# Patient Record
Sex: Female | Born: 1995 | Race: Black or African American | Hispanic: No | Marital: Single | Smoking: Never smoker
Health system: Southern US, Community
[De-identification: ages and names within clinical notes are randomized; demographics above are authoritative.]

## PROBLEM LIST (undated history)

## (undated) DIAGNOSIS — K219 Gastro-esophageal reflux disease without esophagitis: Secondary | ICD-10-CM

## (undated) DIAGNOSIS — F419 Anxiety disorder, unspecified: Secondary | ICD-10-CM

## (undated) HISTORY — DX: Gastro-esophageal reflux disease without esophagitis: K21.9

---
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
## 2018-08-22 ENCOUNTER — Other Ambulatory Visit (HOSPITAL_COMMUNITY)
Admission: RE | Admit: 2018-08-22 | Discharge: 2018-08-22 | Disposition: A | Discharge status: Home / Self Care | Payer: Medicaid Other | Source: Ambulatory Visit | Attending: Obstetrics and Gynecology | Admitting: Obstetrics and Gynecology

## 2018-08-22 ENCOUNTER — Other Ambulatory Visit: Payer: Self-pay | Admitting: Obstetrics and Gynecology

## 2018-08-22 DIAGNOSIS — Z01419 Encounter for gynecological examination (general) (routine) without abnormal findings: Secondary | ICD-10-CM | POA: Diagnosis not present

## 2018-08-24 LAB — CYTOLOGY - PAP
Adequacy: ABSENT
Diagnosis: NEGATIVE

## 2020-09-26 ENCOUNTER — Other Ambulatory Visit: Payer: Self-pay | Admitting: Family Medicine

## 2020-09-26 ENCOUNTER — Ambulatory Visit
Admission: RE | Admit: 2020-09-26 | Discharge: 2020-09-26 | Disposition: A | Discharge status: Home / Self Care | Payer: Medicaid Other | Source: Ambulatory Visit | Attending: Family Medicine | Admitting: Family Medicine

## 2020-09-26 DIAGNOSIS — M25572 Pain in left ankle and joints of left foot: Secondary | ICD-10-CM

## 2020-10-13 ENCOUNTER — Ambulatory Visit: Payer: Medicaid Other

## 2020-10-13 ENCOUNTER — Telehealth: Payer: Self-pay | Admitting: Podiatry

## 2020-10-13 ENCOUNTER — Encounter: Payer: Self-pay | Admitting: Podiatry

## 2020-10-13 ENCOUNTER — Ambulatory Visit (INDEPENDENT_AMBULATORY_CARE_PROVIDER_SITE_OTHER): Payer: Medicaid Other

## 2020-10-13 ENCOUNTER — Other Ambulatory Visit: Payer: Self-pay

## 2020-10-13 ENCOUNTER — Ambulatory Visit (INDEPENDENT_AMBULATORY_CARE_PROVIDER_SITE_OTHER): Payer: Medicaid Other | Admitting: Podiatry

## 2020-10-13 DIAGNOSIS — M2142 Flat foot [pes planus] (acquired), left foot: Secondary | ICD-10-CM

## 2020-10-13 DIAGNOSIS — Q6689 Other  specified congenital deformities of feet: Secondary | ICD-10-CM | POA: Diagnosis not present

## 2020-10-13 DIAGNOSIS — M2141 Flat foot [pes planus] (acquired), right foot: Secondary | ICD-10-CM | POA: Diagnosis not present

## 2020-10-13 DIAGNOSIS — M7750 Other enthesopathy of unspecified foot: Secondary | ICD-10-CM

## 2020-10-13 DIAGNOSIS — M7752 Other enthesopathy of left foot: Secondary | ICD-10-CM | POA: Diagnosis not present

## 2020-10-13 NOTE — Progress Notes (Signed)
  Subjective:  Patient ID: Carol Schwartz, female    DOB: 1995/07/25,  MRN: [id number]  Chief Complaint  Patient presents with  . Foot Pain      (xrays)(np) left foot/ankle pain (unknown cause)    25 y.o. female presents with the above complaint. History confirmed with patient.  She works on her feet as a Conservation officer, nature.  Started the end of January and the started to bother her.  She describes the pain as a throbbing aching pain that is diffusely and vaguely around the hindfoot.  Hurts when walking.  Objective:  Physical Exam: warm, good capillary refill, no trophic changes or ulcerative lesions, normal DP and PT pulses and normal sensory exam. Left Foot: Vague pain around subtalar joint, she has restricted range of motion of the subtalar joint inversion and eversion no peroneal spasm noted, pes planus deformity, there is prominence of the anterior calcaneal process Right Foot: Pes planus deformity no pain   Radiographs: Weightbearing x-ray of the left foot taken today as well as recent ankle radiographs on 09/26/2020 nonweightbearing:, She has pes planus deformity, unable to visualize subtalar joint clearly in any view Assessment:   1. Coalition, talocalcaneal   2. Pes planus of both feet      Plan:  Patient was evaluated and treated and all questions answered.  Discussed that she has pes planus deformity and that this on its own is not always an issue and rarely requires treatment unless pain or functional limitations occur.  I discussed with her that with the limited range of motion and abnormalities on her x-ray that I cannot visualize her subtalar joint that my chief concern would be for a talocalcaneal coalition that is beginning to become symptomatic.  I recommend we order an MRI in order to evaluate this.  If this is negative for coalition then we will consider first-line nonsurgical treatment for pes planus deformity and pain.  Return in about 1 month (around 11/12/2020) for after MRI to  review.

## 2020-10-13 NOTE — Telephone Encounter (Signed)
Order changed, thank you. She should be able to schedule now

## 2020-10-13 NOTE — Telephone Encounter (Signed)
Patient states that when she went to get her MRI that she was told by [facility] that the order was written for her right ankle when it should've been her left. Please rewrite and resend.

## 2020-10-14 ENCOUNTER — Telehealth: Payer: Self-pay | Admitting: Family Medicine

## 2020-10-14 NOTE — Telephone Encounter (Signed)
[facility] called stating they couldn't get in contact with patient, stated the patient had number blocked, called patient informed patient to return call to get scheduled

## 2020-10-18 ENCOUNTER — Ambulatory Visit
Admission: RE | Admit: 2020-10-18 | Discharge: 2020-10-18 | Disposition: A | Discharge status: Home / Self Care | Payer: Medicaid Other | Source: Ambulatory Visit | Attending: Podiatry | Admitting: Podiatry

## 2020-10-18 ENCOUNTER — Other Ambulatory Visit: Payer: Self-pay

## 2020-10-18 DIAGNOSIS — M2142 Flat foot [pes planus] (acquired), left foot: Secondary | ICD-10-CM

## 2020-10-18 DIAGNOSIS — Q6689 Other  specified congenital deformities of feet: Secondary | ICD-10-CM

## 2020-10-18 DIAGNOSIS — M2141 Flat foot [pes planus] (acquired), right foot: Secondary | ICD-10-CM

## 2020-10-20 ENCOUNTER — Ambulatory Visit: Payer: Medicaid Other | Admitting: Podiatry

## 2020-10-20 NOTE — Telephone Encounter (Signed)
I was out of the office last week, and I will inform the patient. Thank you. 

## 2020-11-13 ENCOUNTER — Other Ambulatory Visit: Payer: Self-pay

## 2020-11-13 ENCOUNTER — Ambulatory Visit: Payer: Medicaid Other | Admitting: Podiatry

## 2020-11-13 DIAGNOSIS — M7662 Achilles tendinitis, left leg: Secondary | ICD-10-CM

## 2020-11-13 DIAGNOSIS — N946 Dysmenorrhea, unspecified: Secondary | ICD-10-CM | POA: Insufficient documentation

## 2020-11-13 DIAGNOSIS — M2141 Flat foot [pes planus] (acquired), right foot: Secondary | ICD-10-CM | POA: Diagnosis not present

## 2020-11-13 DIAGNOSIS — N83209 Unspecified ovarian cyst, unspecified side: Secondary | ICD-10-CM | POA: Insufficient documentation

## 2020-11-13 DIAGNOSIS — M2142 Flat foot [pes planus] (acquired), left foot: Secondary | ICD-10-CM | POA: Diagnosis not present

## 2020-11-13 DIAGNOSIS — Q688 Other specified congenital musculoskeletal deformities: Secondary | ICD-10-CM

## 2020-11-13 MED ORDER — MELOXICAM 15 MG PO TABS: 15.0000 mg | ORAL_TABLET | Freq: Every day | ORAL | 2 refills | Status: DC

## 2020-11-13 NOTE — Patient Instructions (Signed)

## 2020-11-17 ENCOUNTER — Encounter: Payer: Self-pay | Admitting: Podiatry

## 2020-11-17 NOTE — Progress Notes (Signed)
  Subjective:  Patient ID: Carol Schwartz, female    DOB: Jul 03, 1996,  MRN: [id number]  Chief Complaint  Patient presents with  . Flat Foot  . Foot Pain    MRI results, left ankle    25 y.o. female returns for follow-up with the above complaint. History confirmed with patient.  She is having pain in the posterior ankle and around the back of the heel  Objective:  Physical Exam: warm, good capillary refill, no trophic changes or ulcerative lesions, normal DP and PT pulses and normal sensory exam. Left Foot: Pain palpation to the insertion of the Achilles tendon, she has mild pain in the posterior ankle joint as well Right Foot: Pes planus deformity no pain   Radiographs: Weightbearing x-ray of the left foot taken today as well as recent ankle radiographs on 09/26/2020 nonweightbearing:, She has pes planus deformity, unable to visualize subtalar joint clearly in any view  Study Result  Narrative & Impression  CLINICAL DATA:  Left ankle pain for approximately 2 months. No known injury.  EXAM: MRI OF THE LEFT ANKLE WITHOUT CONTRAST  TECHNIQUE: Multiplanar, multisequence MR imaging of the ankle was performed. No intravenous contrast was administered.  COMPARISON:  Plain films left foot 10/13/2020.  FINDINGS: TENDONS  Peroneal: Intact.  Posteromedial: Intact.  Anterior: Intact.  Achilles: Intact. Small focus of intrasubstance increased T2 signal in the distal tendon consistent with tendinosis noted.  Plantar Fascia: Intact.  No evidence of plantar fasciitis.  LIGAMENTS  Lateral: The anterior talofibular ligament is completely torn. Lateral ligaments are otherwise intact.  Medial: Intact.  CARTILAGE  Ankle Joint: Normal. No joint effusion or osteochondral lesion of the talar dome.  Subtalar Joints/Sinus Tarsi: Normal.  Bones: The patient has a small os trigonum but there is fairly marked marrow edema about the synchondrosis. Trace amount of  fluid is seen in the posterior subtalar recess. No acute bony or joint abnormality is identified.  Other: None.  No fluid collection or mass.  IMPRESSION: Findings consistent with os trigonum syndrome.  Complete ATFL tear appears chronic.  Very mild Achilles tendinosis without tear.   Electronically Signed   By: Drusilla Kanner M.D.   On: 10/20/2020 11:25    Assessment:   1. Pes planus of both feet   2. Achilles tendinitis, left leg   3. Os trigonum syndrome      Plan:  Patient was evaluated and treated and all questions answered.  Reviewed the findings of the MRI with the patient.  I discussed with her that the majority of her symptoms I think likely are the Achilles tendinitis smothering her.  She has some vague pain in the posterior ankle joint which could be os trigonum syndrome.  I recommend immobilization in a CAM boot and a prescription for this was written for [facility].  Stretching exercises reviewed.  Meloxicam prescription sent.  I think it will take care of the os trigonum syndrome as well.  If still bothersome could consider excision.  Return in about 6 weeks (around 12/25/2020) for achilles tendonitis.

## 2020-12-25 ENCOUNTER — Encounter: Payer: Self-pay | Admitting: Podiatry

## 2020-12-25 ENCOUNTER — Ambulatory Visit (INDEPENDENT_AMBULATORY_CARE_PROVIDER_SITE_OTHER): Payer: Medicaid Other | Admitting: Podiatry

## 2020-12-25 ENCOUNTER — Other Ambulatory Visit: Payer: Self-pay

## 2020-12-25 DIAGNOSIS — Q688 Other specified congenital musculoskeletal deformities: Secondary | ICD-10-CM | POA: Diagnosis not present

## 2020-12-25 DIAGNOSIS — M7662 Achilles tendinitis, left leg: Secondary | ICD-10-CM

## 2020-12-25 NOTE — Progress Notes (Signed)
  Subjective:  Patient ID: Carol Schwartz, female    DOB: 1996/07/07,  MRN: [id number]  Chief Complaint  Patient presents with   Tendonitis     Return in about 1 month (around 11/12/2020) for after MRI to review    25 y.o. female returns for follow-up with the above complaint. History confirmed with patient.  She has been in the CAM boot is helped quite a bit she longer has any pain  Objective:  Physical Exam: warm, good capillary refill, no trophic changes or ulcerative lesions, normal DP and PT pulses and normal sensory exam. Left Foot: No pain with manipulation or palpation along the Achilles tendon or in the posterior ankle joint  Right Foot: Pes planus deformity no pain   Radiographs: Weightbearing x-ray of the left foot taken today as well as recent ankle radiographs on 09/26/2020 nonweightbearing:, She has pes planus deformity, unable to visualize subtalar joint clearly in any view  Study Result  Narrative & Impression  CLINICAL DATA:  Left ankle pain for approximately 2 months. No known injury.   EXAM: MRI OF THE LEFT ANKLE WITHOUT CONTRAST   TECHNIQUE: Multiplanar, multisequence MR imaging of the ankle was performed. No intravenous contrast was administered.   COMPARISON:  Plain films left foot 10/13/2020.   FINDINGS: TENDONS   Peroneal: Intact.   Posteromedial: Intact.   Anterior: Intact.   Achilles: Intact. Small focus of intrasubstance increased T2 signal in the distal tendon consistent with tendinosis noted.   Plantar Fascia: Intact.  No evidence of plantar fasciitis.   LIGAMENTS   Lateral: The anterior talofibular ligament is completely torn. Lateral ligaments are otherwise intact.   Medial: Intact.   CARTILAGE   Ankle Joint: Normal. No joint effusion or osteochondral lesion of the talar dome.   Subtalar Joints/Sinus Tarsi: Normal.   Bones: The patient has a small os trigonum but there is fairly marked marrow edema about the synchondrosis.  Trace amount of fluid is seen in the posterior subtalar recess. No acute bony or joint abnormality is identified.   Other: None.  No fluid collection or mass.   IMPRESSION: Findings consistent with os trigonum syndrome.   Complete ATFL tear appears chronic.   Very mild Achilles tendinosis without tear.     Electronically Signed   By: Drusilla Kanner M.D.   On: 10/20/2020 11:25    Assessment:   No diagnosis found.    Plan:  Patient was evaluated and treated and all questions answered.  Overall doing well after period of mobilization and meloxicam.  Continue exercises for Achilles until completely pain-free.  Return as needed.  Discussed that if the pain in the posterior ankle joint returned then excision of the os trigonum would be treatment  Return if symptoms worsen or fail to improve.

## 2021-02-17 ENCOUNTER — Ambulatory Visit (INDEPENDENT_AMBULATORY_CARE_PROVIDER_SITE_OTHER): Payer: Medicaid Other | Admitting: Podiatry

## 2021-02-17 ENCOUNTER — Other Ambulatory Visit: Payer: Self-pay

## 2021-02-17 DIAGNOSIS — M216X1 Other acquired deformities of right foot: Secondary | ICD-10-CM | POA: Diagnosis not present

## 2021-02-17 DIAGNOSIS — M216X2 Other acquired deformities of left foot: Secondary | ICD-10-CM | POA: Diagnosis not present

## 2021-02-17 DIAGNOSIS — M778 Other enthesopathies, not elsewhere classified: Secondary | ICD-10-CM

## 2021-02-17 NOTE — Progress Notes (Signed)
  Subjective:  Patient ID: Carol Schwartz, female    DOB: Oct 25, 1995,  MRN: [id number]  Chief Complaint  Patient presents with   Tendonitis    NEEDS MEDICAID ABN FOR DME right foot/ankle pain follow up    25 y.o. female returns for follow-up with the above complaint. History confirmed with patient.  Left foot is not bothering her right lower extremity now is having some pain toward the top of the foot  Objective:  Physical Exam: warm, good capillary refill, no trophic changes or ulcerative lesions, normal DP and PT pulses and normal sensory exam.  No reproducible pain with range of motion or palpation today.  She does have significant equinus bilateral   Radiographs: Weightbearing x-ray of the left foot taken today as well as recent ankle radiographs on 09/26/2020 nonweightbearing:, She has pes planus deformity, unable to visualize subtalar joint clearly in any view  Study Result  Narrative & Impression  CLINICAL DATA:  Left ankle pain for approximately 2 months. No known injury.   EXAM: MRI OF THE LEFT ANKLE WITHOUT CONTRAST   TECHNIQUE: Multiplanar, multisequence MR imaging of the ankle was performed. No intravenous contrast was administered.   COMPARISON:  Plain films left foot 10/13/2020.   FINDINGS: TENDONS   Peroneal: Intact.   Posteromedial: Intact.   Anterior: Intact.   Achilles: Intact. Small focus of intrasubstance increased T2 signal in the distal tendon consistent with tendinosis noted.   Plantar Fascia: Intact.  No evidence of plantar fasciitis.   LIGAMENTS   Lateral: The anterior talofibular ligament is completely torn. Lateral ligaments are otherwise intact.   Medial: Intact.   CARTILAGE   Ankle Joint: Normal. No joint effusion or osteochondral lesion of the talar dome.   Subtalar Joints/Sinus Tarsi: Normal.   Bones: The patient has a small os trigonum but there is fairly marked marrow edema about the synchondrosis. Trace amount of fluid is  seen in the posterior subtalar recess. No acute bony or joint abnormality is identified.   Other: None.  No fluid collection or mass.   IMPRESSION: Findings consistent with os trigonum syndrome.   Complete ATFL tear appears chronic.   Very mild Achilles tendinosis without tear.     Electronically Signed   By: Drusilla Kanner M.D.   On: 10/20/2020 11:25    Assessment:   1. Acquired equinus deformity of both feet   2. Extensor tendonitis of foot       Plan:  Patient was evaluated and treated and all questions answered.  Overall has improved she still has some residual tightness mostly on the right foot and compensatory extensor tendinitis of the right lower extremity.  I recommended stretching for her significant equinus deformity with physical therapy.  She will follow-up as needed after this  Return if symptoms worsen or fail to improve.

## 2021-02-17 NOTE — Patient Instructions (Signed)
Insert brands I like: PowerStep (we sell these), Superfeet Protalus, Aetrex

## 2021-03-17 ENCOUNTER — Ambulatory Visit: Payer: Medicaid Other

## 2021-03-26 ENCOUNTER — Ambulatory Visit: Payer: Medicaid Other | Admitting: Physical Therapy

## 2021-04-13 ENCOUNTER — Ambulatory Visit: Payer: Medicaid Other | Admitting: Physical Therapy

## 2021-05-08 ENCOUNTER — Other Ambulatory Visit: Payer: Self-pay

## 2021-05-08 ENCOUNTER — Encounter (HOSPITAL_COMMUNITY): Payer: Self-pay | Admitting: Emergency Medicine

## 2021-05-08 ENCOUNTER — Emergency Department (HOSPITAL_COMMUNITY)
Admission: EM | Admit: 2021-05-08 | Discharge: 2021-05-08 | Disposition: A | Discharge status: Home / Self Care | Payer: Medicaid Other | Attending: Emergency Medicine | Admitting: Emergency Medicine

## 2021-05-08 ENCOUNTER — Emergency Department (HOSPITAL_COMMUNITY): Payer: Medicaid Other

## 2021-05-08 DIAGNOSIS — R002 Palpitations: Secondary | ICD-10-CM | POA: Insufficient documentation

## 2021-05-08 LAB — CBC WITH DIFFERENTIAL/PLATELET
Abs Immature Granulocytes: 0.05 10*3/uL (ref 0.00–0.07)
Basophils Absolute: 0 10*3/uL (ref 0.0–0.1)
Basophils Relative: 0 %
Eosinophils Absolute: 0 10*3/uL (ref 0.0–0.5)
Eosinophils Relative: 0 %
HCT: 42.1 % (ref 36.0–46.0)
Hemoglobin: 13.8 g/dL (ref 12.0–15.0)
Immature Granulocytes: 1 %
Lymphocytes Relative: 17 %
Lymphs Abs: 1.7 10*3/uL (ref 0.7–4.0)
MCH: 30.4 pg (ref 26.0–34.0)
MCHC: 32.8 g/dL (ref 30.0–36.0)
MCV: 92.7 fL (ref 80.0–100.0)
Monocytes Absolute: 0.9 10*3/uL (ref 0.1–1.0)
Monocytes Relative: 9 %
Neutro Abs: 7.1 10*3/uL (ref 1.7–7.7)
Neutrophils Relative %: 73 %
Platelets: 247 10*3/uL (ref 150–400)
RBC: 4.54 MIL/uL (ref 3.87–5.11)
RDW: 13.2 % (ref 11.5–15.5)
WBC: 9.7 10*3/uL (ref 4.0–10.5)
nRBC: 0 % (ref 0.0–0.2)

## 2021-05-08 LAB — BASIC METABOLIC PANEL
Anion gap: 14 (ref 5–15)
BUN: 7 mg/dL (ref 6–20)
CO2: 19 mmol/L — ABNORMAL LOW (ref 22–32)
Calcium: 9.2 mg/dL (ref 8.9–10.3)
Chloride: 104 mmol/L (ref 98–111)
Creatinine, Ser: 0.9 mg/dL (ref 0.44–1.00)
GFR, Estimated: >60 mL/min (ref >60)
Glucose, Bld: 98 mg/dL (ref 70–99)
Potassium: 3.7 mmol/L (ref 3.5–5.1)
Sodium: 137 mmol/L (ref 135–145)

## 2021-05-08 LAB — I-STAT BETA HCG BLOOD, ED (MC, WL, AP ONLY): I-stat hCG, quantitative: <5 m[IU]/mL (ref <5)

## 2021-05-08 LAB — D-DIMER, QUANTITATIVE: D-Dimer, Quant: 0.34 ug/mL-FEU (ref 0.00–0.50)

## 2021-05-08 LAB — TROPONIN I (HIGH SENSITIVITY): Troponin I (High Sensitivity): <2 ng/L (ref <18)

## 2021-05-08 LAB — PREGNANCY, URINE: Preg Test, Ur: NEGATIVE

## 2021-05-08 NOTE — ED Provider Notes (Signed)
Emergency Medicine Provider Triage Evaluation Note  Carol Schwartz , a 25 y.o. female  was evaluated in triage.  Pt complains of palpitations. States that she initially had palpitations on Wednesday which self-abated and returned this morning. States she woke up feeling short of breath and then began having a "funny sensation" in left chest. Denies lightheadedness or dizziness, however states she felt like she was going to pass out. Denies recent illnesses, prolonged travel, surgery, smoking, hemoptysis, history of blood clots.. Does use birth control.  Review of Systems  Positive: Shortness of breath, palpitations Negative: Fever, cough, lower extremity edema, syncope  Physical Exam  BP (!) 152/91 (BP Location: Left Arm)   Pulse (!) 115   Temp 98.6 F (37 C) (Oral)   Resp 16   SpO2 98%  Gen:   Awake, no distress   Resp:  Normal effort, CTA bilaterally MSK:   Moves extremities without difficulty  Other:  S1/S2. Tachycardia without irregular rhythm. No murmurs. Pulses 1+ bilaterally.   Medical Decision Making  Medically screening exam initiated at 9:15 AM.  Appropriate orders placed.  Daisey Caloca was informed that the remainder of the evaluation will be completed by another provider, this initial triage assessment does not replace that evaluation, and the importance of remaining in the ED until their evaluation is complete.     Cristopher Peru, PA-C 05/08/21 2620    Ernie Avena, MD 05/08/21 1048

## 2021-05-08 NOTE — Discharge Instructions (Addendum)
Your test today were normal your blood tests were normal and your heart tests were normal as well. Call your primary care doctor or specialist as discussed in the next 2-3 days.   Return immediately back to the ER if:  Your symptoms worsen within the next 12-24 hours. You develop new symptoms such as new fevers, persistent vomiting, new pain, shortness of breath, or new weakness or numbness, or if you have any other concerns.

## 2021-05-08 NOTE — ED Notes (Signed)
Pt stated she is having heart palpitations so I rechecked her vitals. Pt then asked me if she is going to et called back now. I informed pt not at this moment we are still waiting on bed to open in the back.  I will inform triage nurse.

## 2021-05-08 NOTE — ED Provider Notes (Signed)
[facility] EMERGENCY DEPARTMENT Provider Note   CSN: [id number] Arrival date & time: 05/08/21  6295     History Chief Complaint  Patient presents with   Palpitations    Carol Schwartz is a 25 y.o. female.  To ER chief complaint of palpitations.  She is been having off-and-on since 3 days ago.  States it last for anywhere from 20 minutes to about an hour at a time and then resolved spontaneously.  Denies any chest pain or discomfort denies any shortness of breath.  Complaining of generalized fatigue however.  No reports of headache or vomiting or diarrhea.  No abdominal pain reported.      Past Medical History:  Diagnosis Date   Anxiety     Patient Active Problem List   Diagnosis Date Noted   Ovarian cyst 11/13/2020   Dysmenorrhea 11/13/2020    Past Surgical History:  Procedure Laterality Date   LAPAROSCOPY  02/24/2012   Procedure: LAPAROSCOPY OPERATIVE;  Surgeon: Geryl Rankins, MD;  Location: [facility];  Service: Gynecology;  Laterality: N/A;  Drainage of Left Ovarian Cyst   OVARIAN CYST REMOVAL  02/24/2012   Procedure: OVARIAN CYSTECTOMY;  Surgeon: Geryl Rankins, MD;  Location: [facility];  Service: Gynecology;  Laterality: Right;     OB History   No obstetric history on file.     Family History  Problem Relation Age of Onset   Diabetes Father    Hypertension Other     Social History   Tobacco Use   Smoking status: Never  Substance Use Topics   Alcohol use: No   Drug use: No    Home Medications Prior to Admission medications   Medication Sig Start Date End Date Taking? Authorizing Provider  ibuprofen (ADVIL) 600 MG tablet Take 1,200 mg by mouth 2 (two) times daily as needed. 09/18/20   [provider]  meloxicam (MOBIC) 15 MG tablet Take 1 tablet (15 mg total) by mouth daily. 11/13/20   McDonald, Rachelle Hora, DPM  norethindrone (AYGESTIN) 5 MG tablet Take 5 mg by mouth daily. 10/29/20   [provider]  norgestimate-ethinyl  estradiol (SPRINTEC 28) 0.25-35 MG-MCG tablet Take 1 tablet by mouth daily.    [provider]  norgestimate-ethinyl estradiol (SPRINTEC 28) 0.25-35 MG-MCG tablet 1 tablet    [provider]    Allergies    Patient has no known allergies.  Review of Systems   Review of Systems  Constitutional:  Negative for fever.  HENT:  Negative for ear pain.   Eyes:  Negative for pain.  Respiratory:  Negative for cough.   Cardiovascular:  Positive for palpitations. Negative for chest pain.  Gastrointestinal:  Negative for abdominal pain.  Genitourinary:  Negative for flank pain.  Musculoskeletal:  Negative for back pain.  Skin:  Negative for rash.  Neurological:  Negative for headaches.   Physical Exam Updated Vital Signs BP 120/89   Pulse (!) 105   Temp 98.8 F (37.1 C) (Oral)   Resp 16   SpO2 99%   Physical Exam Constitutional:      General: She is not in acute distress.    Appearance: Normal appearance.  HENT:     Head: Normocephalic.     Nose: Nose normal.  Eyes:     Extraocular Movements: Extraocular movements intact.  Cardiovascular:     Rate and Rhythm: Tachycardia present.  Pulmonary:     Effort: Pulmonary effort is normal.  Musculoskeletal:  General: Normal range of motion.     Cervical back: Normal range of motion.     Right lower leg: No edema.     Left lower leg: No edema.  Neurological:     General: No focal deficit present.     Mental Status: She is alert. Mental status is at baseline.    ED Results / Procedures / Treatments   Labs (all labs ordered are listed, but only abnormal results are displayed) Labs Reviewed  BASIC METABOLIC PANEL - Abnormal; Notable for the following components:      Result Value   CO2 19 (*)    All other components within normal limits  CBC WITH DIFFERENTIAL/PLATELET  PREGNANCY, URINE  D-DIMER, QUANTITATIVE  I-STAT BETA HCG BLOOD, ED ([facility], [facility], [facility] ONLY)  CBG MONITORING, ED  TROPONIN I (HIGH SENSITIVITY)     EKG EKG Interpretation  Date/Time:  Friday May 08 2021 09:02:06 EDT Ventricular Rate:  109 PR Interval:  140 QRS Duration: 78 QT Interval:  322 QTC Calculation: 433 R Axis:   60 Text Interpretation: Sinus tachycardia Nonspecific T wave abnormality Abnormal ECG Confirmed by Norman Clay ([id number]) on 05/08/2021 4:46:12 PM  Radiology DG Chest 2 View  Result Date: 05/08/2021 CLINICAL DATA:  Shortness of breath, palpitations EXAM: CHEST - 2 VIEW COMPARISON:  None. FINDINGS: The heart size and mediastinal contours are within normal limits. Both lungs are clear. The visualized skeletal structures are unremarkable. IMPRESSION: No active cardiopulmonary disease. Reading location: [place], [place]. Electronically Signed   By: Ernie Avena M.D.   On: 05/08/2021 09:35    Procedures Procedures   Medications Ordered in ED Medications - No data to display  ED Course  I have reviewed the triage vital signs and the nursing notes.  Pertinent labs & imaging results that were available during my care of the patient were reviewed by me and considered in my medical decision making (see chart for details).    MDM Rules/Calculators/A&P                           Patient is tachycardic at 100 bpm, EKG otherwise shows sinus rhythm mild tachycardia no ST elevations, no ST depressions noted.  Labs unremarkable.  No evidence of infection or anemia.  Pregnancy test is negative troponin is negative D-dimer is normal as well.  Etiology of her symptoms unclear, recommended outpatient follow-up with her doctor within the week.  Recommending immediate return for pain difficulty breathing or worsening symptoms.  Final Clinical Impression(s) / ED Diagnoses Final diagnoses:  Palpitations    Rx / DC Orders ED Discharge Orders     None        Cheryll Cockayne, MD 05/08/21 [phone]

## 2021-05-08 NOTE — ED Triage Notes (Signed)
Patient BIB [facility] for chest palpitations that started this morning. On arrival to patient's home patient was hyperventilating, has been evaluated for anxiety previously this week. Patient alert, oriented, and in no apparent distress at this time.

## 2021-05-12 ENCOUNTER — Ambulatory Visit: Payer: Medicaid Other

## 2021-05-23 ENCOUNTER — Emergency Department (HOSPITAL_COMMUNITY)
Admission: EM | Admit: 2021-05-23 | Discharge: 2021-05-23 | Disposition: A | Discharge status: Home / Self Care | Payer: Medicaid Other | Attending: Emergency Medicine | Admitting: Emergency Medicine

## 2021-05-23 ENCOUNTER — Other Ambulatory Visit: Payer: Self-pay

## 2021-05-23 ENCOUNTER — Emergency Department (HOSPITAL_COMMUNITY): Payer: Medicaid Other

## 2021-05-23 ENCOUNTER — Encounter (HOSPITAL_COMMUNITY): Payer: Self-pay | Admitting: Emergency Medicine

## 2021-05-23 DIAGNOSIS — R002 Palpitations: Secondary | ICD-10-CM | POA: Diagnosis present

## 2021-05-23 DIAGNOSIS — F411 Generalized anxiety disorder: Secondary | ICD-10-CM

## 2021-05-23 DIAGNOSIS — R55 Syncope and collapse: Secondary | ICD-10-CM | POA: Insufficient documentation

## 2021-05-23 DIAGNOSIS — N9489 Other specified conditions associated with female genital organs and menstrual cycle: Secondary | ICD-10-CM | POA: Diagnosis not present

## 2021-05-23 DIAGNOSIS — F419 Anxiety disorder, unspecified: Secondary | ICD-10-CM | POA: Diagnosis not present

## 2021-05-23 DIAGNOSIS — R0789 Other chest pain: Secondary | ICD-10-CM | POA: Diagnosis not present

## 2021-05-23 LAB — BASIC METABOLIC PANEL
Anion gap: 8 (ref 5–15)
BUN: 8 mg/dL (ref 6–20)
CO2: 21 mmol/L — ABNORMAL LOW (ref 22–32)
Calcium: 9.1 mg/dL (ref 8.9–10.3)
Chloride: 109 mmol/L (ref 98–111)
Creatinine, Ser: 0.85 mg/dL (ref 0.44–1.00)
GFR, Estimated: >60 mL/min (ref >60)
Glucose, Bld: 87 mg/dL (ref 70–99)
Potassium: 4.1 mmol/L (ref 3.5–5.1)
Sodium: 138 mmol/L (ref 135–145)

## 2021-05-23 LAB — TROPONIN I (HIGH SENSITIVITY)
Troponin I (High Sensitivity): 3 ng/L (ref <18)
Troponin I (High Sensitivity): <2 ng/L (ref <18)

## 2021-05-23 LAB — CBC
HCT: 45.2 % (ref 36.0–46.0)
Hemoglobin: 14.5 g/dL (ref 12.0–15.0)
MCH: 30.7 pg (ref 26.0–34.0)
MCHC: 32.1 g/dL (ref 30.0–36.0)
MCV: 95.6 fL (ref 80.0–100.0)
Platelets: 216 10*3/uL (ref 150–400)
RBC: 4.73 MIL/uL (ref 3.87–5.11)
RDW: 13.3 % (ref 11.5–15.5)
WBC: 7.9 10*3/uL (ref 4.0–10.5)
nRBC: 0 % (ref 0.0–0.2)

## 2021-05-23 LAB — I-STAT BETA HCG BLOOD, ED (MC, WL, AP ONLY): I-stat hCG, quantitative: <5 m[IU]/mL (ref <5)

## 2021-05-23 MED ORDER — HYDROXYZINE HCL 25 MG PO TABS: 25.0000 mg | ORAL_TABLET | Freq: Three times a day (TID) | ORAL | 0 refills | Status: DC | PRN

## 2021-05-23 NOTE — ED Notes (Signed)
E-signature pad unavailable at time of pt discharge. This RN discussed discharge materials with pt and answered all pt questions. Pt stated understanding of discharge material. ? ?

## 2021-05-23 NOTE — Discharge Instructions (Addendum)
Please follow up with cardiologist as scheduled

## 2021-05-23 NOTE — ED Provider Notes (Signed)
Emergency Medicine Provider Triage Evaluation Note  Carol Schwartz , a 25 y.o. female  was evaluated in triage.  Pt complains of chest pain, shortness of breath, without nausea, vomiting. Non-exertional. Family hx acs. Anxiety and hx of similar symptoms. Reports despite this her biggest concern is heart attack and she would like to be worked up again. Intermittent chest pain at time of eval.  Review of Systems  Positive: As above Negative: As above  Physical Exam  BP 126/72 (BP Location: Right Arm)   Pulse 100   Temp 97.9 F (36.6 C) (Oral)   Resp 15   SpO2 99%  Gen:   Awake, no distress   Resp:  Normal effort  MSK:   Moves extremities without difficulty  Other:  No ttp abdomen  Medical Decision Making  Medically screening exam initiated at 2:45 PM.  Appropriate orders placed.  Carol Schwartz was informed that the remainder of the evaluation will be completed by another provider, this initial triage assessment does not replace that evaluation, and the importance of remaining in the ED until their evaluation is complete.  Chest pain, SOB, hx of anxiety / panic attack   Olene Floss, PA-C 05/23/21 1446    Gloris Manchester, MD 05/24/21 [phone]

## 2021-05-23 NOTE — ED Provider Notes (Signed)
[facility] EMERGENCY DEPARTMENT Provider Note   CSN: [id number] Arrival date & time: 05/23/21  1331     History Chief Complaint  Patient presents with   Anxiety    Carol Schwartz is a 25 y.o. female with a history of obesity, dysmenorrhea, presenting to emergency department with complaint of chest tightness, nausea, near syncope.  The patient reports has been dealing with the symptoms intermittently for several weeks.  She describes episodes where her chest feels tight, she has palpitations, her head is pounding, she feels like she got a pass out.  These can occur at any given time.  They are not associate with activity.  She will spend a lot of time at home.  She reports that she has been seen in the ED for this prior, per medical record she was seen at the end of October for similar symptoms.  At that time she had troponins and a negative D-dimer.  She says she has also called EMS out to the house several times, was told this is likely anxiety.  She does have a cardiology appointment arranged this coming month.  She denies any known history of diabetes, hypertension, hyperlipidemia, smoking, illicit drug use.  She reports her father had an MI in his 46's, no other sig family hx reported.  She reports she does take oral birth control.  She denies any history of DVT or PE.  She denies any history of clotting disorders.  HPI     Past Medical History:  Diagnosis Date   Anxiety     Patient Active Problem List   Diagnosis Date Noted   Ovarian cyst 11/13/2020   Dysmenorrhea 11/13/2020    Past Surgical History:  Procedure Laterality Date   LAPAROSCOPY  02/24/2012   Procedure: LAPAROSCOPY OPERATIVE;  Surgeon: Geryl Rankins, MD;  Location: [facility];  Service: Gynecology;  Laterality: N/A;  Drainage of Left Ovarian Cyst   OVARIAN CYST REMOVAL  02/24/2012   Procedure: OVARIAN CYSTECTOMY;  Surgeon: Geryl Rankins, MD;  Location: [facility];  Service: Gynecology;   Laterality: Right;     OB History   No obstetric history on file.     Family History  Problem Relation Age of Onset   Diabetes Father    Hypertension Other     Social History   Tobacco Use   Smoking status: Never   Smokeless tobacco: Never  Substance Use Topics   Alcohol use: No   Drug use: No    Home Medications Prior to Admission medications   Medication Sig Start Date End Date Taking? Authorizing Provider  hydrOXYzine (ATARAX/VISTARIL) 25 MG tablet Take 1 tablet (25 mg total) by mouth every 8 (eight) hours as needed for up to 30 doses for anxiety. 05/23/21  Yes Terald Sleeper, MD  ibuprofen (ADVIL) 600 MG tablet Take 1,200 mg by mouth 2 (two) times daily as needed. 09/18/20   [provider]  meloxicam (MOBIC) 15 MG tablet Take 1 tablet (15 mg total) by mouth daily. 11/13/20   McDonald, Rachelle Hora, DPM  norethindrone (AYGESTIN) 5 MG tablet Take 5 mg by mouth daily. 10/29/20   [provider]  norgestimate-ethinyl estradiol (SPRINTEC 28) 0.25-35 MG-MCG tablet Take 1 tablet by mouth daily.    [provider]  norgestimate-ethinyl estradiol (SPRINTEC 28) 0.25-35 MG-MCG tablet 1 tablet    [provider]    Allergies    Patient has no known allergies.  Review of Systems   Review of Systems  Constitutional:  Negative for chills and fever.  Eyes:  Negative for pain and visual disturbance.  Respiratory:  Positive for shortness of breath. Negative for cough.   Cardiovascular:  Positive for palpitations. Negative for chest pain.  Gastrointestinal:  Negative for abdominal pain and vomiting.  Genitourinary:  Negative for dysuria and hematuria.  Musculoskeletal:  Negative for arthralgias and back pain.  Skin:  Negative for color change and rash.  Neurological:  Positive for light-headedness and headaches. Negative for syncope and facial asymmetry.  All other systems reviewed and are negative.  Physical Exam Updated Vital Signs BP 127/82    Pulse 88   Temp 97.9 F (36.6 C) (Oral)   Resp 18   SpO2 100%   Physical Exam Constitutional:      General: She is not in acute distress.    Appearance: She is obese.  HENT:     Head: Normocephalic and atraumatic.  Eyes:     Conjunctiva/sclera: Conjunctivae normal.     Pupils: Pupils are equal, round, and reactive to light.  Cardiovascular:     Rate and Rhythm: Normal rate and regular rhythm.  Pulmonary:     Effort: Pulmonary effort is normal. No respiratory distress.  Abdominal:     General: There is no distension.     Tenderness: There is no abdominal tenderness.  Skin:    General: Skin is warm and dry.  Neurological:     General: No focal deficit present.     Mental Status: She is alert. Mental status is at baseline.  Psychiatric:        Mood and Affect: Mood normal.        Behavior: Behavior normal.    ED Results / Procedures / Treatments   Labs (all labs ordered are listed, but only abnormal results are displayed) Labs Reviewed  BASIC METABOLIC PANEL - Abnormal; Notable for the following components:      Result Value   CO2 21 (*)    All other components within normal limits  CBC  I-STAT BETA HCG BLOOD, ED ([facility], [facility], [facility] ONLY)  TROPONIN I (HIGH SENSITIVITY)  TROPONIN I (HIGH SENSITIVITY)    EKG EKG Interpretation  Date/Time:  Saturday May 23 2021 14:47:08 EST Ventricular Rate:  92 PR Interval:  144 QRS Duration: 76 QT Interval:  338 QTC Calculation: 417 R Axis:   62 Text Interpretation: Normal sinus rhythm Nonspecific T wave abnormality , seen on prior ecg Oct 2022, no sig changes since last tracing, no STEMI Confirmed by Octaviano Glow [id number]) on 05/23/2021 10:04:21 PM  Radiology DG Chest 2 View  Result Date: 05/23/2021 CLINICAL DATA:  Chest pain. EXAM: CHEST - 2 VIEW COMPARISON:  Chest x-ray 05/08/2021. FINDINGS: The heart size and mediastinal contours are within normal limits. Both lungs are clear. The visualized skeletal structures are  unremarkable. IMPRESSION: No active cardiopulmonary disease. Electronically Signed   By: Ronney Asters M.D.   On: 05/23/2021 15:30    Procedures Procedures   Medications Ordered in ED Medications - No data to display  ED Course  I have reviewed the triage vital signs and the nursing notes.  Pertinent labs & imaging results that were available during my care of the patient were reviewed by me and considered in my medical decision making (see chart for details).  Differential diagnosis includes anxieties and panic attacks most likely given this constellation of symptoms versus infection including pneumonia versus arrhythmia versus other. I personally interpreted and reviewed the patient's imaging, EKG, and  blood test.  These are unremarkable.  No evidence of arrhythmia or acute coronary syndrome on the patient's EKG.  She is also extremely low risk per heart score (<3) with her symptoms.  Has cardiology f/u.  Rare single episode seem to be associated with these likely panic attacks.  We discussed beginning Atarax which may help with the symptoms.  I personally reviewed her prior medical records including her last ED visit, and she had a negative D-dimer test at that time.  I have a lower suspicion for pulmonary embolism, given that the symptoms are intermittent, no association with activity, and no hypoxia or tachycardia here.  No evidence of infection on her chest x-ray.  Okay for discharge     Final Clinical Impression(s) / ED Diagnoses Final diagnoses:  Anxiety state  Palpitations    Rx / DC Orders ED Discharge Orders          Ordered    hydrOXYzine (ATARAX/VISTARIL) 25 MG tablet  Every 8 hours PRN        05/23/21 2222             Wyvonnia Dusky, MD 05/23/21 2226

## 2021-05-23 NOTE — ED Triage Notes (Addendum)
Pt to triage via [facility] from home.  Reports anxiety since waking up at 8am.  She has been seen by PCP for same.  Doesn't take any meds for anxiety.  Denies chest pain.

## 2021-06-01 ENCOUNTER — Other Ambulatory Visit: Payer: Self-pay

## 2021-06-01 ENCOUNTER — Ambulatory Visit: Payer: Medicaid Other | Admitting: Cardiology

## 2021-06-01 ENCOUNTER — Encounter: Payer: Self-pay | Admitting: Cardiology

## 2021-06-01 VITALS — BP 137/87 | HR 102 | Temp 98.4°F | Resp 17 | Ht 68.5 in | Wt 278.0 lb

## 2021-06-01 DIAGNOSIS — R072 Precordial pain: Secondary | ICD-10-CM

## 2021-06-01 DIAGNOSIS — R0602 Shortness of breath: Secondary | ICD-10-CM

## 2021-06-01 DIAGNOSIS — R002 Palpitations: Secondary | ICD-10-CM

## 2021-06-01 NOTE — Progress Notes (Signed)
Date:  06/01/2021   ID:  Carol Schwartz, DOB 17-May-1996, MRN [id number]  PCP:  Medicine, [facility]  Cardiologist:  Rex Kras, DO, [facility] (established care 06/01/2021.)  REASON FOR CONSULT: Palpitations  REQUESTING PHYSICIAN:  Shanon Rosser, PA-C [address],  [place] [address]  Chief Complaint  Patient presents with   New Patient (Initial Visit)   Palpitations    HPI  Carol Schwartz is a 25 y.o. African-American female who presents to the office with a chief complaint of "palpitations." Patient's past medical history and cardiovascular risk factors include: obesity, dysmenorrhea, family hx of heart diseaes (Dad had MI in is 79s).    She is referred to the office at the request of Long, Nicki Reaper, PA-C for evaluation of palpitations.  Patient has been experiencing palpitations since October 2022 for which she has called EMS couple times and is also gone to the ED for evaluation.  Patient states that her symptoms are usually brought on when she wakes up in the morning.  They are present intermittently throughout the day.  No improving or worsening factors.  She denies any near-syncope or syncopal events.  She does not consume coffee, sodas, weight loss supplements, stimulants, energy drinks, herbal supplements.   No prior history of thyroid or anemia.  Associated symptoms at times include shortness of breath and chest tightness.  Chest pain occurs randomly, nonexertional, does not resolve with rest, self-limited.  During her ER visit she has had high sensitive troponins checked x2 which were negative, D-dimer negative.  FUNCTIONAL STATUS: No structured exercise program or daily routine.  ALLERGIES: No Known Allergies  MEDICATION LIST PRIOR TO VISIT: Current Meds  Medication Sig   hydrOXYzine (ATARAX/VISTARIL) 25 MG tablet Take 1 tablet (25 mg total) by mouth every 8 (eight) hours as needed for up to 30 doses for anxiety.   ibuprofen (ADVIL) 600 MG  tablet Take 1,200 mg by mouth 2 (two) times daily as needed.   norethindrone (AYGESTIN) 5 MG tablet Take 5 mg by mouth daily.     PAST MEDICAL HISTORY: Past Medical History:  Diagnosis Date   Anxiety     PAST SURGICAL HISTORY: Past Surgical History:  Procedure Laterality Date   LAPAROSCOPY  02/24/2012   Procedure: LAPAROSCOPY OPERATIVE;  Surgeon: Thurnell Lose, MD;  Location: [facility];  Service: Gynecology;  Laterality: N/A;  Drainage of Left Ovarian Cyst   OVARIAN CYST REMOVAL  02/24/2012   Procedure: OVARIAN CYSTECTOMY;  Surgeon: Thurnell Lose, MD;  Location: [facility];  Service: Gynecology;  Laterality: Right;    FAMILY HISTORY: The patient family history includes Diabetes in her father; Heart attack (age of onset: 20) in her father; Hypertension in an other family member.  SOCIAL HISTORY:  The patient  reports that she has never smoked. She has never used smokeless tobacco. She reports that she does not drink alcohol and does not use drugs.  REVIEW OF SYSTEMS: Review of Systems  Constitutional: Negative for chills and fever.  HENT:  Negative for hoarse voice and nosebleeds.   Eyes:  Negative for discharge, double vision and pain.  Cardiovascular:  Positive for chest pain and palpitations. Negative for claudication, dyspnea on exertion, leg swelling, near-syncope, orthopnea, paroxysmal nocturnal dyspnea and syncope.  Respiratory:  Positive for shortness of breath. Negative for hemoptysis.   Musculoskeletal:  Negative for muscle cramps and myalgias.  Gastrointestinal:  Negative for abdominal pain, constipation, diarrhea, hematemesis, hematochezia, melena, nausea and vomiting.  Neurological:  Negative for dizziness  and light-headedness.   PHYSICAL EXAM: Vitals with BMI 06/01/2021 05/23/2021 05/23/2021  Height 5' 8.5" - -  Weight 278 lbs - -  BMI [id number] - -  Systolic 0000000 0000000 [id number]  Diastolic 87 86 82  Pulse [id number] 82 88   Orthostatic VS for the past 72 hrs (Last 3 readings):   Orthostatic BP Patient Position BP Location Cuff Size Orthostatic Pulse  06/01/21 0858 136/84 Standing Left Arm Large 114  06/01/21 0857 133/87 Sitting Left Arm Large 104  06/01/21 0856 127/83 Supine Left Arm Large 93   CONSTITUTIONAL: Well-developed and well-nourished. No acute distress.  SKIN: Skin is warm and dry. No rash noted. No cyanosis. No pallor. No jaundice HEAD: Normocephalic and atraumatic.  EYES: No scleral icterus MOUTH/THROAT: Moist oral membranes.  NECK: No JVD present. No thyromegaly noted. No carotid bruits  LYMPHATIC: No visible cervical adenopathy.  CHEST Normal respiratory effort. No intercostal retractions  LUNGS: Clear to auscultation bilaterally.  No stridor. No wheezes. No rales.  CARDIOVASCULAR: Regular rate and rhythm, positive S1-S2, no murmurs rubs or gallops appreciated. ABDOMINAL: Obese, soft, nontender, nondistended, positive bowel sounds all 4 quadrants. No apparent ascites.  EXTREMITIES: No peripheral edema, warm to touch, +2DP pulses HEMATOLOGIC: No significant bruising NEUROLOGIC: Oriented to person, place, and time. Nonfocal. Normal muscle tone.  PSYCHIATRIC: Normal mood and affect. Normal behavior. Cooperative  CARDIAC DATABASE: EKG: 06/01/2021: NSR, 98bpm, normal axis, without underlying injury pattern.   Echocardiogram: No results found for this or any previous visit from the past 1095 days.    Stress Testing: No results found for this or any previous visit from the past 1095 days.   Heart Catheterization: None  LABORATORY DATA: CBC Latest Ref Rng & Units 05/23/2021 05/08/2021 02/22/2012  WBC 4.0 - 10.5 K/uL 7.9 9.7 8.3  Hemoglobin 12.0 - 15.0 g/dL 14.5 13.8 12.8  Hematocrit 36.0 - 46.0 % 45.2 42.1 39.1  Platelets 150 - 400 K/uL 216 247 253    CMP Latest Ref Rng & Units 05/23/2021 05/08/2021  Glucose 70 - 99 mg/dL 87 98  BUN 6 - 20 mg/dL 8 7  Creatinine 0.44 - 1.00 mg/dL 0.85 0.90  Sodium 135 - 145 mmol/L 138 137  Potassium 3.5 -  5.1 mmol/L 4.1 3.7  Chloride 98 - 111 mmol/L 109 104  CO2 22 - 32 mmol/L 21(L) 19(L)  Calcium 8.9 - 10.3 mg/dL 9.1 9.2    Lipid Panel  No results found for: CHOL, TRIG, HDL, CHOLHDL, VLDL, LDLCALC, LDLDIRECT, LABVLDL  No components found for: NTPROBNP No results for input(s): PROBNP in the last 8760 hours. No results for input(s): TSH in the last 8760 hours.  BMP Recent Labs    05/08/21 0927 05/23/21 1457  NA 137 138  K 3.7 4.1  CL 104 109  CO2 19* 21*  GLUCOSE 98 87  BUN 7 8  CREATININE 0.90 0.85  CALCIUM 9.2 9.1  GFRNONAA >60 >60    HEMOGLOBIN A1C No results found for: HGBA1C, MPG  External Labs: Collected: 05/06/2021 Hemoglobin 14.3 g/dL, hematocrit 42.5% Sodium 139, potassium 4.4, chloride 106, bicarb 24, BUN 6, creatinine 0.79 AST 17, ALT 16, alkaline phosphatase 67 TSH 1.48  IMPRESSION:    ICD-10-CM   1. Palpitations  R00.2 EKG 12-Lead    LONG TERM MONITOR (3-14 DAYS)    2. Precordial pain  R07.2 PCV ECHOCARDIOGRAM COMPLETE    PCV CARDIAC STRESS TEST    3. Shortness of breath  R06.02     4. Class 3  severe obesity due to excess calories without serious comorbidity with body mass index (BMI) of 40.0 to 44.9 in adult [facility])  E66.01    Z68.41        RECOMMENDATIONS: Muriel Hannold is a 25 y.o. African-American female whose past medical history and cardiac risk factors include: obesity, dysmenorrhea, family hx of heart diseaes (Dad had MI in is 39s).    Palpitations EKG nonischemic. TSH within normal limits. Hemoglobin within acceptable range. No syncope. Plan echo to evaluate for structural heart disease. 14-day extended Holter monitor to evaluate for dysrhythmias. No identifiable reversible causes.  Precordial pain Predominately noncardiac. Exercise treadmill stress test to evaluate for functional status and exercise-induced reversible ischemia Plan echo as discussed above  Shortness of breath D-dimer is within normal limits. High sensitive  troponins within normal limits. Chest x-ray unremarkable EKG nonischemic Educated on importance of improving her modifiable cardiovascular risk factors. Encouraged to increase physical activity as tolerated with a goal of 30 minutes a day 5 days a week. She is also encouraged to follow-up with PCP for evaluation of noncardiac causes of shortness of breath.  Class 3 severe obesity due to excess calories without serious comorbidity with body mass index (BMI) of 40.0 to 44.9 in adult [facility]) Body mass index is 41.65 kg/m. I reviewed with the patient the importance of diet, regular physical activity/exercise, weight loss.   Patient is educated on increasing physical activity gradually as tolerated.  With the goal of moderate intensity exercise for 30 minutes a day 5 days a week.  FINAL MEDICATION LIST END OF ENCOUNTER: No orders of the defined types were placed in this encounter.   Medications Discontinued During This Encounter  Medication Reason   meloxicam (MOBIC) 15 MG tablet    norgestimate-ethinyl estradiol (SPRINTEC 28) 0.25-35 MG-MCG tablet    norgestimate-ethinyl estradiol (SPRINTEC 28) 0.25-35 MG-MCG tablet      Current Outpatient Medications:    hydrOXYzine (ATARAX/VISTARIL) 25 MG tablet, Take 1 tablet (25 mg total) by mouth every 8 (eight) hours as needed for up to 30 doses for anxiety., Disp: 30 tablet, Rfl: 0   ibuprofen (ADVIL) 600 MG tablet, Take 1,200 mg by mouth 2 (two) times daily as needed., Disp: , Rfl:    norethindrone (AYGESTIN) 5 MG tablet, Take 5 mg by mouth daily., Disp: , Rfl:   Orders Placed This Encounter  Procedures   PCV CARDIAC STRESS TEST   LONG TERM MONITOR (3-14 DAYS)   EKG 12-Lead   PCV ECHOCARDIOGRAM COMPLETE    There are no Patient Instructions on file for this visit.   --Continue cardiac medications as reconciled in final medication list. --Return in about 7 weeks (around 07/20/2021) for Reevaluation of, Chest pain, Palpitations, Review test  results. Or sooner if needed. --Continue follow-up with your primary care physician regarding the management of your other chronic comorbid conditions.  Patient's questions and concerns were addressed to her satisfaction. She voices understanding of the instructions provided during this encounter.   This note was created using a voice recognition software as a result there may be grammatical errors inadvertently enclosed that do not reflect the nature of this encounter. Every attempt is made to correct such errors.  Tessa Lerner, [place], [facility]  Pager: [phone] Office: [phone]

## 2021-06-03 ENCOUNTER — Other Ambulatory Visit: Payer: Self-pay

## 2021-06-03 DIAGNOSIS — R072 Precordial pain: Secondary | ICD-10-CM

## 2021-06-11 ENCOUNTER — Inpatient Hospital Stay: Payer: Medicaid Other

## 2021-06-11 ENCOUNTER — Ambulatory Visit: Payer: Medicaid Other

## 2021-06-11 ENCOUNTER — Other Ambulatory Visit: Payer: Self-pay

## 2021-06-11 DIAGNOSIS — R002 Palpitations: Secondary | ICD-10-CM

## 2021-06-12 ENCOUNTER — Other Ambulatory Visit: Payer: Self-pay

## 2021-06-12 ENCOUNTER — Ambulatory Visit: Payer: Medicaid Other | Attending: Podiatry | Admitting: Physical Therapy

## 2021-06-12 ENCOUNTER — Encounter: Payer: Self-pay | Admitting: Physical Therapy

## 2021-06-12 DIAGNOSIS — M6281 Muscle weakness (generalized): Secondary | ICD-10-CM | POA: Insufficient documentation

## 2021-06-12 DIAGNOSIS — R293 Abnormal posture: Secondary | ICD-10-CM | POA: Insufficient documentation

## 2021-06-12 DIAGNOSIS — M25672 Stiffness of left ankle, not elsewhere classified: Secondary | ICD-10-CM | POA: Diagnosis present

## 2021-06-12 DIAGNOSIS — M25671 Stiffness of right ankle, not elsewhere classified: Secondary | ICD-10-CM | POA: Diagnosis present

## 2021-06-12 NOTE — Patient Instructions (Signed)
Access Code: [email or web address] URL: [email or web address] Date: 06/12/2021 Prepared by: Karie Mainland  Exercises Seated Ankle Eversion with Resistance - 2 x daily - 7 x weekly - 2-3 sets - 10 reps - 5 hold Seated Figure 4 Ankle Inversion with Resistance - 2 x daily - 7 x weekly - 2-3 sets - 10 reps - 5 hold Standing Gastroc Stretch - 2 x daily - 7 x weekly - 1-2 sets - 10 reps - 30 hold Standing Heel Raises - 2 x daily - 7 x weekly - 2-3 sets - 10 reps - 5 hold Seated Toe Towel Scrunches - 2 x daily - 7 x weekly - 2-3 sets - 10 reps - 5 hold

## 2021-06-12 NOTE — Therapy (Signed)
[facility] [address] [place], [place], [address] Phone: [phone]   Fax:  [phone]  Physical Therapy Evaluation  Patient Details  Name: Carol Schwartz MRN: [id number] Date of Birth: 03-10-1996 Referring Provider (PT): Dr. Sharl Ma   Encounter Date: 06/12/2021   PT End of Session - 06/12/21 1128     Visit Number 1    Number of Visits 4    Date for PT Re-Evaluation 07/24/21    Authorization Type [place] [employer]    PT Start Time 1045    PT Stop Time 1127    PT Time Calculation (min) 42 min    Activity Tolerance Patient tolerated treatment well    Behavior During Therapy [facility] for tasks assessed/performed             Past Medical History:  Diagnosis Date   Anxiety     Past Surgical History:  Procedure Laterality Date   LAPAROSCOPY  02/24/2012   Procedure: LAPAROSCOPY OPERATIVE;  Surgeon: Geryl Rankins, MD;  Location: [facility];  Service: Gynecology;  Laterality: N/A;  Drainage of Left Ovarian Cyst   OVARIAN CYST REMOVAL  02/24/2012   Procedure: OVARIAN CYSTECTOMY;  Surgeon: Geryl Rankins, MD;  Location: [facility];  Service: Gynecology;  Laterality: Right;    There were no vitals filed for this visit.    Subjective Assessment - 06/12/21 1046     Subjective Pt reports onset on foot pain L wore boot for 6 weeks and then weaned out.  She also noticed Rt foot/ankle began to hurt.  She had bilateral tingling pain in the L foot at that time (early 2022).  The Rt one felt different  (achy). She has not had orthotics at this point.  MD rec she take time off from work and she has not returned.  Standing all day ([employer]) and pain became severe, was unable to walk at times.  She is also having some heart palpitations. She has not had as much pain  as she did when it first started.  She can now walk a bit better than before but it still happens. If pain is present she has diff walking but does not necessrily bring it on .     Pertinent History recent palpitations    Limitations Standing;Walking    How long can you walk comfortably? 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     Complete ATFL tear appears chronic.     Very mild Achilles tendinosis without tear.    Patient Stated Goals Patient would like to make it better    Currently in Pain? Yes    Pain Score 2     Pain Location Foot    Pain Orientation Right;Left   dorsum   Pain Descriptors / Indicators Sore;Tingling;Other (Comment)   pinching   Pain Type Chronic pain    Pain Onset More than a month ago    Pain Frequency Intermittent    Aggravating Factors  walking, standing    Pain Relieving Factors rest, sitting or lying down has not tried ice or heat. sometimes supportive shoes    Effect of Pain on Daily Activities unable to stand, be active    Multiple Pain Sites No                [facility] PT Assessment - 06/12/21 0001       Assessment   Medical Diagnosis bilateral extensor tendinitis , acquired equinus deformity  Referring Provider (PT) Dr. Sharl Ma    Onset Date/Surgical Date --   > 9 mos   Prior Therapy No referral 02/2021      Precautions   Precautions None      Restrictions   Weight Bearing Restrictions No      Balance Screen   Has the patient fallen in the past 6 months No      Home Environment   Living Environment Private residence    Living Arrangements Parent    Type of Home Apartment    Home Layout One level      Prior Function   Level of Independence Independent    Vocation Unemployed    Leisure cooking, wants to be able to workout, walking      Cognition   Overall Cognitive Status Within Functional Limits for tasks assessed      Observation/Other Assessments   Focus on Therapeutic Outcomes (FOTO)  NT MCD      Observation/Other Assessments-Edema    Edema --   none     Sensation   Light Touch Appears Intact      Coordination   Gross Motor Movements are Fluid and  Coordinated Not tested      Functional Tests   Functional tests Squat;Single leg stance      Squat   Comments poor form, malalignment      Single Leg Stance   Comments < 10 sec each LE      Posture/Postural Control   Posture/Postural Control Postural limitations    Postural Limitations Rounded Shoulders;Forward head    Posture Comments genu valgus, recurvatum , pes planus      AROM   Right Ankle Dorsiflexion 0    Right Ankle Plantar Flexion 45    Right Ankle Inversion 20    Right Ankle Eversion 5    Left Ankle Dorsiflexion -3    Left Ankle Plantar Flexion 53    Left Ankle Inversion 18    Left Ankle Eversion 10      Strength   Right Hip Flexion 4-/5    Right Hip ABduction 3+/5    Left Hip Flexion 4+/5    Left Hip ABduction 3+/5    Right Knee Flexion 4+/5    Right Knee Extension 4+/5    Left Knee Flexion 4+/5    Left Knee Extension 4+/5    Right Ankle Dorsiflexion 4/5    Right Ankle Plantar Flexion 3+/5    Right Ankle Inversion 3+/5    Right Ankle Eversion 3+/5    Left Ankle Dorsiflexion 4/5    Left Ankle Plantar Flexion 3+/5    Left Ankle Inversion 3+/5    Left Ankle Eversion 3+/5      Palpation   Palpation comment no pain throughout foot, Achilles, calf      Transfers   Five time sit to stand comments  17 sec                        Objective measurements completed on examination: See above findings.                PT Education - 06/12/21 1136     Education Details PT, HEP, alignment, POC    Person(s) Educated Patient    Methods Explanation    Comprehension Verbalized understanding                 PT Long Term Goals - 06/12/21 1136  PT LONG TERM GOAL #1   Title Pt will I with HEP for ankle and foot    Baseline unknown, given on eval    Time 4    Period Weeks    Status New    Target Date 07/24/21      PT LONG TERM GOAL #2   Title Pt will be able to walk 30 min without increased foot pain in the community     Baseline can do this with moderate pain at times , minimal activity    Time 6    Period Weeks    Status New    Target Date 07/24/21      PT LONG TERM GOAL #3   Title Pt will be able to wear orthotics and proper footwear for optimal foot support daily.    Baseline does not have orthotics yet , poor foot support today    Time 6    Period Weeks    Status New    Target Date 07/24/21                    Plan - 06/12/21 1138     Clinical Impression Statement Patient presents for low complexity eval of bilateral ankle/foot pain due to chronic tear, malalignment.  She has weakness in her ankle, calf, foot intrinsics and hips. She lacks AROM in ankle DF and eversion. She is currently being worked up for cardiac issues, palpitations and would like to try HEP at home as well as get orthotics.  She will reach out to PT if these issues are resolved and she can focus on her ankle/foot at that time.    Personal Factors and Comorbidities Time since onset of injury/illness/exacerbation;Comorbidity 1    Comorbidities obesity with poor LE alignment    Examination-Activity Limitations Squat;Locomotion Level;Stand    Stability/Clinical Decision Making Stable/Uncomplicated    Clinical Decision Making Low    Rehab Potential Good    PT Frequency 1x / week    PT Duration 4 weeks   did not schedule   PT Treatment/Interventions ADLs/Self Care Home Management;Cryotherapy;Manual techniques;Dry needling;Moist Heat;Therapeutic exercise;Patient/family education;Functional mobility training;Passive range of motion    PT Next Visit Plan check HEP, ankle stability, foot intrinsics    PT Home Exercise Plan Access Code [email or web address]    Consulted and Agree with Plan of Care Patient             Patient will benefit from skilled therapeutic intervention in order to improve the following deficits and impairments:  Abnormal gait, Decreased range of motion, Decreased mobility, Decreased balance, Decreased  endurance, Decreased strength, Increased fascial restricitons, Impaired flexibility, Obesity, Postural dysfunction, Pain  Visit Diagnosis: Abnormal posture  Stiffness of left ankle, not elsewhere classified  Stiffness of right ankle, not elsewhere classified  Muscle weakness (generalized)     Problem List Patient Active Problem List   Diagnosis Date Noted   Ovarian cyst 11/13/2020   Dysmenorrhea 11/13/2020    Madelene Kaatz, PT 06/12/2021, 12:55 PM  [facility] [address] [place], [place], [address] Phone: [phone]   Fax:  [phone]  Name: Carol Schwartz MRN: [id number] Date of Birth: 1995/12/07  Karie Mainland, PT 06/12/21 12:55 PM Phone: [phone] Fax: [phone]

## 2021-06-13 ENCOUNTER — Other Ambulatory Visit: Payer: Self-pay

## 2021-06-13 ENCOUNTER — Encounter (HOSPITAL_COMMUNITY): Payer: Self-pay | Admitting: Emergency Medicine

## 2021-06-13 ENCOUNTER — Emergency Department (HOSPITAL_BASED_OUTPATIENT_CLINIC_OR_DEPARTMENT_OTHER)
Admission: EM | Admit: 2021-06-13 | Discharge: 2021-06-13 | Disposition: A | Discharge status: Home / Self Care | Payer: Medicaid Other | Source: Home / Self Care | Attending: Emergency Medicine | Admitting: Emergency Medicine

## 2021-06-13 ENCOUNTER — Encounter (HOSPITAL_BASED_OUTPATIENT_CLINIC_OR_DEPARTMENT_OTHER): Payer: Self-pay

## 2021-06-13 ENCOUNTER — Emergency Department (HOSPITAL_COMMUNITY)
Admission: EM | Admit: 2021-06-13 | Discharge: 2021-06-13 | Disposition: A | Discharge status: Home / Self Care | Payer: Medicaid Other | Attending: Student | Admitting: Student

## 2021-06-13 ENCOUNTER — Emergency Department (HOSPITAL_COMMUNITY): Payer: Medicaid Other

## 2021-06-13 DIAGNOSIS — R002 Palpitations: Secondary | ICD-10-CM | POA: Insufficient documentation

## 2021-06-13 DIAGNOSIS — R0602 Shortness of breath: Secondary | ICD-10-CM | POA: Diagnosis present

## 2021-06-13 LAB — COMPREHENSIVE METABOLIC PANEL
ALT: 26 U/L (ref 0–44)
AST: 25 U/L (ref 15–41)
Albumin: 3.4 g/dL — ABNORMAL LOW (ref 3.5–5.0)
Alkaline Phosphatase: 67 U/L (ref 38–126)
Anion gap: 8 (ref 5–15)
BUN: 7 mg/dL (ref 6–20)
CO2: 25 mmol/L (ref 22–32)
Calcium: 9 mg/dL (ref 8.9–10.3)
Chloride: 105 mmol/L (ref 98–111)
Creatinine, Ser: 0.92 mg/dL (ref 0.44–1.00)
GFR, Estimated: >60 mL/min (ref >60)
Glucose, Bld: 88 mg/dL (ref 70–99)
Potassium: 4 mmol/L (ref 3.5–5.1)
Sodium: 138 mmol/L (ref 135–145)
Total Bilirubin: 0.7 mg/dL (ref 0.3–1.2)
Total Protein: 7.1 g/dL (ref 6.5–8.1)

## 2021-06-13 LAB — CBC WITH DIFFERENTIAL/PLATELET
Abs Immature Granulocytes: 0.04 10*3/uL (ref 0.00–0.07)
Basophils Absolute: 0 10*3/uL (ref 0.0–0.1)
Basophils Relative: 0 %
Eosinophils Absolute: 0 10*3/uL (ref 0.0–0.5)
Eosinophils Relative: 0 %
HCT: 45.7 % (ref 36.0–46.0)
Hemoglobin: 14.7 g/dL (ref 12.0–15.0)
Immature Granulocytes: 1 %
Lymphocytes Relative: 16 %
Lymphs Abs: 1.4 10*3/uL (ref 0.7–4.0)
MCH: 30.6 pg (ref 26.0–34.0)
MCHC: 32.2 g/dL (ref 30.0–36.0)
MCV: 95.2 fL (ref 80.0–100.0)
Monocytes Absolute: 0.5 10*3/uL (ref 0.1–1.0)
Monocytes Relative: 5 %
Neutro Abs: 6.8 10*3/uL (ref 1.7–7.7)
Neutrophils Relative %: 78 %
Platelets: 231 10*3/uL (ref 150–400)
RBC: 4.8 MIL/uL (ref 3.87–5.11)
RDW: 13 % (ref 11.5–15.5)
WBC: 8.8 10*3/uL (ref 4.0–10.5)
nRBC: 0 % (ref 0.0–0.2)

## 2021-06-13 LAB — I-STAT BETA HCG BLOOD, ED (MC, WL, AP ONLY): I-stat hCG, quantitative: <5 m[IU]/mL (ref <5)

## 2021-06-13 LAB — LIPASE, BLOOD: Lipase: 25 U/L (ref 11–51)

## 2021-06-13 LAB — TROPONIN I (HIGH SENSITIVITY)
Troponin I (High Sensitivity): <2 ng/L (ref <18)
Troponin I (High Sensitivity): <2 ng/L (ref <18)

## 2021-06-13 NOTE — ED Triage Notes (Signed)
Patient arrived with EMS from home reports intermittent palpitations with mild SOB this evening , no chest pain , denies emesis or diaphoresis .

## 2021-06-13 NOTE — ED Provider Notes (Signed)
[facility] EMERGENCY DEPARTMENT Provider Note   CSN: [id number] Arrival date & time: 06/13/21  0137     History Chief Complaint  Patient presents with   Palpitations/SOB     Carol Schwartz is a 25 y.o. female.  25 year old female presents with concern for palpitations which have been intermittent for the past month or so.  Patient has been seen in the emergency room as well as followed up with PCP and cardiology.  Patient recently completed ambulatory monitoring but does not have her results for this.  Patient feels like her symptoms were improving after starting a vegan diet however notices certain foods seem to trigger her palpitations.  Patient ate coconut cake and popcorn yesterday morning which started her most recent episode of palpitations.  Patient states that despite flossing her teeth, she feels like there is still popcorn stuck in her throat and chest.  Denies any associated hives, vomiting or shortness of breath.      Past Medical History:  Diagnosis Date   Anxiety     Patient Active Problem List   Diagnosis Date Noted   Ovarian cyst 11/13/2020   Dysmenorrhea 11/13/2020    Past Surgical History:  Procedure Laterality Date   LAPAROSCOPY  02/24/2012   Procedure: LAPAROSCOPY OPERATIVE;  Surgeon: Geryl Rankins, MD;  Location: [facility];  Service: Gynecology;  Laterality: N/A;  Drainage of Left Ovarian Cyst   OVARIAN CYST REMOVAL  02/24/2012   Procedure: OVARIAN CYSTECTOMY;  Surgeon: Geryl Rankins, MD;  Location: [facility];  Service: Gynecology;  Laterality: Right;     OB History   No obstetric history on file.     Family History  Problem Relation Age of Onset   Diabetes Father    Heart attack Father 91   Hypertension Other     Social History   Tobacco Use   Smoking status: Never   Smokeless tobacco: Never  Vaping Use   Vaping Use: Never used  Substance Use Topics   Alcohol use: No   Drug use: No    Home Medications Prior to  Admission medications   Medication Sig Start Date End Date Taking? Authorizing Provider  hydrOXYzine (ATARAX/VISTARIL) 25 MG tablet Take 1 tablet (25 mg total) by mouth every 8 (eight) hours as needed for up to 30 doses for anxiety. 05/23/21   Terald Sleeper, MD  ibuprofen (ADVIL) 600 MG tablet Take 1,200 mg by mouth 2 (two) times daily as needed. 09/18/20   [provider]  norethindrone (AYGESTIN) 5 MG tablet Take 5 mg by mouth daily. 10/29/20   [provider]    Allergies    Patient has no known allergies.  Review of Systems   Review of Systems  Constitutional:  Negative for chills and fever.  Respiratory:  Negative for chest tightness and shortness of breath.   Cardiovascular:  Positive for palpitations. Negative for chest pain.  Gastrointestinal:  Negative for nausea and vomiting.  Musculoskeletal:  Negative for arthralgias and myalgias.  Skin:  Negative for rash and wound.  Allergic/Immunologic: Negative for immunocompromised state.  Neurological:  Negative for weakness.  Psychiatric/Behavioral:  Negative for confusion.   All other systems reviewed and are negative.  Physical Exam Updated Vital Signs BP (!) 98/55   Pulse 95   Temp 98.1 F (36.7 C) (Oral)   Resp 20   Ht 5\' 8"  (1.727 m)   Wt (!) 140 kg   SpO2 100%   BMI 46.93 kg/m   Physical Exam  Vitals and nursing note reviewed.  Constitutional:      General: She is not in acute distress.    Appearance: She is well-developed. She is obese. She is not diaphoretic.  HENT:     Head: Normocephalic and atraumatic.     Mouth/Throat:     Mouth: Mucous membranes are moist.     Pharynx: No oropharyngeal exudate or posterior oropharyngeal erythema.  Eyes:     Conjunctiva/sclera: Conjunctivae normal.  Cardiovascular:     Rate and Rhythm: Normal rate and regular rhythm.     Pulses: Normal pulses.     Heart sounds: Normal heart sounds.  Pulmonary:     Effort: Pulmonary effort is normal.     Breath  sounds: Normal breath sounds.  Abdominal:     Palpations: Abdomen is soft.     Tenderness: There is no abdominal tenderness.  Musculoskeletal:     Cervical back: Neck supple.     Right lower leg: No edema.     Left lower leg: No edema.  Lymphadenopathy:     Cervical: No cervical adenopathy.  Skin:    General: Skin is warm and dry.     Findings: No erythema or rash.  Neurological:     Mental Status: She is alert and oriented to person, place, and time.  Psychiatric:        Behavior: Behavior normal.    ED Results / Procedures / Treatments   Labs (all labs ordered are listed, but only abnormal results are displayed) Labs Reviewed  COMPREHENSIVE METABOLIC PANEL - Abnormal; Notable for the following components:      Result Value   Albumin 3.4 (*)    All other components within normal limits  LIPASE, BLOOD  CBC WITH DIFFERENTIAL/PLATELET  I-STAT BETA HCG BLOOD, ED ([facility], [facility], [facility] ONLY)  TROPONIN I (HIGH SENSITIVITY)  TROPONIN I (HIGH SENSITIVITY)    EKG None  Radiology DG Chest 2 View  Result Date: 06/13/2021 CLINICAL DATA:  Palpitations EXAM: CHEST - 2 VIEW COMPARISON:  05/23/2021 FINDINGS: Lungs are clear.  No pleural effusion or pneumothorax. The heart is normal in size. Holter monitor overlying left hemithorax. Visualized osseous structures are within normal limits. IMPRESSION: Normal chest radiographs. Electronically Signed   By: Charline Bills M.D.   On: 06/13/2021 02:41    Procedures Procedures   Medications Ordered in ED Medications - No data to display  ED Course  I have reviewed the triage vital signs and the nursing notes.  Pertinent labs & imaging results that were available during my care of the patient were reviewed by me and considered in my medical decision making (see chart for details).  Clinical Course as of 06/13/21 1309  Sat Jun 13, 2021  8059 25 year old female with complaint of palpitations which been ongoing for a month or so and seen by PCP,  ER and cardiology in the past presents with another episode after eating coconut cake and popcorn yesterday patient feels like maybe she still has some popcorn stuck in her throat somewhere that is continuing to exacerbate her symptoms.  Patient is currently asymptomatic.  She is well-appearing on exam, vitals monitored throughout extensive ER stay, she did have 1 pulse recording of 113 otherwise heart rates have been in the 90s.  O2 sats 99 to 100%.  No complaints of shortness of breath. Patient is pending results from her Holter monitoring and follow-up with cardiology regarding those.  She feels there are specific foods that trigger her symptoms.  Encourage patient to continue log of those foods that cause her symptoms.  Encouraged DASH diet and limiting processed foods.  Return to the ED for worsening or concerning symptoms otherwise follow-up with PCP as encouraged by cardiology. [LM]    Clinical Course User Index [LM] Alden Hipp   MDM Rules/Calculators/A&P                           Final Clinical Impression(s) / ED Diagnoses Final diagnoses:  Palpitations    Rx / DC Orders ED Discharge Orders     None        Alden Hipp 06/13/21 1310    Kom[place], [place], MD 06/13/21 [phone]

## 2021-06-13 NOTE — ED Provider Notes (Signed)
Emergency Medicine Provider Triage Evaluation Note  Carol Schwartz , a 25 y.o. female  was evaluated in triage.  Pt complains of palpitations.  She states that she has had intermittent palpitations, she was recently seen by cardiology and placed on a 2-week Holter monitor per her report. She states this was placed on Friday. She denies any fevers.  She notes that her palpitations feel like her heart is intermittently missing beats.  She has had them much more frequently recently and today had shortness of breath also. She notes that she has had diet changes, has recently been starting a vegan diet, notes that when she eats something that is higher in fatty content or oils that she has an increase in her pain.  She denies nausea or vomiting.  No diaphoresis.  Review of Systems  Positive: Palpations, shortness of breath Negative: Syncope, fevers  Physical Exam  BP (!) 116/95 (BP Location: Right Arm)   Pulse 92   Temp 97.8 F (36.6 C) (Oral)   Resp (!) 24   Ht 5\' 8"  (1.727 m)   Wt (!) 140 kg   SpO2 99%   BMI 46.93 kg/m  Gen:   Awake, no distress   Resp:  Normal effort  MSK:   Moves extremities without difficulty  Other:  RRR, lungs CTAB.  Abdomen is slightly tender LUQ.    Medical Decision Making  Medically screening exam initiated at 2:25 AM.  Appropriate orders placed.  Carol Schwartz was informed that the remainder of the evaluation will be completed by another provider, this initial triage assessment does not replace that evaluation, and the importance of remaining in the ED until their evaluation is complete.  Note: Portions of this report may have been transcribed using voice recognition software. Every effort was made to ensure accuracy; however, inadvertent computerized transcription errors may be present    Carol Schwartz 06/13/21 0228    Carol Schwartz, 14/03/22, MD 06/13/21 [phone]

## 2021-06-13 NOTE — Discharge Instructions (Addendum)
Continue with food log.  Recommend trying whole food diet and avoiding processed foods.  Follow-up with your primary care provider.

## 2021-06-13 NOTE — ED Triage Notes (Signed)
Patient BIB [facility] from Home for ABD Pain.  Patient has been seen a few times for Palpitations with findings being unremarkable. Patient has however noticed when she eats she begins to have these Heart Palpitations.   NAD Noted during Triage. A&Ox4. GCS 15. Ambulatory.

## 2021-06-13 NOTE — Discharge Instructions (Signed)
Your heart rate is normal right now.  Please follow-up with your cardiologist.   Please keep the Holter monitor on and send it in and your cardiologist will follow up your results  Return to ER if you have worse palpitations, chest pain, trouble breathing

## 2021-06-13 NOTE — ED Provider Notes (Signed)
[facility] EMERGENCY DEPT Provider Note   CSN: [id number] Arrival date & time: 06/13/21  2109     History Chief Complaint  Patient presents with   Palpitations    Carol Schwartz is a 25 y.o. female history of anxiety here presenting with palpitations.  Patient has been having intermittent palpitations going on for the last several weeks.  Patient was seen in the ED and of October as well as middle of November.  Patient then followed up with cardiology and had unremarkable echo and is currently on a Holter monitor.  Patient was actually seen this morning at she had unremarkable labs at that time.  Patient also had a negative D-dimer recently.  Patient told the provider that her palpitations seem to come on with certain food.  She did eat some dinner today and had some subjective palpitations.  Denies any chest pain or shortness of breath   The history is provided by the patient.      Past Medical History:  Diagnosis Date   Anxiety     Patient Active Problem List   Diagnosis Date Noted   Ovarian cyst 11/13/2020   Dysmenorrhea 11/13/2020    Past Surgical History:  Procedure Laterality Date   LAPAROSCOPY  02/24/2012   Procedure: LAPAROSCOPY OPERATIVE;  Surgeon: Geryl Rankins, MD;  Location: [facility];  Service: Gynecology;  Laterality: N/A;  Drainage of Left Ovarian Cyst   OVARIAN CYST REMOVAL  02/24/2012   Procedure: OVARIAN CYSTECTOMY;  Surgeon: Geryl Rankins, MD;  Location: [facility];  Service: Gynecology;  Laterality: Right;     OB History   No obstetric history on file.     Family History  Problem Relation Age of Onset   Diabetes Father    Heart attack Father 18   Hypertension Other     Social History   Tobacco Use   Smoking status: Never   Smokeless tobacco: Never  Vaping Use   Vaping Use: Never used  Substance Use Topics   Alcohol use: No   Drug use: No    Home Medications Prior to Admission medications   Medication Sig Start Date End Date  Taking? Authorizing Provider  hydrOXYzine (ATARAX/VISTARIL) 25 MG tablet Take 1 tablet (25 mg total) by mouth every 8 (eight) hours as needed for up to 30 doses for anxiety. 05/23/21   Terald Sleeper, MD  ibuprofen (ADVIL) 600 MG tablet Take 1,200 mg by mouth 2 (two) times daily as needed. 09/18/20   [provider]  norethindrone (AYGESTIN) 5 MG tablet Take 5 mg by mouth daily. 10/29/20   [provider]    Allergies    Patient has no known allergies.  Review of Systems   Review of Systems  Cardiovascular:  Positive for palpitations.  All other systems reviewed and are negative.  Physical Exam Updated Vital Signs BP 126/83   Pulse 91   Temp 98.4 F (36.9 C)   Resp 16   Ht 5\' 8"  (1.727 m)   Wt (!) 140 kg   SpO2 98%   BMI 46.93 kg/m   Physical Exam Vitals and nursing note reviewed.  Constitutional:      Appearance: Normal appearance.     Comments: Slightly anxious   HENT:     Head: Normocephalic.     Nose: Nose normal.     Mouth/Throat:     Mouth: Mucous membranes are moist.  Eyes:     Extraocular Movements: Extraocular movements intact.     Pupils: Pupils  are equal, round, and reactive to light.  Cardiovascular:     Rate and Rhythm: Normal rate and regular rhythm.     Pulses: Normal pulses.     Heart sounds: Normal heart sounds.  Pulmonary:     Effort: Pulmonary effort is normal.     Breath sounds: Normal breath sounds.  Abdominal:     General: Abdomen is flat.     Palpations: Abdomen is soft.  Musculoskeletal:        General: Normal range of motion.     Cervical back: Normal range of motion and neck supple.  Skin:    General: Skin is warm.     Capillary Refill: Capillary refill takes less than 2 seconds.  Neurological:     General: No focal deficit present.     Mental Status: She is alert and oriented to person, place, and time.  Psychiatric:        Mood and Affect: Mood normal.        Behavior: Behavior normal.    ED Results /  Procedures / Treatments   Labs (all labs ordered are listed, but only abnormal results are displayed) Labs Reviewed - No data to display  EKG EKG Interpretation  Date/Time:  Saturday June 13 2021 21:32:06 EST Ventricular Rate:  94 PR Interval:  153 QRS Duration: 84 QT Interval:  343 QTC Calculation: 429 R Axis:   51 Text Interpretation: Sinus rhythm Low voltage, precordial leads Borderline T abnormalities, anterior leads No significant change since last tracing Confirmed by Richardean Canal [phone]) on 06/13/2021 9:36:50 PM  Radiology DG Chest 2 View  Result Date: 06/13/2021 CLINICAL DATA:  Palpitations EXAM: CHEST - 2 VIEW COMPARISON:  05/23/2021 FINDINGS: Lungs are clear.  No pleural effusion or pneumothorax. The heart is normal in size. Holter monitor overlying left hemithorax. Visualized osseous structures are within normal limits. IMPRESSION: Normal chest radiographs. Electronically Signed   By: Charline Bills M.D.   On: 06/13/2021 02:41    Procedures Procedures   Medications Ordered in ED Medications - No data to display  ED Course  I have reviewed the triage vital signs and the nursing notes.  Pertinent labs & imaging results that were available during my care of the patient were reviewed by me and considered in my medical decision making (see chart for details).    MDM Rules/Calculators/A&P                           [name] is a 25 y.o. female here presenting with palpitations.  This been going on for the last several weeks. Patient actually had a Holter monitor on.  Patient has seen cardiology already.  Patient actually had labs this morning that were unremarkable.  Her heart rate here is 91.  I reassured patient.  I told her that she can follow-up with the Holter results.  If her heart rate is persistently elevated then she can return to the ED for evaluation   Final Clinical Impression(s) / ED Diagnoses Final diagnoses:  None    Rx / DC Orders ED  Discharge Orders     None        [name], MD 06/13/21 2154

## 2021-06-13 NOTE — ED Notes (Signed)
Patient discharge instructions reviewed with the patient. The patient verbalized understanding of instructions. Patient discharged. 

## 2021-06-14 ENCOUNTER — Emergency Department (HOSPITAL_COMMUNITY)
Admission: EM | Admit: 2021-06-14 | Discharge: 2021-06-15 | Disposition: A | Discharge status: Home / Self Care | Payer: Medicaid Other | Attending: Emergency Medicine | Admitting: Emergency Medicine

## 2021-06-14 ENCOUNTER — Emergency Department (HOSPITAL_COMMUNITY): Payer: Medicaid Other

## 2021-06-14 ENCOUNTER — Encounter (HOSPITAL_COMMUNITY): Payer: Self-pay | Admitting: Emergency Medicine

## 2021-06-14 DIAGNOSIS — K9049 Malabsorption due to intolerance, not elsewhere classified: Secondary | ICD-10-CM | POA: Insufficient documentation

## 2021-06-14 DIAGNOSIS — R002 Palpitations: Secondary | ICD-10-CM | POA: Diagnosis present

## 2021-06-14 DIAGNOSIS — F419 Anxiety disorder, unspecified: Secondary | ICD-10-CM | POA: Insufficient documentation

## 2021-06-14 DIAGNOSIS — R5383 Other fatigue: Secondary | ICD-10-CM | POA: Diagnosis not present

## 2021-06-14 LAB — CBC
HCT: 43.4 % (ref 36.0–46.0)
Hemoglobin: 14.1 g/dL (ref 12.0–15.0)
MCH: 31 pg (ref 26.0–34.0)
MCHC: 32.5 g/dL (ref 30.0–36.0)
MCV: 95.4 fL (ref 80.0–100.0)
Platelets: 218 10*3/uL (ref 150–400)
RBC: 4.55 MIL/uL (ref 3.87–5.11)
RDW: 13.1 % (ref 11.5–15.5)
WBC: 8.1 10*3/uL (ref 4.0–10.5)
nRBC: 0 % (ref 0.0–0.2)

## 2021-06-14 LAB — BASIC METABOLIC PANEL
Anion gap: 14 (ref 5–15)
BUN: 6 mg/dL (ref 6–20)
CO2: 20 mmol/L — ABNORMAL LOW (ref 22–32)
Calcium: 9 mg/dL (ref 8.9–10.3)
Chloride: 104 mmol/L (ref 98–111)
Creatinine, Ser: 0.88 mg/dL (ref 0.44–1.00)
GFR, Estimated: >60 mL/min (ref >60)
Glucose, Bld: 77 mg/dL (ref 70–99)
Potassium: 3.7 mmol/L (ref 3.5–5.1)
Sodium: 138 mmol/L (ref 135–145)

## 2021-06-14 LAB — TSH: TSH: 1.075 u[IU]/mL (ref 0.350–4.500)

## 2021-06-14 LAB — HEPATIC FUNCTION PANEL
ALT: 28 U/L (ref 0–44)
AST: 31 U/L (ref 15–41)
Albumin: 3.4 g/dL — ABNORMAL LOW (ref 3.5–5.0)
Alkaline Phosphatase: 58 U/L (ref 38–126)
Bilirubin, Direct: 0.3 mg/dL — ABNORMAL HIGH (ref 0.0–0.2)
Indirect Bilirubin: 1 mg/dL — ABNORMAL HIGH (ref 0.3–0.9)
Total Bilirubin: 1.3 mg/dL — ABNORMAL HIGH (ref 0.3–1.2)
Total Protein: 6.9 g/dL (ref 6.5–8.1)

## 2021-06-14 LAB — PHOSPHORUS: Phosphorus: 2.6 mg/dL (ref 2.5–4.6)

## 2021-06-14 LAB — TROPONIN I (HIGH SENSITIVITY): Troponin I (High Sensitivity): <2 ng/L (ref <18)

## 2021-06-14 LAB — LIPASE, BLOOD: Lipase: 24 U/L (ref 11–51)

## 2021-06-14 LAB — MAGNESIUM: Magnesium: 1.7 mg/dL (ref 1.7–2.4)

## 2021-06-14 NOTE — ED Triage Notes (Addendum)
Pt to triage via [facility] from home.  Seen at [facility] and [facility] yesterday.  States when she eats she has palpitations.  Denies pain but states she is having a "weird feeling in her chest and LUQ".

## 2021-06-14 NOTE — ED Provider Notes (Signed)
Emergency Medicine Provider Triage Evaluation Note  Carol Schwartz , a 25 y.o. female  was evaluated in triage.  Pt complains of palpitations that she has been having for couple weeks now.  She states that her symptoms are intermittent.  Usually present after she eats. She will get epigastric and LUQ pains and other strange sensations.  She has had some intermittent chest pains.   Review of Systems  Positive: See above Negative:   Physical Exam  There were no vitals taken for this visit. Gen:   Awake, no distress   Resp:  Normal effort  MSK:   Moves extremities without difficulty  Other:    Medical Decision Making  Medically screening exam initiated at 6:21 PM.  Appropriate orders placed.  [name] was informed that the remainder of the evaluation will be completed by another provider, this initial triage assessment does not replace that evaluation, and the importance of remaining in the ED until their evaluation is complete.     Carol Schwartz 06/14/21 1823    Gerhard Munch, MD 06/14/21 2029

## 2021-06-15 ENCOUNTER — Emergency Department (HOSPITAL_BASED_OUTPATIENT_CLINIC_OR_DEPARTMENT_OTHER)
Admission: EM | Admit: 2021-06-15 | Discharge: 2021-06-15 | Disposition: A | Discharge status: Home / Self Care | Payer: Medicaid Other | Source: Home / Self Care | Attending: Emergency Medicine | Admitting: Emergency Medicine

## 2021-06-15 ENCOUNTER — Other Ambulatory Visit: Payer: Self-pay

## 2021-06-15 ENCOUNTER — Encounter (HOSPITAL_BASED_OUTPATIENT_CLINIC_OR_DEPARTMENT_OTHER): Payer: Self-pay

## 2021-06-15 DIAGNOSIS — F419 Anxiety disorder, unspecified: Secondary | ICD-10-CM

## 2021-06-15 DIAGNOSIS — R5383 Other fatigue: Secondary | ICD-10-CM | POA: Insufficient documentation

## 2021-06-15 LAB — COMPREHENSIVE METABOLIC PANEL
ALT: 25 U/L (ref 0–44)
AST: 22 U/L (ref 15–41)
Albumin: 4.3 g/dL (ref 3.5–5.0)
Alkaline Phosphatase: 53 U/L (ref 38–126)
Anion gap: 14 (ref 5–15)
BUN: 8 mg/dL (ref 6–20)
CO2: 21 mmol/L — ABNORMAL LOW (ref 22–32)
Calcium: 9.8 mg/dL (ref 8.9–10.3)
Chloride: 104 mmol/L (ref 98–111)
Creatinine, Ser: 0.78 mg/dL (ref 0.44–1.00)
GFR, Estimated: >60 mL/min (ref >60)
Glucose, Bld: 81 mg/dL (ref 70–99)
Potassium: 3.7 mmol/L (ref 3.5–5.1)
Sodium: 139 mmol/L (ref 135–145)
Total Bilirubin: 0.8 mg/dL (ref 0.3–1.2)
Total Protein: 7.7 g/dL (ref 6.5–8.1)

## 2021-06-15 LAB — CBC
HCT: 42 % (ref 36.0–46.0)
Hemoglobin: 13.7 g/dL (ref 12.0–15.0)
MCH: 30.2 pg (ref 26.0–34.0)
MCHC: 32.6 g/dL (ref 30.0–36.0)
MCV: 92.5 fL (ref 80.0–100.0)
Platelets: 215 10*3/uL (ref 150–400)
RBC: 4.54 MIL/uL (ref 3.87–5.11)
RDW: 13.2 % (ref 11.5–15.5)
WBC: 8.4 10*3/uL (ref 4.0–10.5)
nRBC: 0 % (ref 0.0–0.2)

## 2021-06-15 LAB — TROPONIN I (HIGH SENSITIVITY): Troponin I (High Sensitivity): 2 ng/L (ref <18)

## 2021-06-15 LAB — CBG MONITORING, ED: Glucose-Capillary: 83 mg/dL (ref 70–99)

## 2021-06-15 LAB — LIPASE, BLOOD: Lipase: 12 U/L (ref 11–51)

## 2021-06-15 NOTE — ED Provider Notes (Signed)
[facility]ERGENCY [facility]MENT Provider Note   CSN: [id number] Arrival date & time: 06/14/21  1808     History No chief complaint on file.   Carol Schwartz is a 25 y.o. female.  HPI Patient presents for palpitations.  Has been having problems over the last month.  Has been seen multiple times in the ER has been seen by cardiology.  States she will feel her heart race.  However also seems to be associated somewhat with food.  States if she eats fatty food she gets the feeling she has to have a bowel movement.  States she also gets pain sometimes in her chest and back with it.  States sometimes her hands will go numb.  Has been seen by cardiology and had extensive work-up but has not completed with it yet.  No weight loss.  Thyroid work-up was been reassuring.  Abdominal pain will be in various spots but sometimes upper abdomen.States eating meat tends to bother it more.  States she is cut back and eats mostly plant-based. Past Medical History:  Diagnosis Date   Anxiety     Patient Active Problem List   Diagnosis Date Noted   Ovarian cyst 11/13/2020   Dysmenorrhea 11/13/2020    Past Surgical History:  Procedure Laterality Date   LAPAROSCOPY  02/24/2012   Procedure: LAPAROSCOPY OPERATIVE;  Surgeon: Geryl Rankins, MD;  Location: [facility];  Service: Gynecology;  Laterality: N/A;  Drainage of Left Ovarian Cyst   OVARIAN CYST REMOVAL  02/24/2012   Procedure: OVARIAN CYSTECTOMY;  Surgeon: Geryl Rankins, MD;  Location: [facility];  Service: Gynecology;  Laterality: Right;     OB History   No obstetric history on file.     Family History  Problem Relation Age of Onset   Diabetes Father    Heart attack Father 41   Hypertension Other     Social History   Tobacco Use   Smoking status: Never   Smokeless tobacco: Never  Vaping Use   Vaping Use: Never used  Substance Use Topics   Alcohol use: No   Drug use: No    Home Medications Prior to Admission medications    Medication Sig Start Date End Date Taking? Authorizing Provider  hydrOXYzine (ATARAX/VISTARIL) 25 MG tablet Take 1 tablet (25 mg total) by mouth every 8 (eight) hours as needed for up to 30 doses for anxiety. 05/23/21   Terald Sleeper, MD  ibuprofen (ADVIL) 600 MG tablet Take 1,200 mg by mouth 2 (two) times daily as needed. 09/18/20   [provider]  norethindrone (AYGESTIN) 5 MG tablet Take 5 mg by mouth daily. 10/29/20   [provider]    Allergies    Patient has no known allergies.  Review of Systems   Review of Systems  Constitutional:  Negative for appetite change and fever.  HENT:  Negative for congestion.   Respiratory:  Negative for cough.   Cardiovascular:  Positive for palpitations.  Gastrointestinal:  Positive for abdominal pain. Negative for nausea and vomiting.  Genitourinary:  Negative for dysuria.  Musculoskeletal:  Negative for back pain.  Neurological:  Negative for weakness and numbness.  Psychiatric/Behavioral:  The patient is nervous/anxious.    Physical Exam Updated Vital Signs BP 133/69 (BP Location: Right Arm)   Pulse 92   Temp 98.7 F (37.1 C) (Oral)   Resp 18   SpO2 100%   Physical Exam Vitals and nursing note reviewed.  Constitutional:      Appearance:  Normal appearance. She is obese.  HENT:     Head: Normocephalic.  Eyes:     General: No scleral icterus. Cardiovascular:     Rate and Rhythm: Regular rhythm.  Pulmonary:     Breath sounds: No wheezing or rhonchi.  Abdominal:     Tenderness: There is no abdominal tenderness.  Musculoskeletal:        General: No tenderness.     Cervical back: Neck supple.     Right lower leg: No edema.     Left lower leg: No edema.  Skin:    General: Skin is warm.     Capillary Refill: Capillary refill takes less than 2 seconds.  Neurological:     Mental Status: She is oriented to person, place, and time.  Psychiatric:        Mood and Affect: Mood normal.    ED Results /  Procedures / Treatments   Labs (all labs ordered are listed, but only abnormal results are displayed) Labs Reviewed  BASIC METABOLIC PANEL - Abnormal; Notable for the following components:      Result Value   CO2 20 (*)    All other components within normal limits  HEPATIC FUNCTION PANEL - Abnormal; Notable for the following components:   Albumin 3.4 (*)    Total Bilirubin 1.3 (*)    Bilirubin, Direct 0.3 (*)    Indirect Bilirubin 1.0 (*)    All other components within normal limits  CBC  MAGNESIUM  TSH  PHOSPHORUS  LIPASE, BLOOD  TROPONIN I (HIGH SENSITIVITY)  TROPONIN I (HIGH SENSITIVITY)    EKG EKG Interpretation  Date/Time:  Sunday June 14 2021 18:17:16 EST Ventricular Rate:  98 PR Interval:  146 QRS Duration: 78 QT Interval:  340 QTC Calculation: 434 R Axis:   72 Text Interpretation: Normal sinus rhythm Nonspecific T wave abnormality Abnormal ECG Confirmed by Benjiman Core [phone]) on 06/15/2021 8:47:17 AM  Radiology DG Chest 2 View  Result Date: 06/14/2021 CLINICAL DATA:  06/13/2021 EXAM: CHEST - 2 VIEW COMPARISON:  None. FINDINGS: Lungs are well expanded, symmetric, and clear. No pneumothorax or pleural effusion. Cardiac size within normal limits. Ambulatory Holter monitor overlies the left hemithorax. Pulmonary vascularity is normal. Osseous structures are age-appropriate. No acute bone abnormality. IMPRESSION: No active cardiopulmonary disease. Electronically Signed   By: Helyn Numbers M.D.   On: 06/14/2021 20:18    Procedures Procedures   Medications Ordered in ED Medications - No data to display  ED Course  I have reviewed the triage vital signs and the nursing notes.  Pertinent labs & imaging results that were available during my care of the patient were reviewed by me and considered in my medical decision making (see chart for details).    MDM Rules/Calculators/A&P                           Patient with palpitations.  History of same.  Has  been seen by cardiology and evaluated in the ER for it a few times.  Overall low risk.  Has worn a monitor but results were not back to her yet.  Also had echocardiogram and stress test.  Can follow-up with cardiology for that.  Although also has been having some belly pain.  GI symptoms.  Also may be associated with palpitations but not always.  States the pain will go into the back at times.  Seems to be worse after eating meat.  With continued  food the symptoms will have follow-up with GI.  Will discharge home. Final Clinical Impression(s) / ED Diagnoses Final diagnoses:  Palpitations  Food intolerance    Rx / DC Orders ED Discharge Orders     None        Benjiman Core, MD 06/15/21 [phone]

## 2021-06-15 NOTE — ED Triage Notes (Signed)
Pt reports she ate some popcorn and cocoanut cake at 0300 Friday morning. Pt reports after eating she began having weird symptoms.

## 2021-06-15 NOTE — ED Triage Notes (Addendum)
Pt BIB [facility] for ongoing GI concerns and "doesn't feel well" pt has been seen at [facility] and [facility] ED multiple times for the same. Advised to f/u with a GI specialist. Pt has not made the appointment. Pt now reports she's not eating because of ongoing symptoms. EMS encouraged pt to eat bland diet if food was aggravating her stomach   BP 146/90 HR 108 98% RA   CBG 68, ate applesauce w/EMS

## 2021-06-15 NOTE — ED Provider Notes (Signed)
[facility] EMERGENCY DEPARTMENT Provider Note  CSN: [id number] Arrival date & time: 06/15/21 1818    History Chief Complaint  Patient presents with   Fatigue   Abdominal Pain    Carol Schwartz is a 25 y.o. female with multiple vague symptoms ongoing for several days since eating coconut and popcorn Friday. She has been seen 4 times in the ED since then. No new symptoms she is just worried that she might 'fall out' when she is going to sleep.    Past Medical History:  Diagnosis Date   Anxiety     Past Surgical History:  Procedure Laterality Date   LAPAROSCOPY  02/24/2012   Procedure: LAPAROSCOPY OPERATIVE;  Surgeon: Geryl Rankins, MD;  Location: [facility];  Service: Gynecology;  Laterality: N/A;  Drainage of Left Ovarian Cyst   OVARIAN CYST REMOVAL  02/24/2012   Procedure: OVARIAN CYSTECTOMY;  Surgeon: Geryl Rankins, MD;  Location: [facility];  Service: Gynecology;  Laterality: Right;    Family History  Problem Relation Age of Onset   Diabetes Father    Heart attack Father 83   Hypertension Other     Social History   Tobacco Use   Smoking status: Never   Smokeless tobacco: Never  Vaping Use   Vaping Use: Never used  Substance Use Topics   Alcohol use: No   Drug use: No     Home Medications Prior to Admission medications   Medication Sig Start Date End Date Taking? Authorizing Provider  hydrOXYzine (ATARAX/VISTARIL) 25 MG tablet Take 1 tablet (25 mg total) by mouth every 8 (eight) hours as needed for up to 30 doses for anxiety. 05/23/21   Terald Sleeper, MD  ibuprofen (ADVIL) 600 MG tablet Take 1,200 mg by mouth 2 (two) times daily as needed. 09/18/20   [provider]  norethindrone (AYGESTIN) 5 MG tablet Take 5 mg by mouth daily. 10/29/20   [provider]     Allergies    Patient has no known allergies.   Review of Systems   Review of Systems A comprehensive review of systems was completed and negative except as noted in HPI.     Physical Exam BP (!) 147/87   Pulse (!) 103   Temp 98.5 F (36.9 C) (Oral)   Resp 17   SpO2 100%   Physical Exam Vitals and nursing note reviewed.  Constitutional:      Appearance: Normal appearance.  HENT:     Head: Normocephalic and atraumatic.     Nose: Nose normal.     Mouth/Throat:     Mouth: Mucous membranes are moist.  Eyes:     Extraocular Movements: Extraocular movements intact.     Conjunctiva/sclera: Conjunctivae normal.  Cardiovascular:     Rate and Rhythm: Normal rate.  Pulmonary:     Effort: Pulmonary effort is normal.     Breath sounds: Normal breath sounds.  Abdominal:     General: Abdomen is flat.     Palpations: Abdomen is soft.     Tenderness: There is no abdominal tenderness.  Musculoskeletal:        General: No swelling. Normal range of motion.     Cervical back: Neck supple.  Skin:    General: Skin is warm and dry.  Neurological:     General: No focal deficit present.     Mental Status: She is alert.  Psychiatric:        Mood and Affect: Mood normal.     ED  Results / Procedures / Treatments   Labs (all labs ordered are listed, but only abnormal results are displayed) Labs Reviewed  COMPREHENSIVE METABOLIC PANEL - Abnormal; Notable for the following components:      Result Value   CO2 21 (*)    All other components within normal limits  LIPASE, BLOOD  CBC  URINALYSIS, ROUTINE W REFLEX MICROSCOPIC  PREGNANCY, URINE  CBG MONITORING, ED    EKG None   Radiology DG Chest 2 View  Result Date: 06/14/2021 CLINICAL DATA:  06/13/2021 EXAM: CHEST - 2 VIEW COMPARISON:  None. FINDINGS: Lungs are well expanded, symmetric, and clear. No pneumothorax or pleural effusion. Cardiac size within normal limits. Ambulatory Holter monitor overlies the left hemithorax. Pulmonary vascularity is normal. Osseous structures are age-appropriate. No acute bone abnormality. IMPRESSION: No active cardiopulmonary disease. Electronically Signed   By: Helyn Numbers M.D.   On: 06/14/2021 20:18    Procedures Procedures  Medications Ordered in the ED Medications - No data to display   MDM Rules/Calculators/A&P MDM Patient's workup remains unremarkable. Vitals normal. Exam is benign. She was encouraged to call her PCP and schedule an outpatient follow up instead of coming back to the ED for the same symptoms. No emergent medical condition identified.   ED Course  I have reviewed the triage vital signs and the nursing notes.  Pertinent labs & imaging results that were available during my care of the patient were reviewed by me and considered in my medical decision making (see chart for details).     Final Clinical Impression(s) / ED Diagnoses Final diagnoses:  Anxiety    Rx / DC Orders ED Discharge Orders     None        Pollyann Savoy, MD 06/15/21 2246

## 2021-06-15 NOTE — Discharge Instructions (Signed)
Following up with GI could help deal with some of the food issues.  Follow-up with cardiology as planned

## 2021-06-16 ENCOUNTER — Encounter (HOSPITAL_BASED_OUTPATIENT_CLINIC_OR_DEPARTMENT_OTHER): Payer: Self-pay

## 2021-06-16 ENCOUNTER — Emergency Department (HOSPITAL_BASED_OUTPATIENT_CLINIC_OR_DEPARTMENT_OTHER)
Admission: EM | Admit: 2021-06-16 | Discharge: 2021-06-16 | Disposition: A | Discharge status: Home / Self Care | Payer: Medicaid Other | Attending: Emergency Medicine | Admitting: Emergency Medicine

## 2021-06-16 ENCOUNTER — Telehealth: Payer: Self-pay

## 2021-06-16 ENCOUNTER — Other Ambulatory Visit: Payer: Self-pay

## 2021-06-16 ENCOUNTER — Emergency Department (HOSPITAL_BASED_OUTPATIENT_CLINIC_OR_DEPARTMENT_OTHER)
Admission: EM | Admit: 2021-06-16 | Discharge: 2021-06-16 | Disposition: A | Discharge status: Home / Self Care | Payer: Medicaid Other | Source: Home / Self Care | Attending: Emergency Medicine | Admitting: Emergency Medicine

## 2021-06-16 ENCOUNTER — Encounter (HOSPITAL_BASED_OUTPATIENT_CLINIC_OR_DEPARTMENT_OTHER): Payer: Self-pay | Admitting: *Deleted

## 2021-06-16 DIAGNOSIS — R002 Palpitations: Secondary | ICD-10-CM

## 2021-06-16 DIAGNOSIS — R0789 Other chest pain: Secondary | ICD-10-CM | POA: Insufficient documentation

## 2021-06-16 DIAGNOSIS — R1013 Epigastric pain: Secondary | ICD-10-CM | POA: Diagnosis not present

## 2021-06-16 DIAGNOSIS — R1032 Left lower quadrant pain: Secondary | ICD-10-CM | POA: Insufficient documentation

## 2021-06-16 DIAGNOSIS — R52 Pain, unspecified: Secondary | ICD-10-CM

## 2021-06-16 MED ORDER — OMEPRAZOLE 20 MG PO CPDR: 20.0000 mg | DELAYED_RELEASE_CAPSULE | Freq: Every day | ORAL | 1 refills | Status: DC

## 2021-06-16 MED ORDER — ALUM & MAG HYDROXIDE-SIMETH 200-200-20 MG/5ML PO SUSP
30.0000 mL | Freq: Once | ORAL | Status: AC
  Administered 2021-06-16: 30 mL via ORAL

## 2021-06-16 NOTE — ED Provider Notes (Signed)
[facility] Emergency Department Provider Note MRN:  [id number]  Arrival date & time: 06/16/21     Chief Complaint   Palpitations History of Present Illness   Carol Schwartz is a 25 y.o. year-old female with a history of anxiety presenting to the ED with chief complaint of palpitations.  Patient thinks she has a GERD.  Has been experiencing palpitations and discomfort of the chest and epigastrium after eating for the past several days.  No fever, no shortness of breath, no leg pain or swelling.  Described as a burning pain.  Mild in severity, no other exacerbating or alleviating factors.  Review of Systems  A complete 10 system review of systems was obtained and all systems are negative except as noted in the HPI and PMH.   Patient's Health History    Past Medical History:  Diagnosis Date   Anxiety     Past Surgical History:  Procedure Laterality Date   LAPAROSCOPY  02/24/2012   Procedure: LAPAROSCOPY OPERATIVE;  Surgeon: Geryl Rankins, MD;  Location: [facility];  Service: Gynecology;  Laterality: N/A;  Drainage of Left Ovarian Cyst   OVARIAN CYST REMOVAL  02/24/2012   Procedure: OVARIAN CYSTECTOMY;  Surgeon: Geryl Rankins, MD;  Location: [facility];  Service: Gynecology;  Laterality: Right;    Family History  Problem Relation Age of Onset   Diabetes Father    Heart attack Father 58   Hypertension Other     Social History   Socioeconomic History   Marital status: Single    Spouse name: Not on file   Number of children: 0   Years of education: Not on file   Highest education level: Not on file  Occupational History   Not on file  Tobacco Use   Smoking status: Never   Smokeless tobacco: Never  Vaping Use   Vaping Use: Never used  Substance and Sexual Activity   Alcohol use: No   Drug use: No   Sexual activity: Not on file  Other Topics Concern   Not on file  Social History Narrative   Not on file   Social Determinants of Health   Financial  Resource Strain: Not on file  Food Insecurity: Not on file  Transportation Needs: Not on file  Physical Activity: Not on file  Stress: Not on file  Social Connections: Not on file  Intimate Partner Violence: Not on file     Physical Exam   Vitals:   06/16/21 0358 06/16/21 0415  BP: 138/80 116/78  Pulse: (!) 105 99  Resp: 19   Temp: 98.5 F (36.9 C)   SpO2: 99% 91%    CONSTITUTIONAL: Well-appearing, NAD NEURO:  Alert and oriented x 3, no focal deficits EYES:  eyes equal and reactive ENT/NECK:  no LAD, no JVD CARDIO: Regular rate, well-perfused, normal S1 and S2 PULM:  CTAB no wheezing or rhonchi GI/GU:  normal bowel sounds, non-distended, non-tender MSK/SPINE:  No gross deformities, no edema SKIN:  no rash, atraumatic PSYCH:  Appropriate speech and behavior  *Additional and/or pertinent findings included in MDM below  Diagnostic and Interventional Summary    EKG Interpretation  Date/Time:    Ventricular Rate:    PR Interval:    QRS Duration:   QT Interval:    QTC Calculation:   R Axis:     Text Interpretation:         Labs Reviewed - No data to display  No orders to display    Medications  alum & mag hydroxide-simeth (MAALOX/MYLANTA) 200-200-20 MG/5ML suspension 30 mL (30 mLs Oral Given 06/16/21 0415)     Procedures  /  Critical Care Procedures  ED Course and Medical Decision Making  I have reviewed the triage vital signs, the nursing notes, and pertinent available records from the EMR.  Listed above are laboratory and imaging tests that I personally ordered, reviewed, and interpreted and then considered in my medical decision making (see below for details).  Suspect GERD versus anxiety.  Mostly complaining of chronic symptoms, has followed up with PCP and cardiology.  Normal vital signs, well-appearing, providing GI cocktail and will reassess.       Elmer Sow. Pilar Plate, MD [facility] Emergency Medicine [facility]  Health [email or web address]  Final Clinical Impressions(s) / ED Diagnoses     ICD-10-CM   1. Palpitation  R00.2       ED Discharge Orders          Ordered    omeprazole (PRILOSEC) 20 MG capsule  Daily        06/16/21 0443             Discharge Instructions Discussed with and Provided to Patient:     Discharge Instructions      You were evaluated in the Emergency Department and after careful evaluation, we did not find any emergent condition requiring admission or further testing in the hospital.  Your exam/testing today was overall reassuring.  Symptoms may be due to acid reflux.  Take the omeprazole as directed and follow-up with your regular doctor.  Please return to the Emergency Department if you experience any worsening of your condition.  Thank you for allowing [place] to be a part of your care.         Sabas Sous, MD 06/16/21 [phone]

## 2021-06-16 NOTE — Discharge Instructions (Signed)
You were evaluated in the Emergency Department and after careful evaluation, we did not find any emergent condition requiring admission or further testing in the hospital.  Your exam/testing today was overall reassuring.  Recommend filling the prescription for omeprazole and talking to someone about possible anxiety.  Continue follow-up with your primary care doctor and cardiologist.  Please return to the Emergency Department if you experience any worsening of your condition.  Thank you for allowing [place] to be a part of your care.

## 2021-06-16 NOTE — ED Notes (Signed)
Pt BIB [facility] for palpitations. EMS states pt has had multiple visits for same. On scene pt's mother was adamantly pushing pt to be seen in ED for various symptoms. Pt states she is not having palpitations now, but is having "pain in different areas."

## 2021-06-16 NOTE — ED Triage Notes (Signed)
Pt reports all over pain, in her back that goes down through her buttocks and legs and c/o gas pains.

## 2021-06-16 NOTE — Progress Notes (Signed)
Called pt to inform her about her echo results. Pt understood.

## 2021-06-16 NOTE — Telephone Encounter (Signed)
Patient called about her heart palpitations. She has been seen in the ED four times for this.

## 2021-06-16 NOTE — ED Notes (Signed)
Discharge instructions discussed with pt. Pt verbalized understanding with no questions at this time.  

## 2021-06-16 NOTE — Discharge Instructions (Addendum)
You were evaluated in the Emergency Department and after careful evaluation, we did not find any emergent condition requiring admission or further testing in the hospital.  Your exam/testing today was overall reassuring.  Symptoms may be due to acid reflux.  Take the omeprazole as directed and follow-up with your regular doctor.  Please return to the Emergency Department if you experience any worsening of your condition.  Thank you for allowing [place] to be a part of your care.

## 2021-06-16 NOTE — ED Provider Notes (Signed)
[facility] EMERGENCY [facility] Emergency Department Provider Note MRN:  [id number]  Arrival date & time: 06/16/21     Chief Complaint   Body aches History of Present Illness   Carol Schwartz is a 25 y.o. year-old female with no pertinent past medical history presenting to the ED with chief complaint of body aches.  Weeks to months of intermittent pain to the neck, back, chest, feet, arms, left upper quadrant which quickly migrates to the right upper quadrant.  Explains that over the past year she has had some abdominal upset after certain foods.  Has followed up with her primary care doctor and told that she has stress or anxiety.  Has followed up with cardiology and has a cardiac monitor in place.  Here this evening because the pain continued.  Symptoms are intermittent, mild to moderate, no exacerbating or alleviating factors.  No shortness of breath, no syncopal episodes, no fever, no cough, no burning with urination.  Review of Systems  A complete 10 system review of systems was obtained and all systems are negative except as noted in the HPI and PMH.   Patient's Health History    Past Medical History:  Diagnosis Date   Anxiety     Past Surgical History:  Procedure Laterality Date   LAPAROSCOPY  02/24/2012   Procedure: LAPAROSCOPY OPERATIVE;  Surgeon: Geryl Rankins, MD;  Location: [facility];  Service: Gynecology;  Laterality: N/A;  Drainage of Left Ovarian Cyst   OVARIAN CYST REMOVAL  02/24/2012   Procedure: OVARIAN CYSTECTOMY;  Surgeon: Geryl Rankins, MD;  Location: [facility];  Service: Gynecology;  Laterality: Right;    Family History  Problem Relation Age of Onset   Diabetes Father    Heart attack Father 79   Hypertension Other     Social History   Socioeconomic History   Marital status: Single    Spouse name: Not on file   Number of children: 0   Years of education: Not on file   Highest education level: Not on file  Occupational History   Not on file  Tobacco Use    Smoking status: Never   Smokeless tobacco: Never  Vaping Use   Vaping Use: Never used  Substance and Sexual Activity   Alcohol use: No   Drug use: No   Sexual activity: Not Currently  Other Topics Concern   Not on file  Social History Narrative   Not on file   Social Determinants of Health   Financial Resource Strain: Not on file  Food Insecurity: Not on file  Transportation Needs: Not on file  Physical Activity: Not on file  Stress: Not on file  Social Connections: Not on file  Intimate Partner Violence: Not on file     Physical Exam   Vitals:   06/16/21 2300  BP: (!) 144/81  Pulse: 95  Resp: (!) 22  Temp: 98.6 F (37 C)  SpO2: 99%    CONSTITUTIONAL: Well-appearing, NAD NEURO:  Alert and oriented x 3, no focal deficits EYES:  eyes equal and reactive ENT/NECK:  no LAD, no JVD CARDIO: Regular rate, well-perfused, normal S1 and S2 PULM:  CTAB no wheezing or rhonchi GI/GU:  normal bowel sounds, non-distended, non-tender MSK/SPINE:  No gross deformities, no edema SKIN:  no rash, atraumatic PSYCH:  Appropriate speech and behavior  *Additional and/or pertinent findings included in MDM below  Diagnostic and Interventional Summary    EKG Interpretation  Date/Time:    Ventricular Rate:    PR Interval:  QRS Duration:   QT Interval:    QTC Calculation:   R Axis:     Text Interpretation:         Labs Reviewed - No data to display  No orders to display    Medications - No data to display   Procedures  /  Critical Care Procedures  ED Course and Medical Decision Making  I have reviewed the triage vital signs, the nursing notes, and pertinent available records from the EMR.  Listed above are laboratory and imaging tests that I personally ordered, reviewed, and interpreted and then considered in my medical decision making (see below for details).  Sixth visit to the emergency department over the past 5 or 6 days.  Has been reassured multiple times.   Normal EKG, chest x-ray, labs, negative troponin recently.  Symptoms not really changing this evening, just not going away.  Chest pain is reproducible with palpation, focally on the left chest, favoring MSK.  Could also be GERD given her left upper quadrant discomfort with certain meals.  She has had recent EKG which is reassuring, vital signs normal, no hypoxia, no evidence of DVT, highly doubt PE or any other cardiopulmonary process.  Appropriate for discharge, again with reassurance.       Elmer Sow. Pilar Plate, MD [facility] Emergency Medicine [facility] [email or web address]  Final Clinical Impressions(s) / ED Diagnoses     ICD-10-CM   1. Body aches  R52       ED Discharge Orders     None        Discharge Instructions Discussed with and Provided to Patient:    Discharge Instructions      You were evaluated in the Emergency Department and after careful evaluation, we did not find any emergent condition requiring admission or further testing in the hospital.  Your exam/testing today was overall reassuring.  Recommend filling the prescription for omeprazole and talking to someone about possible anxiety.  Continue follow-up with your primary care doctor and cardiologist.  Please return to the Emergency Department if you experience any worsening of your condition.  Thank you for allowing [place] to be a part of your care.        Sabas Sous, MD 06/16/21 [phone]

## 2021-06-17 ENCOUNTER — Emergency Department (HOSPITAL_BASED_OUTPATIENT_CLINIC_OR_DEPARTMENT_OTHER)
Admission: EM | Admit: 2021-06-17 | Discharge: 2021-06-18 | Disposition: A | Discharge status: Home / Self Care | Payer: Medicaid Other | Source: Home / Self Care | Attending: Emergency Medicine | Admitting: Emergency Medicine

## 2021-06-17 ENCOUNTER — Encounter (HOSPITAL_BASED_OUTPATIENT_CLINIC_OR_DEPARTMENT_OTHER): Payer: Self-pay | Admitting: *Deleted

## 2021-06-17 ENCOUNTER — Other Ambulatory Visit: Payer: Self-pay

## 2021-06-17 ENCOUNTER — Encounter (HOSPITAL_BASED_OUTPATIENT_CLINIC_OR_DEPARTMENT_OTHER): Payer: Self-pay

## 2021-06-17 ENCOUNTER — Emergency Department (HOSPITAL_BASED_OUTPATIENT_CLINIC_OR_DEPARTMENT_OTHER)
Admission: EM | Admit: 2021-06-17 | Discharge: 2021-06-17 | Disposition: A | Discharge status: Home / Self Care | Payer: Medicaid Other | Attending: Emergency Medicine | Admitting: Emergency Medicine

## 2021-06-17 ENCOUNTER — Emergency Department (HOSPITAL_BASED_OUTPATIENT_CLINIC_OR_DEPARTMENT_OTHER)
Admission: EM | Admit: 2021-06-17 | Discharge: 2021-06-17 | Disposition: A | Discharge status: Home / Self Care | Payer: Medicaid Other | Source: Home / Self Care | Attending: Emergency Medicine | Admitting: Emergency Medicine

## 2021-06-17 DIAGNOSIS — R070 Pain in throat: Secondary | ICD-10-CM | POA: Insufficient documentation

## 2021-06-17 DIAGNOSIS — R079 Chest pain, unspecified: Secondary | ICD-10-CM

## 2021-06-17 DIAGNOSIS — R109 Unspecified abdominal pain: Secondary | ICD-10-CM | POA: Insufficient documentation

## 2021-06-17 DIAGNOSIS — K219 Gastro-esophageal reflux disease without esophagitis: Secondary | ICD-10-CM | POA: Insufficient documentation

## 2021-06-17 MED ORDER — LORAZEPAM 1 MG PO TABS
2.0000 mg | ORAL_TABLET | Freq: Once | ORAL | Status: AC
  Administered 2021-06-17: 2 mg via ORAL
  Filled 2021-06-17: qty 2

## 2021-06-17 MED ORDER — SUCRALFATE 1 G PO TABS: 1.0000 g | ORAL_TABLET | Freq: Three times a day (TID) | ORAL | 0 refills | Status: DC

## 2021-06-17 NOTE — Discharge Instructions (Signed)
As discussed today please take omeprazole and follow-up with your gastroenterologist at your scheduled appointment.  If your symptoms do not improve please follow-up with your primary care provider.  You can return to the emergency department if you develop chest pain, shortness of breath.

## 2021-06-17 NOTE — ED Provider Notes (Signed)
[facility] EMERGENCY DEPT Provider Note   CSN: [id number] Arrival date & time: 06/17/21  1902     History Chief Complaint  Patient presents with   Allergic Reaction    Carol Schwartz is a 25 y.o. female.  Patient is a 25 year old female presenting with complaints of burning in her throat and stomach.  Patient states she ate coconut cake last Friday and believes she is having a reaction to this.  She has been seen here now 9 times in the past week and this is her third visit today with similar complaints.  She has undergone extensive cardiac work-up and is currently wearing a Zio patch.  She returns this evening stating that she felt burning in her throat as she laid down to go to sleep.  The history is provided by the patient.      Past Medical History:  Diagnosis Date   Anxiety     Patient Active Problem List   Diagnosis Date Noted   Ovarian cyst 11/13/2020   Dysmenorrhea 11/13/2020    Past Surgical History:  Procedure Laterality Date   LAPAROSCOPY  02/24/2012   Procedure: LAPAROSCOPY OPERATIVE;  Surgeon: Geryl Rankins, MD;  Location: [facility];  Service: Gynecology;  Laterality: N/A;  Drainage of Left Ovarian Cyst   OVARIAN CYST REMOVAL  02/24/2012   Procedure: OVARIAN CYSTECTOMY;  Surgeon: Geryl Rankins, MD;  Location: [facility];  Service: Gynecology;  Laterality: Right;     OB History   No obstetric history on file.     Family History  Problem Relation Age of Onset   Diabetes Father    Heart attack Father 69   Hypertension Other     Social History   Tobacco Use   Smoking status: Never   Smokeless tobacco: Never  Vaping Use   Vaping Use: Never used  Substance Use Topics   Alcohol use: No   Drug use: No    Home Medications Prior to Admission medications   Medication Sig Start Date End Date Taking? Authorizing Provider  hydrOXYzine (ATARAX/VISTARIL) 25 MG tablet Take 1 tablet (25 mg total) by mouth every 8 (eight) hours as needed for up to  30 doses for anxiety. 05/23/21   Terald Sleeper, MD  ibuprofen (ADVIL) 600 MG tablet Take 1,200 mg by mouth 2 (two) times daily as needed. 09/18/20   [provider]  norethindrone (AYGESTIN) 5 MG tablet Take 5 mg by mouth daily. 10/29/20   [provider]  omeprazole (PRILOSEC) 20 MG capsule Take 1 capsule (20 mg total) by mouth daily. 06/16/21   Sabas Sous, MD    Allergies    Patient has no known allergies.  Review of Systems   Review of Systems  All other systems reviewed and are negative.  Physical Exam Updated Vital Signs BP 133/79   Pulse 95   Temp 98.2 F (36.8 C)   Ht 5\' 8"  (1.727 m)   Wt 127 kg   LMP 04/12/2019   SpO2 100%   BMI 42.57 kg/m   Physical Exam Vitals and nursing note reviewed.  Constitutional:      General: She is not in acute distress.    Appearance: She is well-developed. She is not diaphoretic.  HENT:     Head: Normocephalic and atraumatic.  Cardiovascular:     Rate and Rhythm: Normal rate and regular rhythm.     Heart sounds: No murmur heard.   No friction rub. No gallop.  Pulmonary:  Effort: Pulmonary effort is normal. No respiratory distress.     Breath sounds: Normal breath sounds. No wheezing.  Abdominal:     General: Bowel sounds are normal. There is no distension.     Palpations: Abdomen is soft.     Tenderness: There is no abdominal tenderness.  Musculoskeletal:        General: Normal range of motion.     Cervical back: Normal range of motion and neck supple.  Skin:    General: Skin is warm and dry.  Neurological:     General: No focal deficit present.     Mental Status: She is alert and oriented to person, place, and time.    ED Results / Procedures / Treatments   Labs (all labs ordered are listed, but only abnormal results are displayed) Labs Reviewed - No data to display  EKG None  Radiology No results found.  Procedures Procedures   Medications Ordered in ED Medications - No data to  display  ED Course  I have reviewed the triage vital signs and the nursing notes.  Pertinent labs & imaging results that were available during my care of the patient were reviewed by me and considered in my medical decision making (see chart for details).    MDM Rules/Calculators/A&P  Patient's physical exam and vital signs unremarkable.  Her previous visits were reviewed.  She had laboratory studies performed 2 days ago and I do not feel as though these need to be repeated.  Patient advised to continue her omeprazole.  I will add Carafate and have advised her to touch base with her primary doctor tomorrow regarding GI referral.  Final Clinical Impression(s) / ED Diagnoses Final diagnoses:  None    Rx / DC Orders ED Discharge Orders     None        Geoffery Lyons, MD 06/17/21 2350

## 2021-06-17 NOTE — ED Triage Notes (Signed)
Patient believes she is still having some type of reaction to coconut cake that she ate last Friday.    States she feels like her throat is getting hard.

## 2021-06-17 NOTE — Discharge Instructions (Signed)
You were evaluated in the Emergency Department and after careful evaluation, we did not find any emergent condition requiring admission or further testing in the hospital.  Your exam/testing today was overall reassuring.  Please return to the Emergency Department if you experience any worsening of your condition.  Thank you for allowing us to be a part of your care.  

## 2021-06-17 NOTE — ED Provider Notes (Signed)
[facility] EMERGENCY DEPT Provider Note   CSN: [id number] Arrival date & time: 06/17/21  1342     History Chief Complaint  Patient presents with   Gastroesophageal Reflux    Carol Schwartz is a 25 y.o. female.  25 year old female who has been seen 7 times since 12/3 presents today for evaluation of acid reflux.  Patient was seen for similar symptoms prior and was referred to GI.  She reports she has an appointment scheduled with GI for 12/20, and was evaluated by her PCP yesterday who felt her symptoms were anxiety related and not acid reflux.  She denies worsening in her symptoms.  She was prescribed omeprazole which she just picked up today and has with her in the emergency room.  She has not taken this yet.  Today she denies chest pain, shortness of breath, nausea, vomiting, or abdominal pain.  She does report certain foods make her symptoms worse.  The history is provided by the patient. No language interpreter was used.      Past Medical History:  Diagnosis Date   Anxiety     Patient Active Problem List   Diagnosis Date Noted   Ovarian cyst 11/13/2020   Dysmenorrhea 11/13/2020    Past Surgical History:  Procedure Laterality Date   LAPAROSCOPY  02/24/2012   Procedure: LAPAROSCOPY OPERATIVE;  Surgeon: Geryl Rankins, MD;  Location: [facility];  Service: Gynecology;  Laterality: N/A;  Drainage of Left Ovarian Cyst   OVARIAN CYST REMOVAL  02/24/2012   Procedure: OVARIAN CYSTECTOMY;  Surgeon: Geryl Rankins, MD;  Location: [facility];  Service: Gynecology;  Laterality: Right;     OB History   No obstetric history on file.     Family History  Problem Relation Age of Onset   Diabetes Father    Heart attack Father 16   Hypertension Other     Social History   Tobacco Use   Smoking status: Never   Smokeless tobacco: Never  Vaping Use   Vaping Use: Never used  Substance Use Topics   Alcohol use: No   Drug use: No    Home Medications Prior to Admission  medications   Medication Sig Start Date End Date Taking? Authorizing Provider  hydrOXYzine (ATARAX/VISTARIL) 25 MG tablet Take 1 tablet (25 mg total) by mouth every 8 (eight) hours as needed for up to 30 doses for anxiety. 05/23/21   Terald Sleeper, MD  ibuprofen (ADVIL) 600 MG tablet Take 1,200 mg by mouth 2 (two) times daily as needed. 09/18/20   [provider]  norethindrone (AYGESTIN) 5 MG tablet Take 5 mg by mouth daily. 10/29/20   [provider]  omeprazole (PRILOSEC) 20 MG capsule Take 1 capsule (20 mg total) by mouth daily. 06/16/21   Sabas Sous, MD    Allergies    Patient has no known allergies.  Review of Systems   Review of Systems  Constitutional:  Negative for activity change, chills and fever.  Respiratory:  Negative for shortness of breath.   Cardiovascular:  Negative for chest pain.  Gastrointestinal:  Negative for abdominal pain, nausea and vomiting.  All other systems reviewed and are negative.  Physical Exam Updated Vital Signs BP (!) 158/103 (BP Location: Right Wrist)   Pulse (!) 107   Temp 98.1 F (36.7 C)   Resp 16   SpO2 100%   Physical Exam Vitals and nursing note reviewed.  Constitutional:      General: She is not in acute distress.  Appearance: Normal appearance. She is not ill-appearing.  HENT:     Head: Normocephalic and atraumatic.     Nose: Nose normal.  Eyes:     General: No scleral icterus.    Extraocular Movements: Extraocular movements intact.     Conjunctiva/sclera: Conjunctivae normal.  Cardiovascular:     Rate and Rhythm: Normal rate and regular rhythm.     Pulses: Normal pulses.     Heart sounds: Normal heart sounds.  Pulmonary:     Effort: Pulmonary effort is normal. No respiratory distress.     Breath sounds: Normal breath sounds. No wheezing or rales.  Abdominal:     General: There is no distension.     Tenderness: There is no abdominal tenderness. There is no guarding.  Musculoskeletal:         General: Normal range of motion.     Cervical back: Normal range of motion.  Skin:    General: Skin is warm and dry.  Neurological:     General: No focal deficit present.     Mental Status: She is alert. Mental status is at baseline.    ED Results / Procedures / Treatments   Labs (all labs ordered are listed, but only abnormal results are displayed) Labs Reviewed - No data to display  EKG None  Radiology No results found.  Procedures Procedures   Medications Ordered in ED Medications - No data to display  ED Course  I have reviewed the triage vital signs and the nursing notes.  Pertinent labs & imaging results that were available during my care of the patient were reviewed by me and considered in my medical decision making (see chart for details).    MDM Rules/Calculators/A&P                           25 year old female presents today for evaluation of acid reflux symptoms.  Of note she has been seen 7 times since 12/3.  She denies her symptoms worsen she was last evaluated overnight.  She does have history of palpitation and currently has a Zio patch monitor on.  She follows with cardiology for this.  She picked up her omeprazole but has not started taking it.  Discussed with patient the importance of waiting for her GI evaluation.  Discussed importance of taking omeprazole in the meantime.  Discussed following up with her primary care provider if her symptoms do not improve.  Return precautions for emergency department discussed with patient.  She voices understanding and is in agreement with plan.  Final Clinical Impression(s) / ED Diagnoses Final diagnoses:  Gastroesophageal reflux disease, unspecified whether esophagitis present    Rx / DC Orders ED Discharge Orders     None        Marita Kansas, PA-C 06/17/21 1614    Gwyneth Sprout, MD 06/20/21 [phone]

## 2021-06-17 NOTE — ED Triage Notes (Signed)
Presents via [facility] with complaints of having GERD signs and symptoms. Was seen last PM for the same symptoms

## 2021-06-17 NOTE — ED Notes (Signed)
Presents via [facility] with same complaints as last PM, anxiety and GERD. Calm and cooperative via EMS. VSS per EMS

## 2021-06-17 NOTE — ED Triage Notes (Signed)
Pt arrives via [facility] from home. VSS en route, pt ambulatory to triage room from ambulance bay. Pt called out for anxiety.    Pt states that she feels anxious like she might have a heart attack when she goes to sleep because she ate popcorn and it feels like acid coming up. Alert/oriented.

## 2021-06-17 NOTE — Discharge Instructions (Signed)
Continue omeprazole as previously prescribed.  Begin taking Carafate as prescribed today.  Call your primary doctor tomorrow for GI referral.

## 2021-06-17 NOTE — ED Provider Notes (Signed)
[facility] EMERGENCY [facility] Emergency Department Provider Note MRN:  [id number]  Arrival date & time: 06/17/21     Chief Complaint   Gastroesophageal Reflux   History of Present Illness   Karolyna Bianchini is a 25 y.o. year-old female with no pertinent past medical history presenting to the ED with chief complaint of GERD.  Patient returns with worsening of recent symptoms.  Feeling a burning in the chest and upper abdomen, feels like the popcorn that she had earlier in the week is coming back up.  Still tastes popcorn, not sure if the popcorn stuck in her gums.  Started feeling really bad and wanted to be evaluated again.  Still has not filled her prescriptions.  Review of Systems  A complete 10 system review of systems was obtained and all systems are negative except as noted in the HPI and PMH.   Patient's Health History    Past Medical History:  Diagnosis Date   Anxiety     Past Surgical History:  Procedure Laterality Date   LAPAROSCOPY  02/24/2012   Procedure: LAPAROSCOPY OPERATIVE;  Surgeon: Geryl Rankins, MD;  Location: [facility];  Service: Gynecology;  Laterality: N/A;  Drainage of Left Ovarian Cyst   OVARIAN CYST REMOVAL  02/24/2012   Procedure: OVARIAN CYSTECTOMY;  Surgeon: Geryl Rankins, MD;  Location: [facility];  Service: Gynecology;  Laterality: Right;    Family History  Problem Relation Age of Onset   Diabetes Father    Heart attack Father 60   Hypertension Other     Social History   Socioeconomic History   Marital status: Single    Spouse name: Not on file   Number of children: 0   Years of education: Not on file   Highest education level: Not on file  Occupational History   Not on file  Tobacco Use   Smoking status: Never   Smokeless tobacco: Never  Vaping Use   Vaping Use: Never used  Substance and Sexual Activity   Alcohol use: No   Drug use: No   Sexual activity: Not Currently  Other Topics Concern   Not on file  Social History Narrative    Not on file   Social Determinants of Health   Financial Resource Strain: Not on file  Food Insecurity: Not on file  Transportation Needs: Not on file  Physical Activity: Not on file  Stress: Not on file  Social Connections: Not on file  Intimate Partner Violence: Not on file     Physical Exam   Vitals:   06/17/21 0404 06/17/21 0554  BP: 138/84 132/77  Pulse: 90 82  Resp: 20 16  Temp: 98.4 F (36.9 C)   SpO2: 99% 98%    CONSTITUTIONAL: Well-appearing, NAD NEURO:  Alert and oriented x 3, no focal deficits EYES:  eyes equal and reactive ENT/NECK:  no LAD, no JVD CARDIO: Regular rate, well-perfused, normal S1 and S2 PULM:  CTAB no wheezing or rhonchi GI/GU:  normal bowel sounds, non-distended, non-tender MSK/SPINE:  No gross deformities, no edema SKIN:  no rash, atraumatic PSYCH:  Appropriate speech and behavior  *Additional and/or pertinent findings included in MDM below  Diagnostic and Interventional Summary    EKG Interpretation  Date/Time:    Ventricular Rate:    PR Interval:    QRS Duration:   QT Interval:    QTC Calculation:   R Axis:     Text Interpretation:         Labs Reviewed - No data  to display  No orders to display    Medications  LORazepam (ATIVAN) tablet 2 mg (2 mg Oral Given 06/17/21 0421)     Procedures  /  Critical Care Procedures  ED Course and Medical Decision Making  I have reviewed the triage vital signs, the nursing notes, and pertinent available records from the EMR.  Listed above are laboratory and imaging tests that I personally ordered, reviewed, and interpreted and then considered in my medical decision making (see below for details).  Now the seventh emergency department visit over the past few days, again patient is with normal vital signs, no increased work of breathing, pulses regular, no murmurs on exam, normal neurological exam, soft abdomen, clear lungs.  No evidence of leg swelling or signs of DVT.  Highly suspect  anxiety versus GERD, providing Ativan and will reassess.     Feeling better, appropriate for discharge.  Elmer Sow. Pilar Plate, MD [facility] Emergency Medicine [facility] [email or web address]  Final Clinical Impressions(s) / ED Diagnoses     ICD-10-CM   1. Chest pain, unspecified type  R07.9       ED Discharge Orders     None        Discharge Instructions Discussed with and Provided to Patient:     Discharge Instructions      You were evaluated in the Emergency Department and after careful evaluation, we did not find any emergent condition requiring admission or further testing in the hospital.  Your exam/testing today was overall reassuring.  Please return to the Emergency Department if you experience any worsening of your condition.  Thank you for allowing [place] to be a part of your care.         Sabas Sous, MD 06/17/21 [phone]

## 2021-06-18 ENCOUNTER — Other Ambulatory Visit: Payer: Self-pay

## 2021-06-18 ENCOUNTER — Encounter (HOSPITAL_BASED_OUTPATIENT_CLINIC_OR_DEPARTMENT_OTHER): Payer: Self-pay | Admitting: Emergency Medicine

## 2021-06-18 ENCOUNTER — Emergency Department (HOSPITAL_BASED_OUTPATIENT_CLINIC_OR_DEPARTMENT_OTHER)
Admission: EM | Admit: 2021-06-18 | Discharge: 2021-06-18 | Disposition: A | Discharge status: Home / Self Care | Payer: Medicaid Other | Attending: Emergency Medicine | Admitting: Emergency Medicine

## 2021-06-18 ENCOUNTER — Encounter (HOSPITAL_BASED_OUTPATIENT_CLINIC_OR_DEPARTMENT_OTHER): Payer: Self-pay | Admitting: *Deleted

## 2021-06-18 ENCOUNTER — Emergency Department (HOSPITAL_BASED_OUTPATIENT_CLINIC_OR_DEPARTMENT_OTHER)
Admission: EM | Admit: 2021-06-18 | Discharge: 2021-06-18 | Disposition: A | Discharge status: Home / Self Care | Payer: Medicaid Other | Source: Home / Self Care | Attending: Emergency Medicine | Admitting: Emergency Medicine

## 2021-06-18 DIAGNOSIS — N939 Abnormal uterine and vaginal bleeding, unspecified: Secondary | ICD-10-CM | POA: Insufficient documentation

## 2021-06-18 DIAGNOSIS — R102 Pelvic and perineal pain: Secondary | ICD-10-CM | POA: Insufficient documentation

## 2021-06-18 DIAGNOSIS — R12 Heartburn: Secondary | ICD-10-CM | POA: Insufficient documentation

## 2021-06-18 DIAGNOSIS — K219 Gastro-esophageal reflux disease without esophagitis: Secondary | ICD-10-CM | POA: Insufficient documentation

## 2021-06-18 DIAGNOSIS — N938 Other specified abnormal uterine and vaginal bleeding: Secondary | ICD-10-CM | POA: Insufficient documentation

## 2021-06-18 DIAGNOSIS — M79651 Pain in right thigh: Secondary | ICD-10-CM | POA: Insufficient documentation

## 2021-06-18 DIAGNOSIS — F419 Anxiety disorder, unspecified: Secondary | ICD-10-CM | POA: Insufficient documentation

## 2021-06-18 DIAGNOSIS — K2100 Gastro-esophageal reflux disease with esophagitis, without bleeding: Secondary | ICD-10-CM | POA: Diagnosis present

## 2021-06-18 DIAGNOSIS — R002 Palpitations: Secondary | ICD-10-CM | POA: Insufficient documentation

## 2021-06-18 DIAGNOSIS — M79652 Pain in left thigh: Secondary | ICD-10-CM | POA: Diagnosis not present

## 2021-06-18 LAB — URINALYSIS, ROUTINE W REFLEX MICROSCOPIC
Glucose, UA: NEGATIVE mg/dL
Ketones, ur: >80 mg/dL — AB
Leukocytes,Ua: NEGATIVE
Nitrite: NEGATIVE
Protein, ur: 100 mg/dL — AB
Specific Gravity, Urine: 1.034 — ABNORMAL HIGH (ref 1.005–1.030)
pH: 6.5 (ref 5.0–8.0)

## 2021-06-18 LAB — WET PREP, GENITAL
Clue Cells Wet Prep HPF POC: NONE SEEN
Sperm: NONE SEEN
Trich, Wet Prep: NONE SEEN
WBC, Wet Prep HPF POC: <10 (ref <10)
Yeast Wet Prep HPF POC: NONE SEEN

## 2021-06-18 LAB — PREGNANCY, URINE: Preg Test, Ur: NEGATIVE

## 2021-06-18 MED ORDER — FAMOTIDINE 20 MG PO TABS: 20.0000 mg | ORAL_TABLET | Freq: Once | ORAL | Status: DC

## 2021-06-18 NOTE — Discharge Instructions (Signed)
Take Prilosec daily as discussed and continue to follow-up for your gastroenterology appointment.

## 2021-06-18 NOTE — ED Notes (Addendum)
Pt informed that we need to collect a urine sample. Pt states she does not need to urinate at this moment. Pt given PO liquids and encouraged to drink.

## 2021-06-18 NOTE — ED Provider Notes (Signed)
[facility] EMERGENCY DEPT Provider Note   CSN: [id number] Arrival date & time: 06/18/21  1303     History Chief Complaint  Patient presents with   Vaginal Bleeding    Carol Schwartz is a 25 y.o. female.  The history is provided by the patient and medical records. No language interpreter was used.  Vaginal Bleeding Quality:  Lighter than menses and spotting Severity:  Mild Onset quality:  Gradual Duration:  2 days Timing:  Sporadic Progression:  Unchanged Chronicity:  New Menstrual history:  Irregular Possible pregnancy: no   Relieved by:  Nothing Worsened by:  Nothing Ineffective treatments:  None tried Associated symptoms: vaginal discharge   Associated symptoms: no abdominal pain, no back pain, no dizziness, no dysuria, no fatigue, no fever and no nausea       Past Medical History:  Diagnosis Date   Anxiety     Patient Active Problem List   Diagnosis Date Noted   Ovarian cyst 11/13/2020   Dysmenorrhea 11/13/2020    Past Surgical History:  Procedure Laterality Date   LAPAROSCOPY  02/24/2012   Procedure: LAPAROSCOPY OPERATIVE;  Surgeon: Geryl Rankins, MD;  Location: [facility];  Service: Gynecology;  Laterality: N/A;  Drainage of Left Ovarian Cyst   OVARIAN CYST REMOVAL  02/24/2012   Procedure: OVARIAN CYSTECTOMY;  Surgeon: Geryl Rankins, MD;  Location: [facility];  Service: Gynecology;  Laterality: Right;     OB History   No obstetric history on file.     Family History  Problem Relation Age of Onset   Diabetes Father    Heart attack Father 57   Hypertension Other     Social History   Tobacco Use   Smoking status: Never   Smokeless tobacco: Never  Vaping Use   Vaping Use: Never used  Substance Use Topics   Alcohol use: No   Drug use: No    Home Medications Prior to Admission medications   Medication Sig Start Date End Date Taking? Authorizing Provider  hydrOXYzine (ATARAX/VISTARIL) 25 MG tablet Take 1 tablet (25 mg total) by  mouth every 8 (eight) hours as needed for up to 30 doses for anxiety. 05/23/21   Terald Sleeper, MD  ibuprofen (ADVIL) 600 MG tablet Take 1,200 mg by mouth 2 (two) times daily as needed. 09/18/20   [provider]  norethindrone (AYGESTIN) 5 MG tablet Take 5 mg by mouth daily. 10/29/20   [provider]  omeprazole (PRILOSEC) 20 MG capsule Take 1 capsule (20 mg total) by mouth daily. 06/16/21   Sabas Sous, MD  sucralfate (CARAFATE) 1 g tablet Take 1 tablet (1 g total) by mouth 4 (four) times daily -  with meals and at bedtime. 06/17/21   Geoffery Lyons, MD    Allergies    Patient has no known allergies.  Review of Systems   Review of Systems  Constitutional:  Negative for chills, fatigue and fever.  HENT:  Negative for congestion.   Respiratory:  Negative for cough, chest tightness, shortness of breath and wheezing.   Cardiovascular:  Negative for chest pain.  Gastrointestinal:  Negative for abdominal pain, constipation, diarrhea, nausea and vomiting.  Genitourinary:  Positive for vaginal bleeding and vaginal discharge. Negative for dysuria, flank pain, frequency, hematuria, pelvic pain and vaginal pain.  Musculoskeletal:  Negative for back pain, neck pain and neck stiffness.  Skin:  Negative for rash and wound.  Neurological:  Negative for dizziness and headaches.  Psychiatric/Behavioral:  Negative for agitation.  All other systems reviewed and are negative.  Physical Exam Updated Vital Signs BP 138/77 (BP Location: Right Arm)   Pulse 100   Temp 98.6 F (37 C) (Oral)   Resp 17   SpO2 100%   Physical Exam Vitals and nursing note reviewed. Exam conducted with a chaperone present.  Constitutional:      General: She is not in acute distress.    Appearance: She is well-developed. She is not ill-appearing, toxic-appearing or diaphoretic.  HENT:     Head: Normocephalic and atraumatic.     Mouth/Throat:     Mouth: Mucous membranes are moist.  Eyes:      Extraocular Movements: Extraocular movements intact.     Conjunctiva/sclera: Conjunctivae normal.     Pupils: Pupils are equal, round, and reactive to light.  Cardiovascular:     Rate and Rhythm: Normal rate and regular rhythm.     Heart sounds: No murmur heard. Pulmonary:     Effort: Pulmonary effort is normal. No respiratory distress.     Breath sounds: Normal breath sounds. No wheezing, rhonchi or rales.  Abdominal:     General: Abdomen is flat.     Palpations: Abdomen is soft.     Tenderness: There is no abdominal tenderness. There is no right CVA tenderness, left CVA tenderness, guarding or rebound.  Genitourinary:    Labia:        Right: No rash, tenderness, lesion or injury.        Left: No rash, tenderness, lesion or injury.      Vagina: Vaginal discharge present.     Comments: External exam performed and swabs were collected on the small amount of discharge seen.  No bleeding seen.  No tenderness.  No evidence of abscesses.  Patient reports he is not sexually active and did not tolerate speculum exam previously.  We agreed together to avoid it. Musculoskeletal:        General: No swelling or tenderness.     Cervical back: Neck supple. No tenderness.     Right lower leg: No edema.     Left lower leg: No edema.  Skin:    General: Skin is warm and dry.     Capillary Refill: Capillary refill takes less than 2 seconds.     Findings: No erythema.  Neurological:     General: No focal deficit present.     Mental Status: She is alert.     Sensory: No sensory deficit.     Motor: No weakness.  Psychiatric:        Mood and Affect: Mood normal.    ED Results / Procedures / Treatments   Labs (all labs ordered are listed, but only abnormal results are displayed) Labs Reviewed  URINALYSIS, ROUTINE W REFLEX MICROSCOPIC - Abnormal; Notable for the following components:      Result Value   Specific Gravity, Urine 1.034 (*)    Hgb urine dipstick LARGE (*)    Bilirubin Urine SMALL  (*)    Ketones, ur >80 (*)    Protein, ur 100 (*)    Bacteria, UA RARE (*)    All other components within normal limits  WET PREP, GENITAL  URINE CULTURE  PREGNANCY, URINE  GC/CHLAMYDIA PROBE AMP ([place]) NOT AT [facility]    EKG None  Radiology No results found.  Procedures Procedures   Medications Ordered in ED Medications - No data to display  ED Course  I have reviewed the triage vital signs and the  nursing notes.  Pertinent labs & imaging results that were available during my care of the patient were reviewed by me and considered in my medical decision making (see chart for details).    MDM Rules/Calculators/A&P                           Verdean Brando is a 25 y.o. female with a past medical history significant for ovarian cyst, anxiety, and dysmenorrhea who presents with vaginal bleeding and discharge.  According to patient and previous team, patient was seen earlier for some upper abdominal symptoms but had a reassuring exam and work-up and was felt stable for discharge home.  She reports that while going to the lobby before leaving, she felt she was having some vaginal bleeding and discharge.  She also felt that there was a bump near her vagina that she wanted evaluated.  She reports that she has not had intercourse before and did not tolerate a pelvic exam with a speculum previously.  She says that she is not having true abdominal discomfort but says that it "feels weird" in her groin.  She denies any constipation or diarrhea and reports she does  have vaginal discharge and vaginal bleeding.  She reports that due to her birth control she has not had a menstrual cycle in 2 years.  On exam externally, lungs are clear and chest is nontender.  Abdomen is nontender.  Flanks and back tender.  Patient has some tenderness in her left TMJ area on her jaw which she reports was given her problem in the past and she is following with her PCP for.  We will do a pelvic exam with  chaperone and get external swabs but due to her reported intolerance of speculum and lack of previous intercourse, will hold on internal exam.  Due to the discharge complaint, will get wet prep and GC/chlamydia swabs from if any discharge we can see.  We will also look externally at any bumps of concern.  Due to the report of some urine changes which she could not further elaborate on, we will get a urinalysis and culture.  As her previous visits felt she was safe from an upper abdominal and chest standpoint, we will not do further work-up at that time for that but we will focus on the vaginal symptoms today.  Although she reports no previous intercourse, will get a pregnancy test and anticipate reassessment and disposition after pelvic exam and swabs.  Wet prep was performed with chaperone externally as patient did not want a speculum exam.  There was some small mount of discharge that appeared physiologic but no bleeding seen.  6:26 PM Patient's wet prep did not show any evidence of BV or other abnormality.  Urinalysis does not show convincing evidence of infection.  Culture was sent of the urine and the GC/chlamydia swabs were also sent.  Given the reassuring work-up we feel she is safe for discharge home and there was no gross bleeding seen on external exam.  Patient was encouraged to follow-up with her PCP and OB/GYN for further management and rest and stay hydrated.  We discussed return precautions and patient agrees.  Patient discharged in good condition after reassuring work-up.    Final Clinical Impression(s) / ED Diagnoses Final diagnoses:  Vaginal bleeding    Rx / DC Orders ED Discharge Orders     None       Clinical Impression: 1. Vaginal bleeding  Disposition: Discharge  Condition: Good  I have discussed the results, Dx and Tx plan with the pt(& family if present). He/she/they expressed understanding and agree(s) with the plan. Discharge instructions discussed at  great length. Strict return precautions discussed and pt &/or family have verbalized understanding of the instructions. No further questions at time of discharge.    New Prescriptions   No medications on file    Follow Up: Medicine, [facility] [place] [id number] [phone]     your OBGYN     [facility] [place] [phone]       Telisa Ohlsen, Gwenyth Allegra, MD 06/18/21 [phone]

## 2021-06-18 NOTE — ED Provider Notes (Signed)
[facility] EMERGENCY DEPT Provider Note   CSN: [id number] Arrival date & time: 06/18/21  1002     History Chief Complaint  Patient presents with   Gastroesophageal Reflux    Carol Schwartz is a 25 y.o. female.  Patient with history of anxiety, reflux presents for recurrent visit for heartburn symptoms.  States that it all started when she ate coconut cake and cheese popcorn 6 days ago.  Patient been seen multiple times.  Patient feels, sensations up into her neck and upper stomach.  No fevers or chills or vomiting.  No abdominal surgery history.  No pain in the right upper quadrant.  No cardiac history no blood clot history.  No blood in the stools or known ulcer history.  Patient has gastroenterology to follow-up with.  Patient is on Prilosec.  Patient has a primary doctor.      Past Medical History:  Diagnosis Date   Anxiety     Patient Active Problem List   Diagnosis Date Noted   Ovarian cyst 11/13/2020   Dysmenorrhea 11/13/2020    Past Surgical History:  Procedure Laterality Date   LAPAROSCOPY  02/24/2012   Procedure: LAPAROSCOPY OPERATIVE;  Surgeon: Geryl Rankins, MD;  Location: [facility];  Service: Gynecology;  Laterality: N/A;  Drainage of Left Ovarian Cyst   OVARIAN CYST REMOVAL  02/24/2012   Procedure: OVARIAN CYSTECTOMY;  Surgeon: Geryl Rankins, MD;  Location: [facility];  Service: Gynecology;  Laterality: Right;     OB History   No obstetric history on file.     Family History  Problem Relation Age of Onset   Diabetes Father    Heart attack Father 59   Hypertension Other     Social History   Tobacco Use   Smoking status: Never   Smokeless tobacco: Never  Vaping Use   Vaping Use: Never used  Substance Use Topics   Alcohol use: No   Drug use: No    Home Medications Prior to Admission medications   Medication Sig Start Date End Date Taking? Authorizing Provider  hydrOXYzine (ATARAX/VISTARIL) 25 MG tablet Take 1 tablet (25 mg total) by  mouth every 8 (eight) hours as needed for up to 30 doses for anxiety. 05/23/21   Terald Sleeper, MD  ibuprofen (ADVIL) 600 MG tablet Take 1,200 mg by mouth 2 (two) times daily as needed. 09/18/20   [provider]  norethindrone (AYGESTIN) 5 MG tablet Take 5 mg by mouth daily. 10/29/20   [provider]  omeprazole (PRILOSEC) 20 MG capsule Take 1 capsule (20 mg total) by mouth daily. 06/16/21   Sabas Sous, MD  sucralfate (CARAFATE) 1 g tablet Take 1 tablet (1 g total) by mouth 4 (four) times daily -  with meals and at bedtime. 06/17/21   Geoffery Lyons, MD    Allergies    Patient has no known allergies.  Review of Systems   Review of Systems  Constitutional:  Negative for chills and fever.  HENT:  Negative for congestion.   Eyes:  Negative for visual disturbance.  Respiratory:  Negative for shortness of breath.   Cardiovascular:  Negative for chest pain.  Gastrointestinal:  Positive for abdominal pain. Negative for vomiting.  Genitourinary:  Negative for dysuria and flank pain.  Musculoskeletal:  Negative for back pain, neck pain and neck stiffness.  Skin:  Negative for rash.  Neurological:  Negative for light-headedness and headaches.   Physical Exam Updated Vital Signs BP 123/74   Pulse 89  Temp 99.5 F (37.5 C) (Oral)   Resp 18   SpO2 100%   Physical Exam Vitals and nursing note reviewed.  Constitutional:      General: She is not in acute distress.    Appearance: She is well-developed.  HENT:     Head: Normocephalic and atraumatic.     Mouth/Throat:     Mouth: Mucous membranes are moist.  Eyes:     General:        Right eye: No discharge.        Left eye: No discharge.     Conjunctiva/sclera: Conjunctivae normal.  Neck:     Trachea: No tracheal deviation.  Cardiovascular:     Rate and Rhythm: Normal rate and regular rhythm.     Heart sounds: No murmur heard. Pulmonary:     Effort: Pulmonary effort is normal.     Breath sounds: Normal  breath sounds.  Abdominal:     General: There is no distension.     Palpations: Abdomen is soft.     Tenderness: There is no abdominal tenderness. There is no guarding.  Musculoskeletal:     Cervical back: Normal range of motion and neck supple. No rigidity.  Skin:    General: Skin is warm.     Capillary Refill: Capillary refill takes less than 2 seconds.     Findings: No rash.  Neurological:     General: No focal deficit present.     Mental Status: She is alert.     Cranial Nerves: No cranial nerve deficit.  Psychiatric:        Mood and Affect: Mood is anxious.    ED Results / Procedures / Treatments   Labs (all labs ordered are listed, but only abnormal results are displayed) Labs Reviewed - No data to display  EKG None  Radiology No results found.  Procedures Procedures   Medications Ordered in ED Medications  famotidine (PEPCID) tablet 20 mg (has no administration in time range)    ED Course  I have reviewed the triage vital signs and the nursing notes.  Pertinent labs & imaging results that were available during my care of the patient were reviewed by me and considered in my medical decision making (see chart for details).    MDM Rules/Calculators/A&P                           Patient presents with clinical concern for acid reflux and anxiety.  Reassuring exam no significant tenderness no guarding.  No active chest pain.  Vital signs normal in the room.  Reviewed medical records on the fifth patient had blood work which was unremarkable lipase normal, liver function normal, electrolytes and hemoglobin normal.  Instructed patient to follow-up outpatient as previously arranged.  Patient feels safe at home and does not discuss any other stressors.  Pepcid as needed until she gets home.  Final Clinical Impression(s) / ED Diagnoses Final diagnoses:  Gastroesophageal reflux disease with esophagitis without hemorrhage    Rx / DC Orders ED Discharge Orders      None        Elnora Morrison, MD 06/18/21 1219

## 2021-06-18 NOTE — Discharge Instructions (Addendum)
Your history, exam, work-up today did not show evidence of acute vaginal infection causing the bleeding and small mount of discharge.  We suspect is physiologic.  Your urine did not show convincing evidence of infection but a culture was sent.  If it is positive, you will be called.  Please follow-up with your PCP and your OB/GYN and please rest and stay hydrated.

## 2021-06-18 NOTE — Telephone Encounter (Signed)
Okay.  Echo and Stress test are unremarkable.  Monitor is still pending.

## 2021-06-18 NOTE — ED Triage Notes (Signed)
Pt went to lobby and checked back in with vaginal bleeding, pressure in lower abd.  Throat does not feel right.  Having left thigh pain and numbness

## 2021-06-18 NOTE — ED Triage Notes (Signed)
Pt arrives to ED with c/o of heart burn. This started when she ate coconut cake and cheese popcorn x6 days ago.

## 2021-06-18 NOTE — ED Notes (Signed)
Patient verbalizes understanding of discharge instructions. Opportunity for questioning and answers were provided. Patient discharged from ED.  °

## 2021-06-18 NOTE — ED Notes (Signed)
Pt reminded that we need a urine sample. Pt provided cup and wipes. Pt still reports she does not need to urinate.

## 2021-06-18 NOTE — ED Triage Notes (Signed)
Pt just went to lobby and checked back in with vaginal bleeding and pressure in her lower abd. Still doesn't feel right in her throat and pop corn in her stomach.

## 2021-06-18 NOTE — ED Notes (Signed)
ED Provider at bedside. 

## 2021-06-19 ENCOUNTER — Emergency Department (HOSPITAL_COMMUNITY)
Admission: EM | Admit: 2021-06-19 | Discharge: 2021-06-19 | Disposition: A | Discharge status: Home / Self Care | Payer: Medicaid Other | Attending: Emergency Medicine | Admitting: Emergency Medicine

## 2021-06-19 ENCOUNTER — Emergency Department (HOSPITAL_BASED_OUTPATIENT_CLINIC_OR_DEPARTMENT_OTHER)
Admission: EM | Admit: 2021-06-19 | Discharge: 2021-06-19 | Disposition: A | Discharge status: Home / Self Care | Payer: Medicaid Other | Source: Home / Self Care | Attending: Emergency Medicine | Admitting: Emergency Medicine

## 2021-06-19 ENCOUNTER — Encounter: Payer: Self-pay | Admitting: Allergy & Immunology

## 2021-06-19 ENCOUNTER — Ambulatory Visit (INDEPENDENT_AMBULATORY_CARE_PROVIDER_SITE_OTHER): Payer: Medicaid Other | Admitting: Allergy & Immunology

## 2021-06-19 ENCOUNTER — Encounter (HOSPITAL_COMMUNITY): Payer: Self-pay | Admitting: Emergency Medicine

## 2021-06-19 ENCOUNTER — Other Ambulatory Visit: Payer: Self-pay

## 2021-06-19 VITALS — BP 154/98 | HR 103 | Temp 98.0°F | Resp 16 | Ht 68.0 in | Wt 269.4 lb

## 2021-06-19 DIAGNOSIS — F419 Anxiety disorder, unspecified: Secondary | ICD-10-CM | POA: Diagnosis not present

## 2021-06-19 DIAGNOSIS — K9049 Malabsorption due to intolerance, not elsewhere classified: Secondary | ICD-10-CM

## 2021-06-19 DIAGNOSIS — R69 Illness, unspecified: Secondary | ICD-10-CM | POA: Diagnosis present

## 2021-06-19 DIAGNOSIS — R6889 Other general symptoms and signs: Secondary | ICD-10-CM

## 2021-06-19 DIAGNOSIS — N939 Abnormal uterine and vaginal bleeding, unspecified: Secondary | ICD-10-CM

## 2021-06-19 DIAGNOSIS — T7840XD Allergy, unspecified, subsequent encounter: Secondary | ICD-10-CM

## 2021-06-19 LAB — GC/CHLAMYDIA PROBE AMP (~~LOC~~) NOT AT ARMC
Chlamydia: NEGATIVE
Comment: NEGATIVE
Comment: NORMAL
Neisseria Gonorrhea: NEGATIVE

## 2021-06-19 LAB — URINE CULTURE

## 2021-06-19 MED ORDER — CETIRIZINE HCL 10 MG PO TABS: 10.0000 mg | ORAL_TABLET | Freq: Two times a day (BID) | ORAL | 5 refills | Status: DC

## 2021-06-19 NOTE — Patient Instructions (Addendum)
1. Allergic reaction - I am not sure what is going on, but it does not sound allergic.  - It certainly is an odd presentation. - Start Zyrtec (cetirizine) 58m twice daily (antihistamine that can calm down reactions). - This should decrease your reactions if you take this every day AND if it is allergy mediated.  - We are going to get some lab work to rule out serious causes of allergic reactions:  - Alpha-Gal Panel - ANA, IFA (with reflex) - Chronic Urticaria - C-reactive protein - CMP14+EGFR - Sedimentation rate - Tryptase - Thyroid antibodies - CBC With Diff/Platelet - Allergen Profile, Basic Food - Corn IgE - Allergen, Strawberry, f44 - Allergen, Bean Black - Allergen, Lettuce, f215 - Allergen, Ginger, Rf270 - Soybean IgE - Allergens w/Comp Rflx Area 2 - We will call you in 1-2 weeks with the results of the testing.   2. Return in 6 Weeks     Please inform [place] any Emergency Department visits, hospitalizations, or changes in symptoms. Call [place] going to the ED for breathing or allergy symptoms since we might be able to fit you in for a sick visit. Feel free to contact [place]anytime with any questions, problems, or concerns.  It was a pleasure to meet you and your mother today!  Websites that have reliable patient information: 1. American Academy of Asthma, Allergy, and Immunology: www.aaaai.org 2. Food Allergy Research and Education (FARE): foodallergy.org 3. Mothers of Asthmatics: http://www.asthmacommunitynetwork.org 4. American College of Allergy, Asthma, and Immunology: www.acaai.org   COVID-19 Vaccine Information can be found at: [email or web address] questions related to vaccine distribution or appointments, please email [email or web address] or call [phone]   We realize that you might be concerned about having an allergic reaction to the COVID19 vaccines. To help with that concern, WE ARE  OFFERING THE COVID19 VACCINES IN OUR OFFICE! Ask the front desk for dates!     "Like" [facility] Facebook and Instagram for our latest updates!      A healthy democracy works best when ANew [employer] Life Insuranceparticipate! Make sure you are registered to vote! If you have moved or changed any of your contact information, you will need to get this updated before voting!  In some cases, you MAY be able to register to vote online: [email or web address]

## 2021-06-19 NOTE — ED Triage Notes (Signed)
Patient here with multiple complaints.  Patient seen three times in the last 24 hours prior to this visit.  Patient states she is having vaginal bleeding, GERD issues, body aches, coughing up food.

## 2021-06-19 NOTE — Discharge Instructions (Signed)
Continue with your antacid medication as currently prescribed.  Recommend follow-up with your doctors office, call today.

## 2021-06-19 NOTE — ED Provider Notes (Signed)
[facility] EMERGENCY DEPARTMENT Provider Note   CSN: [id number] Arrival date & time: 06/19/21  0255     History No chief complaint on file.   Carol Schwartz is a 25 y.o. female.  25 year old female presents with complaint of continuing to feel unwell.  Patient has vague complaints of vaginal bleeding, reflux, body aches and occasionally coughing up food.  This is patient's third visit in the past 24 hours with similar vague complaints.      Past Medical History:  Diagnosis Date   Anxiety     Patient Active Problem List   Diagnosis Date Noted   Ovarian cyst 11/13/2020   Dysmenorrhea 11/13/2020    Past Surgical History:  Procedure Laterality Date   LAPAROSCOPY  02/24/2012   Procedure: LAPAROSCOPY OPERATIVE;  Surgeon: Geryl Rankins, MD;  Location: [facility];  Service: Gynecology;  Laterality: N/A;  Drainage of Left Ovarian Cyst   OVARIAN CYST REMOVAL  02/24/2012   Procedure: OVARIAN CYSTECTOMY;  Surgeon: Geryl Rankins, MD;  Location: [facility];  Service: Gynecology;  Laterality: Right;     OB History   No obstetric history on file.     Family History  Problem Relation Age of Onset   Diabetes Father    Heart attack Father 10   Hypertension Other     Social History   Tobacco Use   Smoking status: Never   Smokeless tobacco: Never  Vaping Use   Vaping Use: Never used  Substance Use Topics   Alcohol use: No   Drug use: No    Home Medications Prior to Admission medications   Medication Sig Start Date End Date Taking? Authorizing Provider  hydrOXYzine (ATARAX/VISTARIL) 25 MG tablet Take 1 tablet (25 mg total) by mouth every 8 (eight) hours as needed for up to 30 doses for anxiety. 05/23/21   Terald Sleeper, MD  ibuprofen (ADVIL) 600 MG tablet Take 1,200 mg by mouth 2 (two) times daily as needed. 09/18/20   [provider]  norethindrone (AYGESTIN) 5 MG tablet Take 5 mg by mouth daily. 10/29/20   [provider]  omeprazole  (PRILOSEC) 20 MG capsule Take 1 capsule (20 mg total) by mouth daily. 06/16/21   Sabas Sous, MD  sucralfate (CARAFATE) 1 g tablet Take 1 tablet (1 g total) by mouth 4 (four) times daily -  with meals and at bedtime. 06/17/21   Geoffery Lyons, MD    Allergies    Patient has no known allergies.  Review of Systems   Review of Systems  Constitutional:  Negative for fever.  Respiratory:  Positive for cough.   Gastrointestinal:  Negative for vomiting.  Genitourinary:  Positive for vaginal bleeding.  Skin:  Negative for wound.  Allergic/Immunologic: Negative for immunocompromised state.  Psychiatric/Behavioral:  Negative for confusion. The patient is nervous/anxious.    Physical Exam Updated Vital Signs BP 137/86 (BP Location: Left Arm)   Pulse 100   Temp 98.7 F (37.1 C) (Oral)   Resp 18   SpO2 100%   Physical Exam Vitals and nursing note reviewed.  Constitutional:      General: She is not in acute distress.    Appearance: She is well-developed. She is not diaphoretic.  HENT:     Head: Normocephalic and atraumatic.     Mouth/Throat:     Mouth: Mucous membranes are moist.  Eyes:     Conjunctiva/sclera: Conjunctivae normal.  Cardiovascular:     Rate and Rhythm: Normal rate and regular rhythm.  Heart sounds: Normal heart sounds.     Comments: Zio patch on left chest Pulmonary:     Effort: Pulmonary effort is normal.     Breath sounds: Normal breath sounds.  Skin:    General: Skin is warm and dry.     Findings: No erythema or rash.  Neurological:     Mental Status: She is alert and oriented to person, place, and time.  Psychiatric:        Behavior: Behavior normal.    ED Results / Procedures / Treatments   Labs (all labs ordered are listed, but only abnormal results are displayed) Labs Reviewed - No data to display  EKG None  Radiology No results found.  Procedures Procedures   Medications Ordered in ED Medications - No data to display  ED Course  I  have reviewed the triage vital signs and the nursing notes.  Pertinent labs & imaging results that were available during my care of the patient were reviewed by me and considered in my medical decision making (see chart for details).  Clinical Course as of 06/19/21 4982  Caleen Essex Jun 19, 2021  1542 25 year old female presents for her third visit in 24 hours with continuation of vague complaints.  Prior charts, labs, imaging, EKG reviewed.  Patient's complaints remain unchanged.  She seems most distressed about her intermittent palpitations, she is wearing a Zio patch, this is the second Zio patch worn recently. Patient's vitals are reassuring, she is nontoxic and in no distress.  Encouraged patient to follow-up with her primary care provider. [LM]    Clinical Course User Index [LM] Alden Hipp   MDM Rules/Calculators/A&P                           Final Clinical Impression(s) / ED Diagnoses Final diagnoses:  Feeling unwell    Rx / DC Orders ED Discharge Orders     None        Jeannie Fend, PA-C 06/19/21 6415    Nira Conn, MD 06/19/21 [phone]

## 2021-06-19 NOTE — Discharge Instructions (Signed)
Follow-up with your primary doctor in the next few days. °

## 2021-06-19 NOTE — ED Provider Notes (Signed)
[facility] EMERGENCY DEPT Provider Note   CSN: [id number] Arrival date & time: 06/18/21  1907     History Chief Complaint  Patient presents with   Leg Pain    Carol Schwartz is a 25 y.o. female.  Patient is a 25 year old female presenting to the emergency department with multiple complaints.  This is her 12th visit to the emergency department in the month of December and today is the ninth.  This is her third visit today.  Patient presenting today with complaints of pain in her leg, pain in her vagina, vaginal bleeding, pain in her buttock, palpitations, GERD, and feeling what she describes as "heartburn throughout her entire body".  The history is provided by the patient.      Past Medical History:  Diagnosis Date   Anxiety     Patient Active Problem List   Diagnosis Date Noted   Ovarian cyst 11/13/2020   Dysmenorrhea 11/13/2020    Past Surgical History:  Procedure Laterality Date   LAPAROSCOPY  02/24/2012   Procedure: LAPAROSCOPY OPERATIVE;  Surgeon: Geryl Rankins, MD;  Location: [facility];  Service: Gynecology;  Laterality: N/A;  Drainage of Left Ovarian Cyst   OVARIAN CYST REMOVAL  02/24/2012   Procedure: OVARIAN CYSTECTOMY;  Surgeon: Geryl Rankins, MD;  Location: [facility];  Service: Gynecology;  Laterality: Right;     OB History   No obstetric history on file.     Family History  Problem Relation Age of Onset   Diabetes Father    Heart attack Father 57   Hypertension Other     Social History   Tobacco Use   Smoking status: Never   Smokeless tobacco: Never  Vaping Use   Vaping Use: Never used  Substance Use Topics   Alcohol use: No   Drug use: No    Home Medications Prior to Admission medications   Medication Sig Start Date End Date Taking? Authorizing Provider  hydrOXYzine (ATARAX/VISTARIL) 25 MG tablet Take 1 tablet (25 mg total) by mouth every 8 (eight) hours as needed for up to 30 doses for anxiety. 05/23/21   Terald Sleeper, MD   ibuprofen (ADVIL) 600 MG tablet Take 1,200 mg by mouth 2 (two) times daily as needed. 09/18/20   [provider]  norethindrone (AYGESTIN) 5 MG tablet Take 5 mg by mouth daily. 10/29/20   [provider]  omeprazole (PRILOSEC) 20 MG capsule Take 1 capsule (20 mg total) by mouth daily. 06/16/21   Sabas Sous, MD  sucralfate (CARAFATE) 1 g tablet Take 1 tablet (1 g total) by mouth 4 (four) times daily -  with meals and at bedtime. 06/17/21   Geoffery Lyons, MD    Allergies    Patient has no known allergies.  Review of Systems   Review of Systems  All other systems reviewed and are negative.  Physical Exam Updated Vital Signs BP (!) 150/91 (BP Location: Right Arm)   Pulse (!) 102   Temp 98.2 F (36.8 C) (Oral)   Resp 14   Ht 5\' 8"  (1.727 m)   Wt 127 kg   SpO2 97%   BMI 42.57 kg/m   Physical Exam Vitals and nursing note reviewed.  Constitutional:      General: She is not in acute distress.    Appearance: She is well-developed. She is not diaphoretic.  HENT:     Head: Normocephalic and atraumatic.  Cardiovascular:     Rate and Rhythm: Normal rate and regular rhythm.  Heart sounds: No murmur heard.   No friction rub. No gallop.  Pulmonary:     Effort: Pulmonary effort is normal. No respiratory distress.     Breath sounds: Normal breath sounds. No wheezing.  Abdominal:     General: Bowel sounds are normal. There is no distension.     Palpations: Abdomen is soft.     Tenderness: There is no abdominal tenderness.  Musculoskeletal:        General: Normal range of motion.     Cervical back: Normal range of motion and neck supple.  Skin:    General: Skin is warm and dry.  Neurological:     General: No focal deficit present.     Mental Status: She is alert and oriented to person, place, and time.    ED Results / Procedures / Treatments   Labs (all labs ordered are listed, but only abnormal results are displayed) Labs Reviewed - No data to  display  EKG None  Radiology No results found.  Procedures Procedures   Medications Ordered in ED Medications - No data to display  ED Course  I have reviewed the triage vital signs and the nursing notes.  Pertinent labs & imaging results that were available during my care of the patient were reviewed by me and considered in my medical decision making (see chart for details).    MDM Rules/Calculators/A&P  Patient returning to the emergency department for the sixth time in 48 hours with multiple complaints.  I find nothing on physical examination that would explain any of these problems.  Patient has had multiple work-ups during the month of December and I see no reason to perform additional testing.  Patient's behavior is somewhat unusual, but denies to me that she is having any hallucinations or suicidal or homicidal ideation.  She denies to me that she is in an unsafe environment at home.  Patient offered to speak with behavioral health, however declines.  At this point, I feel as though discharge is appropriate.  Patient advised to follow-up with her primary doctor regarding her multiple issues.  Final Clinical Impression(s) / ED Diagnoses Final diagnoses:  None    Rx / DC Orders ED Discharge Orders     None        Geoffery Lyons, MD 06/19/21 0221

## 2021-06-19 NOTE — Progress Notes (Signed)
NEW PATIENT  Date of Service/Encounter:  06/19/21  Consult requested by: Medicine, [facility]   Assessment:   Concern for an allergic reaction - getting labs  History of anxiety - needs to be addressed   GERD like symptoms - GI appointment pending   None of her symptoms seem consistent with an allergic reaction, but we are good to get some labs to rule this out.  Plan/Recommendations:   1. Allergic reaction - I am not sure what is going on, but it does not sound allergic.  - It certainly is an odd presentation. - Start Zyrtec (cetirizine) 82m twice daily (antihistamine that can calm down reactions). - This should decrease your reactions if you take this every day AND if it is allergy mediated.  - We are going to get some lab work to rule out serious causes of allergic reactions:  - Alpha-Gal Panel - ANA, IFA (with reflex) - Chronic Urticaria - C-reactive protein - CMP14+EGFR - Sedimentation rate - Tryptase - Thyroid antibodies - CBC With Diff/Platelet - Allergen Profile, Basic Food - Corn IgE - Allergen, Strawberry, f44 - Allergen, Bean Black - Allergen, Lettuce, f215 - Allergen, Ginger, Rf270 - Soybean IgE - Allergens w/Comp Rflx Area 2 - We will call you in 1-2 weeks with the results of the testing.   2. Return in 6 Weeks     This note in its entirety was forwarded to the Provider who requested this consultation.  Subjective:   KCollin Hendleyis a 25y.o. female presenting today for evaluation of  Chief Complaint  Patient presents with   Palpitations   Shortness of Breath   Allergy Testing    Symptoms from mac n cheese, vegan pizza, cheese popcorn     KAyjah Showhas a history of the following: Patient Active Problem List   Diagnosis Date Noted   Ovarian cyst 11/13/2020   Dysmenorrhea 11/13/2020    History obtained from: chart review and patient and grandmother.  KJenkins Rougewas referred by Medicine, [facility].     KJuanellis a 25y.o. female presenting for an evaluation of a multitude of complaints, but mostly focused on possible food allergies .  She has been to the ER no less than 13 times in December alone for these issues.  These include complaints of reflux, chest pain, body aches, palpitations, anxiety, and vaginal bleeding.  Her story is rather all over the place today.  Regardless, every time she goes to the emergency room, her vital signs are normal.  She reports that she has these symptoms with a variety of different foods.  Strawberries in particular resulted in vaginal bleeding.  She does not have a history of regular cycles since she is on birth control.  This is the only to the cause of vaginal bleeding.  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  She had a laparoscopy in September 2013 to address this.  She had both cysts drained.  She does have classic GERD symptoms.  She has on omeprazole for this.  She has a gastroenterology appointment pending later in December.  At every visit, her vital signs are normal.  She has no hives or throat swelling.  It is unclear if she tolerates all the major food allergens, as she said every food gives her this constellation of symptoms.  She has never had food allergies in the past.  She has  no history of asthma or environmental allergies.  Labs drawn over these multiple ED visits show a slightly increased bilirubin level, although not far out of the range.  Her albumin level has been 0.1 below the normal range.  Other than that, her labs have been relatively normal including troponin, lipase, thyroid stimulating hormone, complete blood counts, and the rest of the metabolic panels.  She has had a couple of normal chest x-rays.  She does have a history of anxiety.  She has been on hydroxyzine for this.  Otherwise, there is no history of other atopic diseases, including asthma, drug allergies,  environmental allergies, stinging insect allergies, eczema, urticaria, or contact dermatitis. There is no significant infectious history. Vaccinations are up to date.    Past Medical History: Patient Active Problem List   Diagnosis Date Noted   Ovarian cyst 11/13/2020   Dysmenorrhea 11/13/2020    Medication List:  Allergies as of 06/19/2021   No Known Allergies      Medication List        Accurate as of June 19, 2021 11:59 PM. If you have any questions, ask your nurse or doctor.          cetirizine 10 MG tablet Commonly known as: ZYRTEC Take 1 tablet (10 mg total) by mouth in the morning and at bedtime. Started by: Valentina Shaggy, MD   hydrOXYzine 25 MG tablet Commonly known as: ATARAX Take 1 tablet (25 mg total) by mouth every 8 (eight) hours as needed for up to 30 doses for anxiety.   ibuprofen 600 MG tablet Commonly known as: ADVIL Take 1,200 mg by mouth 2 (two) times daily as needed.   norethindrone 5 MG tablet Commonly known as: AYGESTIN Take 5 mg by mouth daily.   omeprazole 20 MG capsule Commonly known as: PRILOSEC Take 1 capsule (20 mg total) by mouth daily.   sucralfate 1 g tablet Commonly known as: Carafate Take 1 tablet (1 g total) by mouth 4 (four) times daily -  with meals and at bedtime.        Birth History: non-contributory  Developmental History: non-contributory  Past Surgical History: Past Surgical History:  Procedure Laterality Date   LAPAROSCOPY  02/24/2012   Procedure: LAPAROSCOPY OPERATIVE;  Surgeon: Thurnell Lose, MD;  Location: [place] [facility];  Service: Gynecology;  Laterality: N/A;  Drainage of Left Ovarian Cyst   OVARIAN CYST REMOVAL  02/24/2012   Procedure: OVARIAN CYSTECTOMY;  Surgeon: Thurnell Lose, MD;  Location: [facility];  Service: Gynecology;  Laterality: Right;     Family History: Family History  Problem Relation Age of Onset   Allergic rhinitis Mother    Diabetes Father    Heart attack Father 85   Allergic  rhinitis Brother    Hypertension Other      Social History: Kayle lives at home with her grandmother.    Review of Systems  Constitutional:  Positive for malaise/fatigue and weight loss. Negative for chills and fever.  HENT: Negative.  Negative for congestion, ear discharge and ear pain.   Eyes:  Negative for pain, discharge and redness.  Respiratory:  Negative for cough, sputum production, shortness of breath and wheezing.   Cardiovascular: Negative.  Negative for chest pain and palpitations.  Gastrointestinal:  Positive for abdominal pain, diarrhea, nausea and vomiting. Negative for heartburn.  Skin: Negative.  Negative for itching and rash.  Neurological:  Negative for dizziness and headaches.  Endo/Heme/Allergies:  Negative for environmental allergies. Does not bruise/bleed easily.  Objective:   Blood pressure (!) 154/98, pulse (!) 103, temperature 98 F (36.7 C), temperature source Temporal, resp. rate 16, height _0  (1.727 m), weight 269 lb 6.4 oz (122.2 kg), SpO2 97 %. Body mass index is 40.96 kg/m.   Physical Exam:   Physical Exam Vitals reviewed.  Constitutional:      Appearance: She is well-developed. She is obese.     Comments: Talkative. Seems somewhat tired.   HENT:     Head: Normocephalic and atraumatic.     Right Ear: Tympanic membrane, ear canal and external ear normal. No drainage, swelling or tenderness. Tympanic membrane is not injected, scarred, erythematous, retracted or bulging.     Left Ear: Tympanic membrane, ear canal and external ear normal. No drainage, swelling or tenderness. Tympanic membrane is not injected, scarred, erythematous, retracted or bulging.     Nose: No nasal deformity, septal deviation, mucosal edema or rhinorrhea.     Right Turbinates: Enlarged, swollen and pale.     Left Turbinates: Enlarged, swollen and pale.     Right Sinus: No maxillary sinus tenderness or frontal sinus tenderness.     Left Sinus: No maxillary sinus  tenderness or frontal sinus tenderness.     Mouth/Throat:     Mouth: Mucous membranes are not pale and not dry.     Pharynx: Uvula midline.  Eyes:     General:        Right eye: No discharge.        Left eye: No discharge.     Conjunctiva/sclera: Conjunctivae normal.     Right eye: Right conjunctiva is not injected. No chemosis.    Left eye: Left conjunctiva is not injected. No chemosis.    Pupils: Pupils are equal, round, and reactive to light.  Cardiovascular:     Rate and Rhythm: Normal rate and regular rhythm.     Heart sounds: Normal heart sounds.  Pulmonary:     Effort: Pulmonary effort is normal. No tachypnea, accessory muscle usage or respiratory distress.     Breath sounds: Normal breath sounds. No wheezing, rhonchi or rales.  Chest:     Chest wall: No tenderness.  Abdominal:     Tenderness: There is no abdominal tenderness. There is no guarding or rebound.  Lymphadenopathy:     Head:     Right side of head: No submandibular, tonsillar or occipital adenopathy.     Left side of head: No submandibular, tonsillar or occipital adenopathy.     Cervical: No cervical adenopathy.  Skin:    Coloration: Skin is not pale.     Findings: No abrasion, erythema, petechiae or rash. Rash is not papular, urticarial or vesicular.  Neurological:     Mental Status: She is alert.  Psychiatric:        Behavior: Behavior is cooperative.     Diagnostic studies: labs sent instead      Salvatore Marvel, MD [facility]

## 2021-06-20 ENCOUNTER — Other Ambulatory Visit: Payer: Self-pay

## 2021-06-20 ENCOUNTER — Encounter (HOSPITAL_COMMUNITY): Payer: Self-pay | Admitting: Emergency Medicine

## 2021-06-20 ENCOUNTER — Emergency Department (HOSPITAL_COMMUNITY)
Admission: EM | Admit: 2021-06-20 | Discharge: 2021-06-20 | Disposition: A | Discharge status: Home / Self Care | Payer: Medicaid Other | Attending: Emergency Medicine | Admitting: Emergency Medicine

## 2021-06-20 DIAGNOSIS — K219 Gastro-esophageal reflux disease without esophagitis: Secondary | ICD-10-CM | POA: Insufficient documentation

## 2021-06-20 DIAGNOSIS — R079 Chest pain, unspecified: Secondary | ICD-10-CM | POA: Diagnosis present

## 2021-06-20 MED ORDER — ALUM & MAG HYDROXIDE-SIMETH 200-200-20 MG/5ML PO SUSP
15.0000 mL | Freq: Once | ORAL | Status: DC
  Filled 2021-06-20: qty 30

## 2021-06-20 MED ORDER — SUCRALFATE 1 G PO TABS
1.0000 g | ORAL_TABLET | Freq: Once | ORAL | Status: AC
  Administered 2021-06-20: 1 g via ORAL
  Filled 2021-06-20: qty 1

## 2021-06-20 NOTE — ED Triage Notes (Signed)
BIB EMS pt has been seen multiple times in [facility] this week for same. Did not get relief with meds she took at home.  Also endorses anxiety.

## 2021-06-20 NOTE — ED Provider Notes (Signed)
[facility] EMERGENCY DEPARTMENT Provider Note   CSN: [id number] Arrival date & time: 06/20/21  0321     History Chief Complaint  Patient presents with   Gastroesophageal Reflux    Carol Schwartz is a 25 y.o. female.  25 y/o female presenting for evaluation of a burning sensation in her central chest.  She has been told in the past that she has reflux.  Has been using Prilosec for symptoms without relief.  Her reflux causes associated palpitations. Patient told in the past she has a hx of anxiety. Is scheduled to f/u with gastroenterology in 10 days. Seen in the ED for similar complaints x 5 in the past 24-48 hours.  The history is provided by the patient. No language interpreter was used.  Gastroesophageal Reflux      Past Medical History:  Diagnosis Date   Anxiety     Patient Active Problem List   Diagnosis Date Noted   Ovarian cyst 11/13/2020   Dysmenorrhea 11/13/2020    Past Surgical History:  Procedure Laterality Date   LAPAROSCOPY  02/24/2012   Procedure: LAPAROSCOPY OPERATIVE;  Surgeon: Geryl Rankins, MD;  Location: [facility];  Service: Gynecology;  Laterality: N/A;  Drainage of Left Ovarian Cyst   OVARIAN CYST REMOVAL  02/24/2012   Procedure: OVARIAN CYSTECTOMY;  Surgeon: Geryl Rankins, MD;  Location: [facility];  Service: Gynecology;  Laterality: Right;     OB History   No obstetric history on file.     Family History  Problem Relation Age of Onset   Allergic rhinitis Mother    Diabetes Father    Heart attack Father 6   Allergic rhinitis Brother    Hypertension Other     Social History   Tobacco Use   Smoking status: Never    Passive exposure: Never   Smokeless tobacco: Never  Vaping Use   Vaping Use: Never used  Substance Use Topics   Alcohol use: No   Drug use: No    Home Medications Prior to Admission medications   Medication Sig Start Date End Date Taking? Authorizing Provider  cetirizine (ZYRTEC) 10 MG tablet Take 1  tablet (10 mg total) by mouth in the morning and at bedtime. 06/19/21   Alfonse Spruce, MD  hydrOXYzine (ATARAX/VISTARIL) 25 MG tablet Take 1 tablet (25 mg total) by mouth every 8 (eight) hours as needed for up to 30 doses for anxiety. 05/23/21   Terald Sleeper, MD  ibuprofen (ADVIL) 600 MG tablet Take 1,200 mg by mouth 2 (two) times daily as needed. 09/18/20   [provider]  norethindrone (AYGESTIN) 5 MG tablet Take 5 mg by mouth daily. 10/29/20   [provider]  omeprazole (PRILOSEC) 20 MG capsule Take 1 capsule (20 mg total) by mouth daily. 06/16/21   Sabas Sous, MD  sucralfate (CARAFATE) 1 g tablet Take 1 tablet (1 g total) by mouth 4 (four) times daily -  with meals and at bedtime. 06/17/21   Geoffery Lyons, MD    Allergies    Patient has no known allergies.  Review of Systems   Review of Systems Ten systems reviewed and are negative for acute change, except as noted in the HPI.    Physical Exam Updated Vital Signs BP 139/81 (BP Location: Right Arm)   Pulse (!) 103   Temp 98.8 F (37.1 C) (Oral)   Resp 18   SpO2 97%   Physical Exam Vitals and nursing note reviewed.  Constitutional:  General: She is not in acute distress.    Appearance: She is well-developed. She is not diaphoretic.     Comments: Nontoxic appearing  HENT:     Head: Normocephalic and atraumatic.  Eyes:     General: No scleral icterus.    Conjunctiva/sclera: Conjunctivae normal.  Cardiovascular:     Rate and Rhythm: Normal rate and regular rhythm.     Pulses: Normal pulses.  Pulmonary:     Effort: Pulmonary effort is normal. No respiratory distress.     Breath sounds: No stridor. No wheezing.     Comments: Lungs CTAB. Respirations even and unlabored. Musculoskeletal:        General: Normal range of motion.     Cervical back: Normal range of motion.  Skin:    General: Skin is warm and dry.     Coloration: Skin is not pale.     Findings: No erythema or rash.   Neurological:     Mental Status: She is alert and oriented to person, place, and time.  Psychiatric:        Mood and Affect: Mood is anxious.        Behavior: Behavior normal.    ED Results / Procedures / Treatments   Labs (all labs ordered are listed, but only abnormal results are displayed) Labs Reviewed - No data to display  EKG None  Radiology No results found.  Procedures Procedures   Medications Ordered in ED Medications  sucralfate (CARAFATE) tablet 1 g (has no administration in time range)  alum & mag hydroxide-simeth (MAALOX/MYLANTA) 200-200-20 MG/5ML suspension 15 mL (has no administration in time range)    ED Course  I have reviewed the triage vital signs and the nursing notes.  Pertinent labs & imaging results that were available during my care of the patient were reviewed by me and considered in my medical decision making (see chart for details).    MDM Rules/Calculators/A&P                           25 year old female presenting with recurrent complaints of reflux.  She appears mildly anxious, but nontoxic.  Lung sounds clear to auscultation.  Her prior work ups have been reviewed.  Blood work performed x3 in the past week, all of which were normal and reassuring.  Do not feel further emergent work-up is presently indicated.  Patient stable for discharge to follow-up with gastroenterology as scheduled.  Encouraged continuation of Prilosec usage.   Final Clinical Impression(s) / ED Diagnoses Final diagnoses:  Gastroesophageal reflux disease, unspecified whether esophagitis present    Rx / DC Orders ED Discharge Orders     None        Antony Madura, PA-C 06/20/21 0400    Jacalyn Lefevre, MD 06/20/21 0401

## 2021-06-22 ENCOUNTER — Ambulatory Visit: Payer: Medicaid Other | Admitting: Family

## 2021-06-30 ENCOUNTER — Ambulatory Visit: Payer: Medicaid Other | Admitting: Gastroenterology

## 2021-07-01 ENCOUNTER — Encounter: Payer: Self-pay | Admitting: Allergy & Immunology

## 2021-07-16 ENCOUNTER — Telehealth: Payer: Self-pay

## 2021-07-16 DIAGNOSIS — T7840XD Allergy, unspecified, subsequent encounter: Secondary | ICD-10-CM

## 2021-07-16 NOTE — Telephone Encounter (Signed)
Patient came in to get her blood drawn from previous appointment. While in office she asked Boykin Reaper if labs could be ordered as she has questions if she is allergic to them. Dr. Dellis Anes was verbally notified if the new food could be added to her previous labs and gave a verbal okay. Labs have been ordered and Boykin Reaper has been notified to add labs.

## 2021-07-17 NOTE — Telephone Encounter (Signed)
Thank you for doing that!  I got sidetracked with patients.  Malachi Bonds, MD [facility]

## 2021-07-18 LAB — ALLERGEN, WHEAT, F4: Wheat IgE: <0.1 kU/L

## 2021-07-18 LAB — ALLERGEN, WHITE POTATO,F35: Allergen Potato, White IgE: <0.1 kU/L

## 2021-07-18 LAB — ALLERGEN, PEANUT F13: Peanut IgE: <0.1 kU/L

## 2021-07-18 LAB — F235-IGE LENTIL: Allergen Lentil IgE: <0.1 kU/L

## 2021-07-18 LAB — ALLERGEN, MUSHROOM, RF212: Mushroom IgE: <0.1 kU/L

## 2021-07-19 LAB — ALLERGENS W/COMP RFLX AREA 2
Alternaria Alternata IgE: <0.1 kU/L
Aspergillus Fumigatus IgE: <0.1 kU/L
Bermuda Grass IgE: <0.1 kU/L
Cedar, Mountain IgE: 0.45 kU/L — AB
Cladosporium Herbarum IgE: <0.1 kU/L
Cockroach, German IgE: 3.55 kU/L — AB
Common Silver Birch IgE: <0.1 kU/L
Cottonwood IgE: <0.1 kU/L
D Farinae IgE: 1.45 kU/L — AB
D Pteronyssinus IgE: 0.89 kU/L — AB
E001-IgE Cat Dander: <0.1 kU/L
E005-IgE Dog Dander: <0.1 kU/L
Elm, American IgE: <0.1 kU/L
IgE (Immunoglobulin E), Serum: 289 IU/mL (ref 6–495)
Johnson Grass IgE: <0.1 kU/L
Maple/Box Elder IgE: <0.1 kU/L
Mouse Urine IgE: <0.1 kU/L
Oak, White IgE: 0.11 kU/L — AB
Pecan, Hickory IgE: 0.2 kU/L — AB
Penicillium Chrysogen IgE: <0.1 kU/L
Pigweed, Rough IgE: 0.11 kU/L — AB
Ragweed, Short IgE: 0.19 kU/L — AB
Sheep Sorrel IgE Qn: <0.1 kU/L
Timothy Grass IgE: <0.1 kU/L
White Mulberry IgE: <0.1 kU/L

## 2021-07-19 LAB — ALPHA-GAL PANEL
Allergen Lamb IgE: <0.1 kU/L
Beef IgE: <0.1 kU/L
IgE (Immunoglobulin E), Serum: 253 IU/mL (ref 6–495)
O215-IgE Alpha-Gal: <0.1 kU/L
Pork IgE: <0.1 kU/L

## 2021-07-19 LAB — ALLERGEN, BLACK PEPPER,F280: Allergen Black Pepper IgE: <0.1 kU/L

## 2021-07-19 LAB — ALLERGEN, CHICK PEA, RF309, IGE: F309-IgE Chick Pea: <0.1 kU/L

## 2021-07-19 LAB — F332-IGE MINT: Mint IgE: <0.1 kU/L

## 2021-07-22 ENCOUNTER — Other Ambulatory Visit: Payer: Self-pay

## 2021-07-22 ENCOUNTER — Encounter: Payer: Self-pay | Admitting: Cardiology

## 2021-07-22 ENCOUNTER — Ambulatory Visit: Payer: Medicaid Other | Admitting: Cardiology

## 2021-07-22 VITALS — BP 124/80 | HR 91 | Ht 68.0 in | Wt 257.0 lb

## 2021-07-22 DIAGNOSIS — Z712 Person consulting for explanation of examination or test findings: Secondary | ICD-10-CM

## 2021-07-22 DIAGNOSIS — Z6839 Body mass index (BMI) 39.0-39.9, adult: Secondary | ICD-10-CM

## 2021-07-22 DIAGNOSIS — R072 Precordial pain: Secondary | ICD-10-CM

## 2021-07-22 DIAGNOSIS — E6609 Other obesity due to excess calories: Secondary | ICD-10-CM

## 2021-07-22 DIAGNOSIS — R002 Palpitations: Secondary | ICD-10-CM

## 2021-07-22 NOTE — Progress Notes (Signed)
Date:  07/22/2021   ID:  Carol Schwartz, DOB Mar 26, 1996, MRN [id number]  PCP:  Joycelyn Man, FNP  Cardiologist:  Rex Kras, DO, [facility] (established care 06/01/2021.)  Date: 07/22/21 Last Office Visit: 06/01/2021  Chief Complaint  Patient presents with   Chest Pain   Palpitations   Follow-up    HPI  Carol Schwartz is a 26 y.o. African-American female who presents to the office with a chief complaint of "palpitations." Patient's past medical history and cardiovascular risk factors include: obesity, dysmenorrhea, family hx of heart diseaes (Dad had MI in is 47s).    She is referred to the office at the request of Medicine, [facility] for evaluation of palpitations.  Patient has been experiencing palpitations since October 2022 for which she has called EMS several times and also has gone to the ED multiple times please see epic for additional details.  When she was referred to me back in November 2022 the shared decision was to proceed with an extended Holter monitor to evaluate for dysrhythmias.  Apparently patient has had 2 extended Holter monitors.  Of which 1 was reviewed at the time of the office visit.  Overall the underlying rhythm is sinus followed by sinus tachycardia.  Final report is forthcoming.  The event monitor which was not functioning well it appears that she has mailed it to the wrong address and incentive mailing via [employer] she had dropped it off at the [place] office.  Patient states that she tried to find out if there is triggers to her palpitations.  She started watching her diet and noted that oily foods brought on symptoms of heartburn which were associated with palpitations.  Patient has been started on Prilosec which has helped her overall palpitations which are essentially now resolved.  She has not had any episodes of palpitations in the last 2 weeks.  At the last visit she also complained of chest pain echo noted preserved LVEF and GXT was noted to be low risk  study.   FUNCTIONAL STATUS: No structured exercise program or daily routine.  ALLERGIES: No Known Allergies  MEDICATION LIST PRIOR TO VISIT: Current Meds  Medication Sig   hydrOXYzine (ATARAX/VISTARIL) 25 MG tablet Take 1 tablet (25 mg total) by mouth every 8 (eight) hours as needed for up to 30 doses for anxiety.   ibuprofen (ADVIL) 600 MG tablet Take 1,200 mg by mouth 2 (two) times daily as needed.   norethindrone (AYGESTIN) 5 MG tablet Take 5 mg by mouth daily.   omeprazole (PRILOSEC) 20 MG capsule Take 1 capsule (20 mg total) by mouth daily.     PAST MEDICAL HISTORY: Past Medical History:  Diagnosis Date   Anxiety    GERD (gastroesophageal reflux disease)     PAST SURGICAL HISTORY: Past Surgical History:  Procedure Laterality Date   LAPAROSCOPY  02/24/2012   Procedure: LAPAROSCOPY OPERATIVE;  Surgeon: Thurnell Lose, MD;  Location: [facility];  Service: Gynecology;  Laterality: N/A;  Drainage of Left Ovarian Cyst   OVARIAN CYST REMOVAL  02/24/2012   Procedure: OVARIAN CYSTECTOMY;  Surgeon: Thurnell Lose, MD;  Location: [facility];  Service: Gynecology;  Laterality: Right;    FAMILY HISTORY: The patient family history includes Allergic rhinitis in her brother and mother; Diabetes in her father; Heart attack (age of onset: 66) in her father; Hypertension in an other family member.  SOCIAL HISTORY:  The patient  reports that she has never smoked. She has never been exposed to tobacco smoke.  She has never used smokeless tobacco. She reports that she does not drink alcohol and does not use drugs.  REVIEW OF SYSTEMS: Review of Systems  Constitutional: Negative for chills and fever.  HENT:  Negative for hoarse voice and nosebleeds.   Eyes:  Negative for discharge, double vision and pain.  Cardiovascular:  Negative for chest pain, claudication, dyspnea on exertion, leg swelling, near-syncope, orthopnea, palpitations, paroxysmal nocturnal dyspnea and syncope.  Respiratory:  Negative  for hemoptysis and shortness of breath.   Musculoskeletal:  Negative for muscle cramps and myalgias.  Gastrointestinal:  Negative for abdominal pain, constipation, diarrhea, hematemesis, hematochezia, melena, nausea and vomiting.  Neurological:  Negative for dizziness and light-headedness.   PHYSICAL EXAM: Vitals with BMI 07/22/2021 06/20/2021 06/19/2021  Height 5\' 8"  - 5\' 8"   Weight 257 lbs - 269 lbs 6 oz  BMI [id number] - [id number]  Systolic [id number]  Diastolic 80 81 98  Pulse 91 103 103   CONSTITUTIONAL: Well-developed and well-nourished. No acute distress.  SKIN: Skin is warm and dry. No rash noted. No cyanosis. No pallor. No jaundice HEAD: Normocephalic and atraumatic.  EYES: No scleral icterus MOUTH/THROAT: Moist oral membranes.  NECK: No JVD present. No thyromegaly noted. No carotid bruits  LYMPHATIC: No visible cervical adenopathy.  CHEST Normal respiratory effort. No intercostal retractions  LUNGS: Clear to auscultation bilaterally.  No stridor. No wheezes. No rales.  CARDIOVASCULAR: Regular rate and rhythm, positive S1-S2, no murmurs rubs or gallops appreciated. ABDOMINAL: Obese, soft, nontender, nondistended, positive bowel sounds all 4 quadrants. No apparent ascites.  EXTREMITIES: No peripheral edema, warm to touch, +2DP pulses HEMATOLOGIC: No significant bruising NEUROLOGIC: Oriented to person, place, and time. Nonfocal. Normal muscle tone.  PSYCHIATRIC: Normal mood and affect. Normal behavior. Cooperative  CARDIAC DATABASE: EKG: 06/01/2021: NSR, 98bpm, normal axis, without underlying injury pattern.   Echocardiogram: 06/11/2021:  Normal LV systolic function with EF 60%. Left ventricle cavity is normal in size. Normal global wall motion. Normal diastolic filling pattern. Calculated EF 60%. Pseudo tendon at the LV apex ntoed.  Essentially normal echocardiogram.   Stress Testing: Exercise treadmill stress test 06/11/2021: Exercise treadmill stress test performed using Bruce  protocol. Patient reached 7.0 METS, and 96% of age predicted maximum heart rate. Exercise capacity was low. No chest pain reported. Normal heart rate and hemodynamic response. Stress EKG revealed no ischemic changes. Low risk study.  Heart Catheterization: None  LABORATORY DATA: CBC Latest Ref Rng & Units 06/15/2021 06/14/2021 06/13/2021  WBC 4.0 - 10.5 K/uL 8.4 8.1 8.8  Hemoglobin 12.0 - 15.0 g/dL 13.7 14.1 14.7  Hematocrit 36.0 - 46.0 % 42.0 43.4 45.7  Platelets 150 - 400 K/uL 215 218 231    CMP Latest Ref Rng & Units 07/16/2021 06/15/2021 06/14/2021  Glucose 70 - 99 mg/dL 79 81 77  BUN 6 - 20 mg/dL 14 8 6   Creatinine 0.57 - 1.00 mg/dL 0.85 0.78 0.88  Sodium 134 - 144 mmol/L 141 139 138  Potassium 3.5 - 5.2 mmol/L 4.4 3.7 3.7  Chloride 96 - 106 mmol/L 108(H) 104 104  CO2 20 - 29 mmol/L 20 21(L) 20(L)  Calcium 8.7 - 10.2 mg/dL 9.1 9.8 9.0  Total Protein 6.0 - 8.5 g/dL 6.8 7.7 6.9  Total Bilirubin 0.0 - 1.2 mg/dL 0.5 0.8 1.3(H)  Alkaline Phos 44 - 121 IU/L 75 53 58  AST 0 - 40 IU/L 44(H) 22 31  ALT 0 - 32 IU/L 46(H) 25 28    Lipid Panel  No  results found for: CHOL, TRIG, HDL, CHOLHDL, VLDL, LDLCALC, LDLDIRECT, LABVLDL  No components found for: NTPROBNP No results for input(s): PROBNP in the last 8760 hours. Recent Labs    06/14/21 1828  TSH 1.075    BMP Recent Labs    06/13/21 0249 06/14/21 1828 06/15/21 1925 07/16/21 1419  NA 138 138 139 141  K 4.0 3.7 3.7 4.4  CL 105 104 104 108*  CO2 25 20* 21* 20  GLUCOSE 88 77 81 79  BUN 7 6 8 14   CREATININE 0.92 0.88 0.78 0.85  CALCIUM 9.0 9.0 9.8 9.1  GFRNONAA >60 >60 >60  --     HEMOGLOBIN A1C No results found for: HGBA1C, MPG  External Labs: Collected: 05/06/2021 Hemoglobin 14.3 g/dL, hematocrit 42.5% Sodium 139, potassium 4.4, chloride 106, bicarb 24, BUN 6, creatinine 0.79 AST 17, ALT 16, alkaline phosphatase 67 TSH 1.48  IMPRESSION:    ICD-10-CM   1. Palpitations  R00.2     2. Precordial pain  R07.2      3. Class 2 obesity due to excess calories without serious comorbidity with body mass index (BMI) of 39.0 to 39.9 in adult  E66.09    Z68.39     4. Encounter to discuss test results  Z71.2        RECOMMENDATIONS: Carol Schwartz is a 26 y.o. African-American female whose past medical history and cardiac risk factors include: obesity, dysmenorrhea, family hx of heart diseaes (Dad had MI in is 78s).    Palpitations EKG nonischemic. TSH within normal limits. Hemoglobin within acceptable range. Echo: LVEF preserved, normal diastolic function. GXT: Low risk study. Since initiation of omeprazole her symptoms of palpitations, chest pain, and shortness of breath have resolved completely according to the patient. 14-day extended Holter monitor results reviewed with the patient.  Final report forthcoming.  Precordial pain Resolved Predominately noncardiac. GXT: Low risk study  Echocardiogram: Preserved LVEF, normal diastolic function.    Shortness of breath D-dimer is within normal limits. High sensitive troponins within normal limits. Chest x-ray unremarkable EKG nonischemic Echo and stress test results noted above and discussed with the patient. No additional work-up recommended at this time.  Class 2 severe obesity due to excess calories without serious comorbidity with body mass index (BMI) of 39.0 - 39.9 in adult [facility]) Body mass index is 39.08 kg/m. Lost 21 pounds since I last saw her November 2022. She is congratulated on her efforts. Mostly due to lifestyle changes given her heartburn.  Her symptoms of palpitations were usually brought on by oily foods and therefore she has been avoiding them. I reviewed with the patient the importance of diet, regular physical activity/exercise, weight loss.   Patient is educated on increasing physical activity gradually as tolerated.  With the goal of moderate intensity exercise for 30 minutes a day 5 days a week.  FINAL MEDICATION LIST END OF  ENCOUNTER: No orders of the defined types were placed in this encounter.    There are no discontinued medications.    Current Outpatient Medications:    hydrOXYzine (ATARAX/VISTARIL) 25 MG tablet, Take 1 tablet (25 mg total) by mouth every 8 (eight) hours as needed for up to 30 doses for anxiety., Disp: 30 tablet, Rfl: 0   ibuprofen (ADVIL) 600 MG tablet, Take 1,200 mg by mouth 2 (two) times daily as needed., Disp: , Rfl:    norethindrone (AYGESTIN) 5 MG tablet, Take 5 mg by mouth daily., Disp: , Rfl:    omeprazole (PRILOSEC) 20 MG  capsule, Take 1 capsule (20 mg total) by mouth daily., Disp: 30 capsule, Rfl: 1   cetirizine (ZYRTEC) 10 MG tablet, Take 1 tablet (10 mg total) by mouth in the morning and at bedtime. (Patient not taking: Reported on 07/22/2021), Disp: 60 tablet, Rfl: 5   sucralfate (CARAFATE) 1 g tablet, Take 1 tablet (1 g total) by mouth 4 (four) times daily -  with meals and at bedtime. (Patient not taking: Reported on 07/22/2021), Disp: 60 tablet, Rfl: 0  No orders of the defined types were placed in this encounter.   There are no Patient Instructions on file for this visit.   --Continue cardiac medications as reconciled in final medication list. --Return in about 6 months (around 01/19/2022) for Reevaluation of, Palpitations. Or sooner if needed. --Continue follow-up with your primary care physician regarding the management of your other chronic comorbid conditions.  Patient's questions and concerns were addressed to her satisfaction. She voices understanding of the instructions provided during this encounter.   This note was created using a voice recognition software as a result there may be grammatical errors inadvertently enclosed that do not reflect the nature of this encounter. Every attempt is made to correct such errors.  Rex Kras, [place], [facility]  Pager: [phone] Office: [phone]

## 2021-07-24 NOTE — Telephone Encounter (Signed)
Patient called stating she thinks she is exposed to mold/mildew in her apartment. She is requesting mold lab work. Patient will get labs done today in [place].

## 2021-07-24 NOTE — Telephone Encounter (Signed)
Added Extended Mold Panel.   Carol Marvel, MD [facility]

## 2021-07-24 NOTE — Addendum Note (Signed)
Addended by: Alfonse Spruce on: 07/24/2021 10:29 AM   Modules accepted: Orders

## 2021-07-29 ENCOUNTER — Telehealth: Payer: Self-pay

## 2021-07-29 LAB — ALLERGEN PROFILE, BASIC FOOD
Allergen Corn, IgE: <0.1 kU/L
Beef IgE: <0.1 kU/L
Chocolate/Cacao IgE: <0.1 kU/L
Egg, Whole IgE: <0.1 kU/L
Food Mix (Seafoods) IgE: POSITIVE — AB
Milk IgE: <0.1 kU/L
Peanut IgE: <0.1 kU/L
Pork IgE: <0.1 kU/L
Soybean IgE: <0.1 kU/L
Wheat IgE: <0.1 kU/L

## 2021-07-29 LAB — CMP14+EGFR
ALT: 46 IU/L — ABNORMAL HIGH (ref 0–32)
AST: 44 IU/L — ABNORMAL HIGH (ref 0–40)
Albumin/Globulin Ratio: 1.4 (ref 1.2–2.2)
Albumin: 4 g/dL (ref 3.9–5.0)
Alkaline Phosphatase: 75 IU/L (ref 44–121)
BUN/Creatinine Ratio: 16 (ref 9–23)
BUN: 14 mg/dL (ref 6–20)
Bilirubin Total: 0.5 mg/dL (ref 0.0–1.2)
CO2: 20 mmol/L (ref 20–29)
Calcium: 9.1 mg/dL (ref 8.7–10.2)
Chloride: 108 mmol/L — ABNORMAL HIGH (ref 96–106)
Creatinine, Ser: 0.85 mg/dL (ref 0.57–1.00)
Globulin, Total: 2.8 g/dL (ref 1.5–4.5)
Glucose: 79 mg/dL (ref 70–99)
Potassium: 4.4 mmol/L (ref 3.5–5.2)
Sodium: 141 mmol/L (ref 134–144)
Total Protein: 6.8 g/dL (ref 6.0–8.5)
eGFR: 99 mL/min/{1.73_m2} (ref >59)

## 2021-07-29 LAB — TRYPTASE: Tryptase: 6.8 ug/L (ref 2.2–13.2)

## 2021-07-29 LAB — ALLERGEN, BEAN BLACK
Black Bean*, IgE: <0.35 kU/L (ref <0.35)
Class Interpretation: 0

## 2021-07-29 LAB — C-REACTIVE PROTEIN: CRP: 2 mg/L (ref 0–10)

## 2021-07-29 LAB — CHRONIC URTICARIA: cu index: <3.1 (ref <10)

## 2021-07-29 LAB — ALLERGEN, GINGER, RF270: Allergen Ginger IgE: <0.1 kU/L

## 2021-07-29 LAB — ALLERGEN, STRAWBERRY, F44: Allergen Strawberry IgE: <0.1 kU/L

## 2021-07-29 LAB — ANTINUCLEAR ANTIBODIES, IFA: ANA Titer 1: NEGATIVE

## 2021-07-29 LAB — ALLERGEN, LETTUCE, F215: Allergen Lettuce IgE: <0.1 kU/L

## 2021-07-29 LAB — SEDIMENTATION RATE: Sed Rate: 32 mm/hr (ref 0–32)

## 2021-07-29 NOTE — Telephone Encounter (Signed)
Patient called stating she is allergic to dust mite. She is wondering how does she know if she is reacting to dust mite?  Please Advise

## 2021-07-30 NOTE — Telephone Encounter (Signed)
Dust mites typically cause allergic rhinitis symptoms (sneezing, runny nose, congestion, itchy watery eyes). They can also lead to pruritus and sometimes urticaria.  Carol Bonds, MD [facility]

## 2021-07-30 NOTE — Telephone Encounter (Signed)
Called and explained message to patient, patient verbalized understanding.

## 2021-07-31 LAB — ALLERGEN PROFILE, MOLD
Alternaria Alternata IgE: <0.1 kU/L
Aspergillus Fumigatus IgE: <0.1 kU/L
Aureobasidi Pullulans IgE: <0.1 kU/L
Candida Albicans IgE: <0.1 kU/L
Cladosporium Herbarum IgE: <0.1 kU/L
M009-IgE Fusarium proliferatum: <0.1 kU/L
M014-IgE Epicoccum purpur: <0.1 kU/L
Mucor Racemosus IgE: <0.1 kU/L
Penicillium Chrysogen IgE: <0.1 kU/L
Phoma Betae IgE: 0.17 kU/L — AB
Setomelanomma Rostrat: <0.1 kU/L
Stemphylium Herbarum IgE: <0.1 kU/L

## 2021-08-04 ENCOUNTER — Ambulatory Visit (INDEPENDENT_AMBULATORY_CARE_PROVIDER_SITE_OTHER): Payer: Medicaid Other | Admitting: Allergy & Immunology

## 2021-08-04 ENCOUNTER — Encounter: Payer: Self-pay | Admitting: Allergy & Immunology

## 2021-08-04 ENCOUNTER — Other Ambulatory Visit: Payer: Self-pay

## 2021-08-04 VITALS — BP 124/86 | HR 84 | Temp 97.3°F | Resp 16

## 2021-08-04 DIAGNOSIS — K219 Gastro-esophageal reflux disease without esophagitis: Secondary | ICD-10-CM

## 2021-08-04 DIAGNOSIS — R0602 Shortness of breath: Secondary | ICD-10-CM

## 2021-08-04 DIAGNOSIS — J302 Other seasonal allergic rhinitis: Secondary | ICD-10-CM | POA: Diagnosis not present

## 2021-08-04 MED ORDER — OMEPRAZOLE 20 MG PO CPDR: 20.0000 mg | DELAYED_RELEASE_CAPSULE | Freq: Every day | ORAL | 5 refills | Status: AC

## 2021-08-04 MED ORDER — LEVOCETIRIZINE DIHYDROCHLORIDE 5 MG PO TABS
5.0000 mg | ORAL_TABLET | Freq: Every evening | ORAL | 5 refills | Status: DC
Start: 2021-08-04 — End: 2021-12-10

## 2021-08-04 NOTE — Patient Instructions (Addendum)
1. SOB (shortness of breath) - Lung testing looked good today. - I do not think that we need to add any breathing medications.  - I think the sleep study will be enlightening.  2. Gastroesophageal reflux disease - Continue with omeprazole twice daily as you are doing. - I think you are on the right track.  3. Seasonal and perennial allergic rhinitis with chronic hives/itching - Testing was positive to dust mite, cockroach, trees, weeds, ragweed, and one indoor mold. - Avoidance measures provided. - We are starting the Xyzal for your hives/itching, which should also help with your allergy symptoms as well.  4. 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  - We can get some additional testing to clarify that, but since you are vegan it likely would not matter anyway. - Let us know if you want to pursue this further.   5. Return in about 3 months (around 11/02/2021).    Please inform us of any Emergency Department visits, hospitalizations, or changes in symptoms. Call us before going to the ED for breathing or allergy symptoms since we might be able to fit you in for a sick visit. Feel free to contact us anytime with any questions, problems, or concerns.  It was a pleasure to see you again today!  Websites that have reliable patient information: 1. American Academy of Asthma, Allergy, and Immunology: www.aaaai.org 2. Food Allergy Research and Education (FARE): foodallergy.org 3. Mothers of Asthmatics: http://www.asthmacommunitynetwork.org 4. American College of Allergy, Asthma, and Immunology: www.acaai.org   COVID-19 Vaccine Information can be found at: [email or web address] For questions related to vaccine distribution or appointments, please email [email or web address] or call [phone].   We realize that you might be concerned about having an allergic reaction to the COVID19 vaccines. To help with that concern,  WE ARE OFFERING THE COVID19 VACCINES IN OUR OFFICE! Ask the front desk for dates!     Like [place] on [employer] and Instagram for our latest updates!      A healthy democracy works best when [employer] participate! Make sure you are registered to vote! If you have moved or changed any of your contact information, you will need to get this updated before voting!  In some cases, you MAY be able to register to vote online: [email or web address]      Reducing Pollen Exposure  The American Academy of Allergy, Asthma and Immunology suggests the following steps to reduce your exposure to pollen during allergy seasons.    Do not hang sheets or clothing out to dry; pollen may collect on these items. Do not mow lawns or spend time around freshly cut grass; mowing stirs up pollen. Keep windows closed at night.  Keep car windows closed while driving. Minimize morning activities outdoors, a time when pollen counts are usually at their highest. Stay indoors as much as possible when pollen counts or humidity is high and on windy days when pollen tends to remain in the air longer. Use air conditioning when possible.  Many air conditioners have filters that trap the pollen spores. Use a HEPA room air filter to remove pollen form the indoor air you breathe.  Control of Dust Mite Allergen    Dust mites play a major role in allergic asthma and rhinitis.  They occur in environments with high humidity wherever human skin is found.  Dust mites absorb humidity from the atmosphere (ie, they do not drink) and feed on organic matter (including shed human and animal skin).  Dust mites are  a microscopic type of insect that you cannot see with the naked eye.  High levels of dust mites have been detected from mattresses, pillows, carpets, upholstered furniture, bed covers, clothes, soft toys and any woven material.  The principal allergen of the dust mite is found in its feces.  A gram of  dust may contain 1,000 mites and 250,000 fecal particles.  Mite antigen is easily measured in the air during house cleaning activities.  Dust mites do not bite and do not cause harm to humans, other than by triggering allergies/asthma.    Ways to decrease your exposure to dust mites in your home:  Encase mattresses, box springs and pillows with a mite-impermeable barrier or cover   Wash sheets, blankets and drapes weekly in hot water (130 F) with detergent and dry them in a dryer on the hot setting.  Have the room cleaned frequently with a vacuum cleaner and a damp dust-mop.  For carpeting or rugs, vacuuming with a vacuum cleaner equipped with a high-efficiency particulate air (HEPA) filter.  The dust mite allergic individual should not be in a room which is being cleaned and should wait 1 hour after cleaning before going into the room. Do not sleep on upholstered furniture (eg, couches).   If possible removing carpeting, upholstered furniture and drapery from the home is ideal.  Horizontal blinds should be eliminated in the rooms where the person spends the most time (bedroom, study, television room).  Washable vinyl, roller-type shades are optimal. Remove all non-washable stuffed toys from the bedroom.  Wash stuffed toys weekly like sheets and blankets above.   Reduce indoor humidity to less than 50%.  Inexpensive humidity monitors can be purchased at most hardware stores.  Do not use a humidifier as can make the problem worse and are not recommended.  Control of Cockroach Allergen  Cockroach allergen has been identified as an important cause of acute attacks of asthma, especially in urban settings.  There are fifty-five species of cockroach that exist in the [place], however only three, the [place], [place] species produce allergen that can affect patients with Asthma.  Allergens can be obtained from fecal particles, egg casings and secretions from cockroaches.    Remove food  sources. Reduce access to water. Seal access and entry points. Spray runways with 0.5-1% Diazinon or Chlorpyrifos Blow boric acid power under stoves and refrigerator. Place bait stations (hydramethylnon) at feeding sites.  Control of Mold Allergen   Mold and fungi can grow on a variety of surfaces provided certain temperature and moisture conditions exist.  Outdoor molds grow on plants, decaying vegetation and soil.  The major outdoor mold, Alternaria and Cladosporium, are found in very high numbers during hot and dry conditions.  Generally, a late Summer - Fall peak is seen for common outdoor fungal spores.  Rain will temporarily lower outdoor mold spore count, but counts rise rapidly when the rainy period ends.  The most important indoor molds are Aspergillus and Penicillium.  Dark, humid and poorly ventilated basements are ideal sites for mold growth.  The next most common sites of mold growth are the bathroom and the kitchen.   Indoor (Perennial) Mold Control   Positive indoor molds via skin testing: [place]  Maintain humidity below 50%. Clean washable surfaces with 5% bleach solution. Remove sources e.g. contaminated carpets.

## 2021-08-04 NOTE — Progress Notes (Signed)
FOLLOW UP  Date of Service/Encounter:  08/04/21   Assessment:   Concern for an allergic reaction  Perennial and seasonal allergic rhinitis (dust mite, cockroach, trees, weeds, ragweed, and one indoor mold)  Shortness of breath - sleep study pending   History of anxiety   GERD like symptoms - s/p endoscopy  Plan/Recommendations:   1. SOB (shortness of breath) - Lung testing looked good today. - I do not think that we need to add any breathing medications.  - I think the sleep study will be enlightening.  2. Gastroesophageal reflux disease - Continue with omeprazole twice daily as you are doing. - I think you are on the right track.  3. Seasonal and perennial allergic rhinitis with chronic hives/itching - Testing was positive to dust mite, cockroach, trees, weeds, ragweed, and one indoor mold. - Avoidance measures provided. - We are starting the Xyzal for your hives/itching, which should also help with your allergy symptoms as well.  4. 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  - We can get some additional testing to clarify that, but since you are vegan it likely would not matter anyway. - Let us know if you want to pursue this further.   5. Return in about 3 months (around 11/02/2021).   Subjective:   Carol Schwartz is a 26 y.o. female presenting today for follow up of  Chief Complaint  Patient presents with   Follow-up    Carol Schwartz has a history of the following: Patient Active Problem List   Diagnosis Date Noted   Ovarian cyst 11/13/2020   Dysmenorrhea 11/13/2020    History obtained from: chart review and patient.  Carol Schwartz is a 26 y.o. female presenting for a follow up visit.  She was last seen in December 2022.  At that time, we evaluated her because of concern for allergic reactions.  She has had ER visits nearly every 1 to 2 days.  She was experiencing symptoms which I felt were more consistent with GERD.  Since last visit, she has  mostly done well. She was referred to GI and she was placed with omeprazole to help with the acid production. She had an endoscopy done by Dr. Christoper Allegra on December 21st. Esophagus looked good and there were no ulcers. But it appeared like there was some GERD present.   Omeprazole is helping a lot and her reactions have mostly stopped. She has noticed that some foods triggers her symptoms. Anything with oils and fats. This was the main trigger. She was already on a plant based diet but this has improved since getting rid of the oils in her diet.  She did end up seeing a Pulmonologist for daytime sleepiness. They are going to do a sleep study tomorrow. Hopefully this will help her to figure out what is going on regarding SOB when she is sleeping at night.  Asthma/Respiratory Symptom History: She does have some SOB which is worse at night. She has never had any issues with regards to wheezing or coughing. She has never been diagnosed with asthma.   Allergic Rhinitis Symptom History: She denies any history of coughing or postnasal drip. She has not been needing antibiotics at all.   Skin Symptom History: She has some itchiness that comes and goes. She does not have any pictures of this. She never treats wit with anything in particular. She says that these occur on her chest as well as her bilateral arms.   Otherwise, there have been no  changes to her past medical history, surgical history, family history, or social history.    Review of Systems  Constitutional:  Positive for malaise/fatigue. Negative for chills, fever and weight loss.  HENT: Negative.  Negative for congestion, ear discharge and ear pain.   Eyes:  Negative for pain, discharge and redness.  Respiratory:  Positive for cough. Negative for sputum production, shortness of breath and wheezing.   Cardiovascular: Negative.  Negative for chest pain and palpitations.  Gastrointestinal:  Negative for abdominal pain, constipation, diarrhea,  heartburn, nausea and vomiting.  Skin: Negative.  Negative for itching and rash.  Neurological:  Negative for dizziness and headaches.  Endo/Heme/Allergies:  Negative for environmental allergies. Does not bruise/bleed easily.      Objective:   Blood pressure 124/86, pulse 84, temperature (!) 97.3 F (36.3 C), temperature source Temporal, resp. rate 16, SpO2 99 %. There is no height or weight on file to calculate BMI.   Physical Exam:  Physical Exam Vitals reviewed.  Constitutional:      Appearance: She is well-developed.     Comments: Seems much less anxious today.   HENT:     Head: Normocephalic and atraumatic.     Right Ear: Tympanic membrane, ear canal and external ear normal.     Left Ear: Tympanic membrane, ear canal and external ear normal.     Nose: No nasal deformity, septal deviation, mucosal edema or rhinorrhea.     Right Turbinates: Enlarged and swollen.     Left Turbinates: Enlarged and swollen.     Right Sinus: No maxillary sinus tenderness or frontal sinus tenderness.     Left Sinus: No maxillary sinus tenderness or frontal sinus tenderness.     Mouth/Throat:     Mouth: Mucous membranes are not pale and not dry.     Pharynx: Uvula midline.  Eyes:     General: Lids are normal. No allergic shiner.       Right eye: No discharge.        Left eye: No discharge.     Conjunctiva/sclera: Conjunctivae normal.     Right eye: Right conjunctiva is not injected. No chemosis.    Left eye: Left conjunctiva is not injected. No chemosis.    Pupils: Pupils are equal, round, and reactive to light.  Cardiovascular:     Rate and Rhythm: Normal rate and regular rhythm.     Heart sounds: Normal heart sounds.  Pulmonary:     Effort: Pulmonary effort is normal. No tachypnea, accessory muscle usage or respiratory distress.     Breath sounds: Normal breath sounds. No wheezing, rhonchi or rales.     Comments: Moving air well in all lung fields. No increased work of breathing  noted. Chest:     Chest wall: No tenderness.  Lymphadenopathy:     Cervical: No cervical adenopathy.  Skin:    General: Skin is warm.     Capillary Refill: Capillary refill takes less than 2 seconds.     Coloration: Skin is not jaundiced or pale.     Findings: No abrasion, erythema, petechiae or rash. Rash is not papular, urticarial or vesicular.  Neurological:     Mental Status: She is alert.  Psychiatric:        Behavior: Behavior is cooperative.     Diagnostic studies:    Spirometry: results normal (FEV1: 2.52/78%, FVC: 3.29/88%, FEV1/FVC: 77%).    Spirometry consistent with normal pattern.   Allergy Studies: none  Salvatore Marvel, MD  [facility]

## 2021-08-17 ENCOUNTER — Institutional Professional Consult (permissible substitution): Payer: Medicaid Other | Admitting: Pulmonary Disease

## 2021-08-29 ENCOUNTER — Encounter (HOSPITAL_COMMUNITY): Payer: Self-pay | Admitting: Emergency Medicine

## 2021-08-29 ENCOUNTER — Other Ambulatory Visit: Payer: Self-pay

## 2021-08-29 ENCOUNTER — Emergency Department (HOSPITAL_COMMUNITY)
Admission: EM | Admit: 2021-08-29 | Discharge: 2021-08-29 | Disposition: A | Discharge status: Home / Self Care | Payer: Medicaid Other | Attending: Emergency Medicine | Admitting: Emergency Medicine

## 2021-08-29 DIAGNOSIS — R519 Headache, unspecified: Secondary | ICD-10-CM | POA: Diagnosis not present

## 2021-08-29 DIAGNOSIS — R439 Unspecified disturbances of smell and taste: Secondary | ICD-10-CM | POA: Diagnosis not present

## 2021-08-29 DIAGNOSIS — R5381 Other malaise: Secondary | ICD-10-CM | POA: Diagnosis not present

## 2021-08-29 DIAGNOSIS — Z20822 Contact with and (suspected) exposure to covid-19: Secondary | ICD-10-CM | POA: Diagnosis not present

## 2021-08-29 DIAGNOSIS — R1084 Generalized abdominal pain: Secondary | ICD-10-CM | POA: Insufficient documentation

## 2021-08-29 LAB — RESP PANEL BY RT-PCR (FLU A&B, COVID) ARPGX2
Influenza A by PCR: NEGATIVE
Influenza B by PCR: NEGATIVE
SARS Coronavirus 2 by RT PCR: NEGATIVE

## 2021-08-29 LAB — URINALYSIS, ROUTINE W REFLEX MICROSCOPIC
Bilirubin Urine: NEGATIVE
Glucose, UA: NEGATIVE mg/dL
Hgb urine dipstick: NEGATIVE
Ketones, ur: 5 mg/dL — AB
Leukocytes,Ua: NEGATIVE
Nitrite: NEGATIVE
Protein, ur: NEGATIVE mg/dL
Specific Gravity, Urine: 1.003 — ABNORMAL LOW (ref 1.005–1.030)
pH: 8 (ref 5.0–8.0)

## 2021-08-29 LAB — PREGNANCY, URINE: Preg Test, Ur: NEGATIVE

## 2021-08-29 NOTE — ED Triage Notes (Signed)
Pt arrives via EMS from home with reports of waking up with headache and stomach ache/gas feeling. States she had a weird taste in her mouth but wiped her toothbrush with a disinfectant wipe.

## 2021-08-29 NOTE — ED Notes (Signed)
Tolerated po fluids well.   

## 2021-08-29 NOTE — Discharge Instructions (Signed)
Please stay hydrated by drinking plenty of fluids.  Continue your home medications.  Please follow closely with your primary care doctor.

## 2021-08-29 NOTE — ED Provider Notes (Signed)
Emergency Department Provider Note   I have reviewed the triage vital signs and the nursing notes.   HISTORY  Chief Complaint Headache   HPI Carol Schwartz is a 26 y.o. female with a past medical history reviewed below presents to the emergency department for evaluation of generalized malaise, abdominal discomfort, headache, and a funny taste in the mouth.  Patient tells me yesterday she dropped her toothbrush on the floor and wanted to clean it prior to using it again and so wiped it with some disinfectant wipe.  She noted a funny taste in her mouth which lingered through most of the day and then still seem to be present even 24 hours later.  She noticed some abdominal discomfort along with mild diffuse headache.    Past Medical History:  Diagnosis Date   Anxiety    GERD (gastroesophageal reflux disease)     Review of Systems  Constitutional: No fever/chills Eyes: No visual changes. ENT: No sore throat. Bad taste in mouth.  Cardiovascular: Denies chest pain. Respiratory: Denies shortness of breath. Gastrointestinal: Positive abdominal pain.  No nausea, no vomiting.  No diarrhea.  No constipation. Genitourinary: Negative for dysuria. Musculoskeletal: Negative for back pain. Skin: Negative for rash. Neurological: Negative for focal weakness or numbness. Positive HA.    ____________________________________________   PHYSICAL EXAM:  VITAL SIGNS: ED Triage Vitals  Enc Vitals Group     BP 08/29/21 1620 126/74     Pulse Rate 08/29/21 1620 98     Resp 08/29/21 1620 16     Temp 08/29/21 1620 98.6 F (37 C)     Temp Source 08/29/21 1620 Oral     SpO2 08/29/21 1620 100 %     Weight 08/29/21 1621 255 lb (115.7 kg)     Height 08/29/21 1621 5\' 8"  (1.727 m)   Constitutional: Alert and oriented. Well appearing and in no acute distress. Eyes: Conjunctivae are normal.  Head: Atraumatic. Nose: No congestion/rhinnorhea. Mouth/Throat: Mucous membranes are moist.   Neck: No  stridor.   Cardiovascular: Normal rate, regular rhythm. Good peripheral circulation. Grossly normal heart sounds.   Respiratory: Normal respiratory effort.  No retractions. Lungs CTAB. Gastrointestinal: Soft and nontender. No distention.  Musculoskeletal: No lower extremity tenderness nor edema. No gross deformities of extremities. Neurologic:  Normal speech and language. No gross focal neurologic deficits are appreciated.  Skin:  Skin is warm, dry and intact. No rash noted.  ____________________________________________   LABS (all labs ordered are listed, but only abnormal results are displayed)  Labs Reviewed  URINALYSIS, ROUTINE W REFLEX MICROSCOPIC - Abnormal; Notable for the following components:      Result Value   Color, Urine STRAW (*)    Specific Gravity, Urine 1.003 (*)    Ketones, ur 5 (*)    All other components within normal limits  RESP PANEL BY RT-PCR (FLU A&B, COVID) ARPGX2  PREGNANCY, URINE    ____________________________________________   PROCEDURES  Procedure(s) performed:   Procedures  None  ____________________________________________   INITIAL IMPRESSION / ASSESSMENT AND PLAN / ED COURSE  Pertinent labs & imaging results that were available during my care of the patient were reviewed by me and considered in my medical decision making (see chart for details).   This patient is Presenting for Evaluation of abdominal pain, which does require a range of treatment options, and is a complaint that involves a high risk of morbidity and mortality.  The Differential Diagnoses includes but is not exclusive to acute cholecystitis, intrathoracic  causes for epigastric abdominal pain, gastritis, duodenitis, pancreatitis, small bowel or large bowel obstruction, abdominal aortic aneurysm, hernia, gastritis, etc.  I decided to review pertinent External Data, and in summary last ED visit in 06/2021 with GERD.   Clinical Laboratory Tests Ordered, included COVID/Flu  PCR negative. Preg negative. No UTI.    Social Determinants of Health Risk denies tobacco use.   Medical Decision Making: Summary:  Patient presents emergency department multiple symptoms.  She has a diffusely soft nontender abdomen along with normal vital signs.  No findings on exam to strongly suspect infectious etiology of headache.  Describes a bad taste in her mouth after cleaning a toothbrush with a disinfectant wipe.  Advised that she discard this toothbrush and begin with a new brush.  Advise she continue home medications and return with any new or suddenly worsening symptoms.  Disposition: discharge  ____________________________________________  FINAL CLINICAL IMPRESSION(S) / ED DIAGNOSES  Final diagnoses:  Malaise    Note:  This document was prepared using Dragon voice recognition software and may include unintentional dictation errors.  Alona Bene, MD, [facility] Emergency Medicine    Aaric Dolph, Arlyss Repress, MD 09/03/21 [phone]

## 2021-09-02 ENCOUNTER — Ambulatory Visit: Payer: Medicaid Other | Admitting: Allergy

## 2021-09-07 ENCOUNTER — Telehealth: Payer: Self-pay | Admitting: Physician Assistant

## 2021-09-07 DIAGNOSIS — T7840XD Allergy, unspecified, subsequent encounter: Secondary | ICD-10-CM

## 2021-09-07 NOTE — Telephone Encounter (Signed)
Please advise to food testing

## 2021-09-07 NOTE — Telephone Encounter (Signed)
Patient would like to be tested for foods specifically, coconuts, tomatoes, green beans and collard greens.   Please advise  Best contact number: [phone]

## 2021-09-08 ENCOUNTER — Telehealth: Payer: Self-pay | Admitting: Allergy & Immunology

## 2021-09-08 DIAGNOSIS — K9049 Malabsorption due to intolerance, not elsewhere classified: Secondary | ICD-10-CM

## 2021-09-08 NOTE — Telephone Encounter (Signed)
I anticipate that they will all be negative. Orders placed. We do not have collard green IgE, but the other three were ordered.   Malachi Bonds, MD [facility]

## 2021-09-08 NOTE — Telephone Encounter (Signed)
Called patient - DOB verified - advised of provider's notation. Patient asked if cloves was an option that bloodwork could done on as well - advised yes.   Advised patient no appt is needed for lab work only, she can come between the hours of 9am - 5 pm M-F; Lab paperwork will be printed and given to Lab technician.  Patient verbalized understanding lab work will be for the following: tomato, green beans, coconut and cloves.

## 2021-09-08 NOTE — Telephone Encounter (Signed)
Please advise for this patient, thank you.

## 2021-09-08 NOTE — Telephone Encounter (Signed)
Carol Schwartz called in and states that she was told by Apolonio Schneiders she had a seafood allergy.  She states she is scared about cross contamination when she goes out to eat and has food delivered and wants to know what can she do?  Please advise.

## 2021-09-09 NOTE — Telephone Encounter (Signed)
I would just make sure that she talk to whoever she orders her food from about her allergy.  The kitchen can take steps to limit cross contamination with cooking utensils.  Obviously she should have her epinephrine autoinjector handy as well.  Hopefully we have sent 1 of those in?  If not, we definitely should. ? ?I also ordered the seafood panel.  She can get that drawn when she gets her other food allergy labs drawn.  We ordered some yesterday.  This will clarify which seafood she is allergic to. ? ?Malachi Bonds, MD ?[facility] ? ?

## 2021-09-09 NOTE — Telephone Encounter (Signed)
I called the patient and left a message for her to call the office back to go over allergy concerns.  ?

## 2021-09-14 ENCOUNTER — Telehealth: Payer: Self-pay | Admitting: Allergy & Immunology

## 2021-09-14 DIAGNOSIS — K9049 Malabsorption due to intolerance, not elsewhere classified: Secondary | ICD-10-CM

## 2021-09-14 NOTE — Telephone Encounter (Signed)
Carol Schwartz called in and would like to be blood allergy tested for Mozzarella cheese, spinach, beet root, and cows milk.  Please advise.  ?

## 2021-09-16 NOTE — Telephone Encounter (Signed)
Spoken to patient and given Dr Ellouise Newer comments. Patient verbalized understanding.  ?

## 2021-09-16 NOTE — Telephone Encounter (Signed)
She has not even gotten the latest labs done that we ordered late last month. ? ?We did milk testing on blood and skin. Both were negative. There is no need for milk or cheese since those were both negative. ? ?Spinach and beet IgE added.  ? ?I still do not think that this is food allergies at all.  ? ?Salvatore Marvel, MD ?[facility] [place] [facility] ? ? ? ?

## 2021-09-19 LAB — ALLERGEN, BEET: Red Beet: <0.1 kU/L

## 2021-09-19 LAB — F214-IGE SPINACH: F214-IgE Spinach: <0.1 kU/L

## 2021-09-20 LAB — ALLERGEN, TOMATO F25: Allergen Tomato, IgE: <0.1 kU/L

## 2021-09-20 LAB — ALLERGEN, GREEN BEAN, RF315: Allergen Green Bean IgE: <0.1 kU/L

## 2021-09-20 LAB — ALLERGEN COCONUT IGE: Allergen Coconut IgE: <0.1 kU/L

## 2021-09-20 LAB — F268-IGE CLOVES: F268-IgE Cloves: <0.1 kU/L

## 2021-09-24 LAB — ALLERGY PANEL 19, SEAFOOD GROUP
Allergen Salmon IgE: <0.1 kU/L
Catfish: <0.1 kU/L
Codfish IgE: <0.1 kU/L
F023-IgE Crab: 0.41 kU/L — AB
F080-IgE Lobster: 0.6 kU/L — AB
Shrimp IgE: 0.86 kU/L — AB
Tuna: <0.1 kU/L

## 2021-10-05 ENCOUNTER — Institutional Professional Consult (permissible substitution): Payer: Medicaid Other | Admitting: Pulmonary Disease

## 2021-10-07 ENCOUNTER — Telehealth: Payer: Self-pay | Admitting: Allergy & Immunology

## 2021-10-07 NOTE — Telephone Encounter (Signed)
Called and spoke with patient and calmed her concerns about cross contamination of shellfish in a grocery store. Patient verbalized understanding.  ?

## 2021-10-07 NOTE — Telephone Encounter (Signed)
Patient called and would like to have someone call her back about her allergic to shellfish. She is worry about the contaminations .[phone].  ?

## 2021-10-20 ENCOUNTER — Telehealth: Payer: Self-pay | Admitting: Allergy & Immunology

## 2021-10-20 DIAGNOSIS — K9049 Malabsorption due to intolerance, not elsewhere classified: Secondary | ICD-10-CM

## 2021-10-20 NOTE — Telephone Encounter (Signed)
Carol Schwartz called in and states she has one more test she would like to have performed.  She would like to be blood tested for olives, avocado, agave sweetener, maple syrup, red palm fruit, sunflower oil, safflower oil, and canola oil.  Please advise.  ?

## 2021-10-21 NOTE — Telephone Encounter (Signed)
I fully anticipate that these will be negative.  ? ?I did order sunflower IgE, olive IgE, and avocado IgE.  ? ?We do not have testing available for agave sweetener and maple syrup. Likewise, there is no testing for red palm fruit. There is no testing for safflower or canola oil. I would recommend avoiding these items if she thinks that she reacting to them. That would answer the question.  ? ?Malachi Bonds, MD ?[facility] ? ?

## 2021-10-21 NOTE — Telephone Encounter (Signed)
Called and advised patient. Patient verbalized understanding.  

## 2021-11-03 ENCOUNTER — Ambulatory Visit: Payer: Medicaid Other | Admitting: Allergy & Immunology

## 2021-11-06 ENCOUNTER — Telehealth: Payer: Self-pay | Admitting: Allergy & Immunology

## 2021-11-06 NOTE — Telephone Encounter (Signed)
Patient states she ate waffle fries from [employer] about an hour ago and began to break out in hives. She has a rash on her arm and is itchy all over. I asked patient if she has taken any allergy medication and she stated no. Patient would like to know what she needs to do/take.  ? ?Best pharmacy- ?[facility] on [address]  ?

## 2021-11-06 NOTE — Telephone Encounter (Signed)
Agree with plan 

## 2021-11-06 NOTE — Telephone Encounter (Signed)
Patient has been taking omeprazole and hydroxyzine. Patient has not taken any allergy medication and has not picked up the allergy medication we sent in for her at her last visit. I let her know that the xyzal is waiting for her to pick up at her pharmacy with 5 refills. I also went over with her that the hydroxyzine is to help with itching and needs to be spaced out by 8 hours.  ?

## 2021-11-17 ENCOUNTER — Ambulatory Visit: Payer: Medicaid Other | Admitting: Allergy & Immunology

## 2021-11-23 ENCOUNTER — Telehealth: Payer: Self-pay | Admitting: Allergy & Immunology

## 2021-11-23 NOTE — Telephone Encounter (Signed)
Anessia called in and states she is currently taking levocetirizine and Omeprazole and wants to know if these two are ok to take together?  She would also like to know if these medications are ok to take with her birth control norethindrone?  Please advise? ?

## 2021-11-24 NOTE — Telephone Encounter (Signed)
That is fine to take these items together. ? ?Carol Bonds, MD ?[facility] ? ?

## 2021-11-24 NOTE — Telephone Encounter (Signed)
Spoke to patient and let her know per Dr. Dellis Anes that it was ok to be taking levocetirizine and omeprazole together. ?

## 2021-11-24 NOTE — Telephone Encounter (Signed)
Carol Schwartz called back and states she would really like her questions answered.  Please advise.  ?

## 2021-11-24 NOTE — Telephone Encounter (Signed)
Spoken to patient and notified Dr Gallagher's comments. Verbalized understanding.   

## 2021-11-25 ENCOUNTER — Telehealth: Payer: Self-pay

## 2021-11-25 NOTE — Telephone Encounter (Signed)
Patient called the office for advise in what to do when about an episode she recently had at home. Patient stated that she was having a reaction to something she touched at home (her throat started to close up and it lasted for a minute on and off). Asked patient what specially she touched and she stated that it is random things like the coach, etc. Went back into chart to see what patient is allergic to. Dust mites were on her list. Questioned patient on how often she is vacuuming the furniture and house. Patient states it is once a week. She also states that she is taking her allergy medication daily. Inquired if she had benadryl at the house for emergencies and she stated that she did not. Please advise as to what you would like me to advise the patient to do in situations like this. ?

## 2021-11-26 NOTE — Telephone Encounter (Signed)
I would probably double her Xyzal to BID to help decrease her allergic reactions.   During episodes like she experienced, I would do Benadryl 50mg  every 4-6 hours as needed.   , MD [facility]

## 2021-11-26 NOTE — Telephone Encounter (Signed)
Patient called back and I went over the information provided by Dr. Dellis Anes. Patient stated that she understood.

## 2021-11-26 NOTE — Telephone Encounter (Signed)
LVM for patient to call the office back pertaining to her questions and recent phone call.

## 2021-11-28 ENCOUNTER — Other Ambulatory Visit: Payer: Self-pay

## 2021-11-28 ENCOUNTER — Emergency Department (HOSPITAL_COMMUNITY)
Admission: EM | Admit: 2021-11-28 | Discharge: 2021-11-28 | Disposition: A | Discharge status: Home / Self Care | Payer: Medicaid Other | Attending: Student | Admitting: Student

## 2021-11-28 DIAGNOSIS — L299 Pruritus, unspecified: Secondary | ICD-10-CM | POA: Diagnosis not present

## 2021-11-28 DIAGNOSIS — R531 Weakness: Secondary | ICD-10-CM | POA: Insufficient documentation

## 2021-11-28 DIAGNOSIS — R630 Anorexia: Secondary | ICD-10-CM | POA: Insufficient documentation

## 2021-11-28 DIAGNOSIS — R5383 Other fatigue: Secondary | ICD-10-CM | POA: Diagnosis not present

## 2021-11-28 DIAGNOSIS — R000 Tachycardia, unspecified: Secondary | ICD-10-CM | POA: Insufficient documentation

## 2021-11-28 DIAGNOSIS — R5381 Other malaise: Secondary | ICD-10-CM | POA: Insufficient documentation

## 2021-11-28 MED ORDER — HYDROXYZINE HCL 25 MG PO TABS: 25.0000 mg | ORAL_TABLET | Freq: Three times a day (TID) | ORAL | 0 refills | Status: DC | PRN

## 2021-11-28 NOTE — ED Triage Notes (Incomplete)
[facility] . Patient c/o gen. Weakness x 6 months. Patient reported she has changed her diet due to food allergies and has since felt weak. VS WNL w/ EMS .

## 2021-11-28 NOTE — ED Provider Notes (Signed)
[facility]-EMERGENCY DEPT Provider Note   CSN: [id number] Arrival date & time: 11/28/21  1409     History  Chief Complaint  Patient presents with   Weakness    Cathleen CortiKristen Norfolk is a 26 y.o. female.  26 year old female presents today for evaluation of itching following certain food intake.  She states she had similar symptoms in the past was diagnosed with GERD after receiving endoscopy by gastroenterology.  Currently she denies nausea, vomiting, diarrhea, abdominal pain.  She states over the past 2 weeks she has noticed the symptoms following potatoes, and greens.  She states she consumes a vegan diet because of have any food she is reacted to in the past.  Endorses some fatigue due to decreased intake otherwise denies any complaints or concerns.  She is without chest pain, shortness of breath.  Is established with gastroenterology and plans to follow-up with them.  The history is provided by the patient. No language interpreter was used.      Home Medications Prior to Admission medications   Medication Sig Start Date End Date Taking? Authorizing Provider  hydrOXYzine (ATARAX/VISTARIL) 25 MG tablet Take 1 tablet (25 mg total) by mouth every 8 (eight) hours as needed for up to 30 doses for anxiety. 05/23/21   Terald Sleeperrifan, Matthew J, MD  ibuprofen (ADVIL) 600 MG tablet Take 1,200 mg by mouth 2 (two) times daily as needed. 09/18/20   [provider]  levocetirizine (XYZAL) 5 MG tablet Take 1 tablet (5 mg total) by mouth every evening. 08/04/21   Alfonse SpruceGallagher, Joel Louis, MD  norethindrone (AYGESTIN) 5 MG tablet Take 5 mg by mouth daily. 10/29/20   [provider]  omeprazole (PRILOSEC) 20 MG capsule Take 1 capsule (20 mg total) by mouth daily. 08/04/21   Alfonse SpruceGallagher, Joel Louis, MD      Allergies    Patient has no known allergies.    Review of Systems   Review of Systems  Constitutional:  Negative for activity change, appetite change and fever.  Respiratory:   Negative for shortness of breath.   Cardiovascular:  Negative for chest pain.  Gastrointestinal:  Negative for abdominal pain, nausea and vomiting.  Neurological:  Negative for light-headedness.  All other systems reviewed and are negative.  Physical Exam Updated Vital Signs BP 120/87 (BP Location: Left Arm)   Pulse 100   Temp 97.6 F (36.4 C) (Oral)   Resp 17   Ht 5\' 8"  (1.727 m)   LMP 04/21/2019 Comment: says she takes birth control that makes bleeding stop  SpO2 98%   BMI 38.77 kg/m  Physical Exam Vitals and nursing note reviewed.  Constitutional:      General: She is not in acute distress.    Appearance: Normal appearance. She is not ill-appearing.     Comments: Disheveled appearance  HENT:     Head: Normocephalic and atraumatic.     Nose: Nose normal.  Eyes:     General: No scleral icterus.    Extraocular Movements: Extraocular movements intact.     Conjunctiva/sclera: Conjunctivae normal.  Cardiovascular:     Rate and Rhythm: Normal rate and regular rhythm.     Pulses: Normal pulses.     Heart sounds: Normal heart sounds.     Comments: Initially recorded to be tachycardic.  Patient with heart rate of 90 during my interview. Pulmonary:     Effort: Pulmonary effort is normal. No respiratory distress.     Breath sounds: Normal breath sounds. No wheezing or  rales.  Abdominal:     General: There is no distension.     Palpations: Abdomen is soft.     Tenderness: There is no abdominal tenderness. There is no guarding.  Musculoskeletal:        General: No deformity. Normal range of motion.     Cervical back: Normal range of motion.     Right lower leg: No edema.     Left lower leg: No edema.  Skin:    General: Skin is warm and dry.     Findings: No rash.  Neurological:     General: No focal deficit present.     Mental Status: She is alert and oriented to person, place, and time. Mental status is at baseline.     Comments: Full range of motion bilateral upper and  lower extremities with 5/5 strength in extensor and flexor muscle groups.    ED Results / Procedures / Treatments   Labs (all labs ordered are listed, but only abnormal results are displayed) Labs Reviewed - No data to display  EKG None  Radiology No results found.  Procedures Procedures    Medications Ordered in ED Medications - No data to display  ED Course/ Medical Decision Making/ A&P                           Medical Decision Making  Medical Decision Making / ED Course   This patient presents to the ED for concern of itching following fluid intake, fatigue, this involves an extensive number of treatment options, and is a complaint that carries with it a high risk of complications and morbidity.  The differential diagnosis includes URI, gastroenteritis, anxiety  MDM: 26 year old female with past medical history significant for GERD presents today for evaluation of itching following certain food intake.  She has noticed over the past 2 weeks that she has this reaction following potatoes, and grains.  Denies any shortness of breath, throat swelling, or other signs or symptoms concerning for anaphylactic reaction.  Endorses mild decrease in p.o. intake.  However denies any weight loss.  Without any gastroenteritis symptoms.  No acute concerns identified.  Patient is disheveled appearing however otherwise well-appearing.  Considered work-up with labs however no acute concerns to warrant work-up at this time.  Initially noted to be tachycardic however during my interview patient remained with rate around 90.  For the itching, fluid intake I will provide patient with hydroxyzine to use as needed.  Discussed importance of follow-up with gastroenterologist.  Return precautions discussed.  Patient voices understanding and is in agreement with plan.  Patient is appropriate for discharge.  Discharged in stable condition.   Lab Tests: -I ordered, reviewed, and interpreted labs.   The  pertinent results include:   Labs Reviewed - No data to display    EKG  EKG Interpretation  Date/Time:    Ventricular Rate:    PR Interval:    QRS Duration:   QT Interval:    QTC Calculation:   R Axis:     Text Interpretation:           Medicines ordered and prescription drug management: No orders of the defined types were placed in this encounter.   -I have reviewed the patients home medicines and have made adjustments as needed  Reevaluation: After the interventions noted above, I reevaluated the patient and found that they have :stayed the same Remained without any complaints in the emergency room.  Co morbidities that complicate the patient evaluation  Past Medical History:  Diagnosis Date   Anxiety    GERD (gastroesophageal reflux disease)       Dispostion: Patient is appropriate for discharge.  Discharged in stable condition.  Return precautions discussed.  Patient voices understanding and is in agreement with plan.  Hydroxyzine prescribed.  Final Clinical Impression(s) / ED Diagnoses Final diagnoses:  Malaise    Rx / DC Orders ED Discharge Orders          Ordered    hydrOXYzine (ATARAX) 25 MG tablet  Every 8 hours PRN        11/28/21 1512              Marita Kansas, PA-C 11/28/21 1514    Kommor, Wyn Forster, MD 11/29/21 1356

## 2021-11-28 NOTE — ED Notes (Signed)
Patient dispo is set for discharge. This nurse asked patient who will pick patient up. Patient stated she believed her brother would be picking her, she tried to call him and is waiting for him to call back. EMS initially reported that patient had BH history. To ensure patient safety, patient will remain in room until ride arrives.patient not appropriate for lobby.

## 2021-11-28 NOTE — Discharge Instructions (Signed)
Your exam and vitals today were reassuring.  As discussed follow-up with your gastroenterologist.  If you have any signs or symptom concerning for anaphylactic reaction such as throat swelling, difficulty breathing following fluid intakes please seek immediate emergency room evaluation.  I have sent antibiotics into the emergency room for you.  I recommend you follow-up with gastroenterology.

## 2021-11-29 ENCOUNTER — Ambulatory Visit (HOSPITAL_COMMUNITY)
Admission: EM | Admit: 2021-11-29 | Discharge: 2021-11-29 | Disposition: A | Discharge status: Home / Self Care | Payer: Medicaid Other

## 2021-11-29 DIAGNOSIS — F411 Generalized anxiety disorder: Secondary | ICD-10-CM

## 2021-11-29 NOTE — Discharge Instructions (Addendum)

## 2021-11-29 NOTE — ED Provider Notes (Signed)
[facility] Medical Screening Exam  Patient Name: Carol Schwartz MRN: [id number] Date of Evaluation: 11/29/21 Chief Complaint:   Diagnosis:  Final diagnoses:  Generalized anxiety disorder    History of Present illness: Carol Schwartz is a 26 y.o. female with psychiatric history of anxiety. Patient presented voluntarily to [facility] for a walk-in assessment.   Patient was seen face to face and her chart was reviewed by this nurse petitioner.  On assessment patient is alert and oriented x4.  Patient's physical appearance is disheveled. She is speaking in normal tone of voice at normal rate with good eye contact. No evidence of mania or psychosis noted. She denies SI, HI, paranoia, hallucination, and substance abuse. She reports history of depression but denies current depressive symptoms.  Patient reports that she is frustrated due to negative reactions to foods that she consumes. She reports generalized itching when she eats collard greens, potatoes, and various grains. She says she has had multiple work-up with her GI and allergy doctor. She reports poor intake and anxiety due to reaction to food intake. She says she has lost an unknown amount of weight due to poor oral intake. Per chart review, patient was seen at [facility] ED earlier today with the similar complaint. She was discharged from the ED with prescription for Hydroxyzine 25mg  TID prn and instructed to follow-up with her gastroenterologist.   Psychiatric Specialty Exam  Presentation  General Appearance:Disheveled  Eye Contact:Good  Speech:Clear and Coherent  Speech Volume:Normal  Handedness:Right   Mood and Affect  Mood:Euthymic  Affect:Congruent   Thought Process  Thought Processes:Coherent  Descriptions of Associations:Intact  Orientation:Full (Time, Place and Person)  Thought Content:WDL    Hallucinations:None  Ideas of Reference:None  Suicidal Thoughts:No  Homicidal  Thoughts:No   Sensorium  Memory:Immediate Good; Recent Good; Remote Good  Judgment:Good  Insight:Good   Executive Functions  Concentration:Good  Attention Span:Good  Recall:Good  Fund of Knowledge:Good  Language:Good   Psychomotor Activity  Psychomotor Activity:Normal   Assets  Assets:Communication Skills; Desire for Improvement; Social Support; Housing; Physical Health   Sleep  Sleep:Good  Number of hours: 8   No data recorded  Physical Exam: Physical Exam Vitals and nursing note reviewed.  Constitutional:      General: She is not in acute distress.    Appearance: She is well-developed. She is not ill-appearing or toxic-appearing (dischelved).  HENT:     Head: Normocephalic and atraumatic.  Eyes:     Conjunctiva/sclera: Conjunctivae normal.  Cardiovascular:     Rate and Rhythm: Normal rate.  Pulmonary:     Effort: Pulmonary effort is normal. No respiratory distress.  Abdominal:     Palpations: Abdomen is soft.     Tenderness: There is no abdominal tenderness.  Musculoskeletal:        General: No swelling.     Cervical back: Normal range of motion and neck supple.  Skin:    General: Skin is warm and dry.  Neurological:     Mental Status: She is alert and oriented to person, place, and time.  Psychiatric:        Attention and Perception: Attention and perception normal.        Mood and Affect: Mood normal.        Speech: Speech normal.        Behavior: Behavior normal. Behavior is cooperative.        Thought Content: Thought content normal.        Cognition and Memory: Cognition normal.  Review of Systems  Constitutional: Negative.   HENT: Negative.    Eyes: Negative.   Respiratory: Negative.    Cardiovascular: Negative.   Gastrointestinal: Negative.   Genitourinary: Negative.   Musculoskeletal: Negative.   Skin: Negative.   Neurological: Negative.   Endo/Heme/Allergies: Negative.   Psychiatric/Behavioral:  Negative for depression,  hallucinations, memory loss, substance abuse and suicidal ideas. The patient is nervous/anxious. The patient does not have insomnia.   Temperature 98.5 F (36.9 C), temperature source Oral. There is no height or weight on file to calculate BMI.  Musculoskeletal: Strength & Muscle Tone: within normal limits Gait & Station: normal Patient leans: Right   [facility] MSE Discharge Disposition for Follow up and Recommendations: Based on my evaluation the patient does not appear to have an emergency medical condition and can be discharged with resources and follow up care in outpatient services for Medication Management and Individual Therapy  Per chart review, patient was seen at [facility] ED earlier today with the similar complaint. She was discharged from the ED with prescription for Hydroxyzine 25mg  TID prn and instructed to follow-up with her gastroenterologist.  Patient was once again instructed to follow-up with outpatient gastroenterologist and allergist for further work-up. She was also instructed to abstine from food that she has allergic reactions to/irritant. She was instructed to take hydroxyzine as prescribe to help relieve anxiety and allergy symptoms.    , NP 11/29/2021, 8:25 PM

## 2021-11-29 NOTE — Progress Notes (Signed)
TRIAGE: ROUTINE Leandro Reasoner, NP, reviewed pt's chart and information and met with pt face-to-face and determined pt can be psych cleared with information for outpatient services.   11/29/21 1920  [place] Triage Screening (Walk-ins at [facility] only)  How Did You Hear About [place]? Family/Friend  What Is the Reason for Your Visit/Call Today? Pt states, "Over the past year, since October 2022, I've had constant health issues. It seesm like I can't eat or drink anything. I'm having reactions to environmental allergies. I've been having fits - I can't deal with it." Pt describes her "fits" as becoming upset and crying. Pt denies SI or a hx of SI. She denies any prior attempts to kill herself, a plan to kill herself, or any prior hospitalizations for mental health concerns. Pt denies HI, AVH, NSSIB, access to guns/weapons, engagement with the legal system, or SA. Pt provided verbal consent for clinician to make contact with her mother; 3 attempts to make contact wtih pt's mother were unsuccessful (the phone automatically went to voicemail). Pt provided verbal consent for clinician to make contact with her brother. Pt's brother expressed concern that pt has been severely depressed, which has resulted in her medical concerns, and that she is in need of assistance. Clinician contacted pt's brother after receiving disposition (psych cleared) and provided him information for walk-in services; clinician explained those services, recommending that pt come in tomorrow so she can get services established. Pt's brother expressed an understanding and knowledge that he can bring pt back at any time for any ongoing mental health concerns.  How Long Has This Been Causing You Problems? > than 6 months  Have You Recently Had Any Thoughts About Hurting Yourself? No  Are You Planning to Commit Suicide/Harm Yourself At This time? No  Have you Recently Had Thoughts About [place]? No  Are You Planning To Harm Someone At This  Time? No  Are you currently experiencing any auditory, visual or other hallucinations? No  Have You Used Any Alcohol or Drugs in the Past 24 Hours? No  Do you have any current medical co-morbidities that require immediate attention? No  Clinician description of patient physical appearance/behavior: Pt is discheveled; her hair is unkempt and she is wearing two different shoes. Pt is able to express her thoughts, feelings, and concerns and answers the questions posed.  What Do You Feel Would Help You the Most Today? Treatment for Depression or other mood problem;Medication(s);Stress Management  If access to [facility] was not available, would you have sought care in the Emergency Department? Yes  Determination of Need Routine (7 days)  Options For Referral Medication Management;Outpatient Therapy

## 2021-11-29 NOTE — ED Notes (Signed)
Patient is unsatisfied with the answers she received here - reinforced the discharge instructions and hours to be here to see a therapist and/or a psychatrist in the am. She asked where else she could go for "another opinion." Explained to patient that there were the emergency rooms around she could go to for a second opinion.  Verbalizes understanding and verified she does not want to be seen here again

## 2021-11-30 ENCOUNTER — Inpatient Hospital Stay (HOSPITAL_COMMUNITY)
Admission: AD | Admit: 2021-11-30 | Discharge: 2021-12-08 | DRG: 885 | Disposition: A | Discharge status: Home / Self Care | Payer: Medicaid Other | Source: Intra-hospital | Attending: Psychiatry | Admitting: Psychiatry

## 2021-11-30 ENCOUNTER — Ambulatory Visit (HOSPITAL_COMMUNITY)
Admission: EM | Admit: 2021-11-30 | Discharge: 2021-11-30 | Disposition: A | Discharge status: Other Acute Inpatient Hospital | Payer: Medicaid Other | Attending: Student in an Organized Health Care Education/Training Program | Admitting: Student in an Organized Health Care Education/Training Program

## 2021-11-30 ENCOUNTER — Other Ambulatory Visit: Payer: Self-pay

## 2021-11-30 ENCOUNTER — Encounter (HOSPITAL_COMMUNITY): Payer: Self-pay | Admitting: Student in an Organized Health Care Education/Training Program

## 2021-11-30 DIAGNOSIS — Z20822 Contact with and (suspected) exposure to covid-19: Secondary | ICD-10-CM | POA: Insufficient documentation

## 2021-11-30 DIAGNOSIS — F410 Panic disorder [episodic paroxysmal anxiety] without agoraphobia: Secondary | ICD-10-CM | POA: Diagnosis present

## 2021-11-30 DIAGNOSIS — Z91013 Allergy to seafood: Secondary | ICD-10-CM | POA: Diagnosis not present

## 2021-11-30 DIAGNOSIS — F411 Generalized anxiety disorder: Secondary | ICD-10-CM | POA: Diagnosis present

## 2021-11-30 DIAGNOSIS — F333 Major depressive disorder, recurrent, severe with psychotic symptoms: Secondary | ICD-10-CM | POA: Insufficient documentation

## 2021-11-30 DIAGNOSIS — F323 Major depressive disorder, single episode, severe with psychotic features: Secondary | ICD-10-CM | POA: Diagnosis present

## 2021-11-30 DIAGNOSIS — F459 Somatoform disorder, unspecified: Secondary | ICD-10-CM | POA: Diagnosis present

## 2021-11-30 DIAGNOSIS — Z8249 Family history of ischemic heart disease and other diseases of the circulatory system: Secondary | ICD-10-CM

## 2021-11-30 DIAGNOSIS — Z833 Family history of diabetes mellitus: Secondary | ICD-10-CM

## 2021-11-30 DIAGNOSIS — N946 Dysmenorrhea, unspecified: Secondary | ICD-10-CM | POA: Diagnosis present

## 2021-11-30 DIAGNOSIS — K219 Gastro-esophageal reflux disease without esophagitis: Secondary | ICD-10-CM | POA: Diagnosis present

## 2021-11-30 DIAGNOSIS — R000 Tachycardia, unspecified: Secondary | ICD-10-CM | POA: Diagnosis present

## 2021-11-30 LAB — LIPID PANEL
Cholesterol: 160 mg/dL (ref 0–200)
HDL: 26 mg/dL — ABNORMAL LOW (ref >40)
LDL Cholesterol: 125 mg/dL — ABNORMAL HIGH (ref 0–99)
Total CHOL/HDL Ratio: 6.2 RATIO
Triglycerides: 44 mg/dL (ref <150)
VLDL: 9 mg/dL (ref 0–40)

## 2021-11-30 LAB — CBC WITH DIFFERENTIAL/PLATELET
Abs Immature Granulocytes: 0.01 10*3/uL (ref 0.00–0.07)
Basophils Absolute: 0 10*3/uL (ref 0.0–0.1)
Basophils Relative: 0 %
Eosinophils Absolute: 0.1 10*3/uL (ref 0.0–0.5)
Eosinophils Relative: 1 %
HCT: 44.9 % (ref 36.0–46.0)
Hemoglobin: 15.1 g/dL — ABNORMAL HIGH (ref 12.0–15.0)
Immature Granulocytes: 0 %
Lymphocytes Relative: 29 %
Lymphs Abs: 2 10*3/uL (ref 0.7–4.0)
MCH: 32.1 pg (ref 26.0–34.0)
MCHC: 33.6 g/dL (ref 30.0–36.0)
MCV: 95.3 fL (ref 80.0–100.0)
Monocytes Absolute: 0.6 10*3/uL (ref 0.1–1.0)
Monocytes Relative: 9 %
Neutro Abs: 4.2 10*3/uL (ref 1.7–7.7)
Neutrophils Relative %: 61 %
Platelets: 214 10*3/uL (ref 150–400)
RBC: 4.71 MIL/uL (ref 3.87–5.11)
RDW: 15.2 % (ref 11.5–15.5)
WBC: 7 10*3/uL (ref 4.0–10.5)
nRBC: 0 % (ref 0.0–0.2)

## 2021-11-30 LAB — RESP PANEL BY RT-PCR (FLU A&B, COVID) ARPGX2
Influenza A by PCR: NEGATIVE
Influenza B by PCR: NEGATIVE
SARS Coronavirus 2 by RT PCR: NEGATIVE

## 2021-11-30 LAB — URINALYSIS, ROUTINE W REFLEX MICROSCOPIC
Bilirubin Urine: NEGATIVE
Glucose, UA: NEGATIVE mg/dL
Hgb urine dipstick: NEGATIVE
Ketones, ur: 20 mg/dL — AB
Leukocytes,Ua: NEGATIVE
Nitrite: NEGATIVE
Protein, ur: 30 mg/dL — AB
Specific Gravity, Urine: 1.017 (ref 1.005–1.030)
pH: 6 (ref 5.0–8.0)

## 2021-11-30 LAB — POCT URINE DRUG SCREEN - MANUAL ENTRY (I-SCREEN)
POC Amphetamine UR: NOT DETECTED
POC Buprenorphine (BUP): NOT DETECTED
POC Cocaine UR: NOT DETECTED
POC Marijuana UR: NOT DETECTED
POC Methadone UR: NOT DETECTED
POC Methamphetamine UR: NOT DETECTED
POC Morphine: NOT DETECTED
POC Oxazepam (BZO): NOT DETECTED
POC Oxycodone UR: NOT DETECTED
POC Secobarbital (BAR): NOT DETECTED

## 2021-11-30 LAB — TSH: TSH: 3.855 u[IU]/mL (ref 0.350–4.500)

## 2021-11-30 LAB — HEMOGLOBIN A1C
Hgb A1c MFr Bld: 4.7 % — ABNORMAL LOW (ref 4.8–5.6)
Mean Plasma Glucose: 88.19 mg/dL

## 2021-11-30 LAB — COMPREHENSIVE METABOLIC PANEL
ALT: 27 U/L (ref 0–44)
AST: 26 U/L (ref 15–41)
Albumin: 3.9 g/dL (ref 3.5–5.0)
Alkaline Phosphatase: 58 U/L (ref 38–126)
Anion gap: 11 (ref 5–15)
BUN: 6 mg/dL (ref 6–20)
CO2: 24 mmol/L (ref 22–32)
Calcium: 9.8 mg/dL (ref 8.9–10.3)
Chloride: 105 mmol/L (ref 98–111)
Creatinine, Ser: 0.83 mg/dL (ref 0.44–1.00)
GFR, Estimated: >60 mL/min (ref >60)
Glucose, Bld: 91 mg/dL (ref 70–99)
Potassium: 3.7 mmol/L (ref 3.5–5.1)
Sodium: 140 mmol/L (ref 135–145)
Total Bilirubin: 1.9 mg/dL — ABNORMAL HIGH (ref 0.3–1.2)
Total Protein: 7.4 g/dL (ref 6.5–8.1)

## 2021-11-30 LAB — POCT PREGNANCY, URINE: Preg Test, Ur: NEGATIVE

## 2021-11-30 LAB — PREGNANCY, URINE: Preg Test, Ur: NEGATIVE

## 2021-11-30 LAB — POC SARS CORONAVIRUS 2 AG -  ED: SARS Coronavirus 2 Ag: NEGATIVE

## 2021-11-30 LAB — ETHANOL: Alcohol, Ethyl (B): <10 mg/dL (ref <10)

## 2021-11-30 MED ORDER — ALUM & MAG HYDROXIDE-SIMETH 200-200-20 MG/5ML PO SUSP: 30.0000 mL | ORAL | Status: DC | PRN

## 2021-11-30 MED ORDER — MAGNESIUM HYDROXIDE 400 MG/5ML PO SUSP: 30.0000 mL | Freq: Every day | ORAL | Status: DC | PRN

## 2021-11-30 MED ORDER — ENSURE ENLIVE PO LIQD
237.0000 mL | Freq: Two times a day (BID) | ORAL | Status: DC
  Administered 2021-12-01 – 2021-12-02 (×2): 237 mL via ORAL
  Filled 2021-11-30 (×17): qty 237

## 2021-11-30 MED ORDER — OLANZAPINE 5 MG PO TABS
5.0000 mg | ORAL_TABLET | Freq: Every day | ORAL | Status: DC
  Filled 2021-11-30: qty 1

## 2021-11-30 MED ORDER — NORETHINDRONE ACETATE 5 MG PO TABS
5.0000 mg | ORAL_TABLET | Freq: Every day | ORAL | Status: DC
  Filled 2021-11-30: qty 1

## 2021-11-30 MED ORDER — OLANZAPINE 5 MG PO TABS: 5.0000 mg | ORAL_TABLET | Freq: Every day | ORAL | 0 refills | Status: DC

## 2021-11-30 MED ORDER — FLUOXETINE HCL 10 MG PO CAPS: 10.0000 mg | ORAL_CAPSULE | Freq: Every day | ORAL | 3 refills | Status: DC

## 2021-11-30 MED ORDER — OLANZAPINE 5 MG PO TABS
5.0000 mg | ORAL_TABLET | Freq: Every day | ORAL | Status: DC
Start: 2021-12-01 — End: 2021-11-30

## 2021-11-30 MED ORDER — PANTOPRAZOLE SODIUM 40 MG PO TBEC
40.0000 mg | DELAYED_RELEASE_TABLET | Freq: Every day | ORAL | Status: DC
  Administered 2021-11-30 – 2021-12-08 (×9): 40 mg via ORAL
  Filled 2021-11-30 (×11): qty 1

## 2021-11-30 MED ORDER — HYDROXYZINE HCL 25 MG PO TABS
25.0000 mg | ORAL_TABLET | Freq: Three times a day (TID) | ORAL | Status: DC | PRN
  Administered 2021-12-02: 25 mg via ORAL
  Filled 2021-11-30: qty 1

## 2021-11-30 MED ORDER — DIPHENHYDRAMINE HCL 25 MG PO CAPS
25.0000 mg | ORAL_CAPSULE | Freq: Three times a day (TID) | ORAL | Status: DC | PRN
  Administered 2021-11-30 – 2021-12-07 (×7): 25 mg via ORAL
  Filled 2021-11-30 (×7): qty 1

## 2021-11-30 MED ORDER — ACETAMINOPHEN 325 MG PO TABS: 650.0000 mg | ORAL_TABLET | Freq: Four times a day (QID) | ORAL | Status: DC | PRN

## 2021-11-30 MED ORDER — HYDROXYZINE HCL 25 MG PO TABS: 25.0000 mg | ORAL_TABLET | Freq: Three times a day (TID) | ORAL | Status: DC | PRN

## 2021-11-30 MED ORDER — LORATADINE 10 MG PO TABS
10.0000 mg | ORAL_TABLET | Freq: Every evening | ORAL | Status: DC
  Administered 2021-11-30 – 2021-12-07 (×8): 10 mg via ORAL
  Filled 2021-11-30 (×10): qty 1

## 2021-11-30 MED ORDER — FLUOXETINE HCL 10 MG PO CAPS
10.0000 mg | ORAL_CAPSULE | Freq: Every day | ORAL | Status: DC
  Administered 2021-12-01: 10 mg via ORAL
  Filled 2021-11-30 (×4): qty 1

## 2021-11-30 MED ORDER — DIPHENHYDRAMINE HCL 50 MG PO CAPS: 50.0000 mg | ORAL_CAPSULE | Freq: Four times a day (QID) | ORAL | Status: DC | PRN

## 2021-11-30 MED ORDER — OLANZAPINE 2.5 MG PO TABS
2.5000 mg | ORAL_TABLET | Freq: Once | ORAL | Status: AC
  Administered 2021-11-30: 2.5 mg via ORAL
  Filled 2021-11-30: qty 1

## 2021-11-30 MED ORDER — FLUOXETINE HCL 10 MG PO CAPS
10.0000 mg | ORAL_CAPSULE | Freq: Every day | ORAL | Status: DC
  Administered 2021-11-30: 10 mg via ORAL
  Filled 2021-11-30: qty 1

## 2021-11-30 NOTE — Progress Notes (Signed)
   11/30/21 0836  [facility] Triage Screening (Walk-ins at [facility] only)  What Is the Reason for Your Visit/Call Today? Patient was seen here last night and evaluated, with recommendation to follow up outpatient.  Resources were provided to include walk in hours for [facility].  It appears she has presented for walk in hours upstairs and was not redirected to the outpatient program.   Per triage note last night:   Patient reports, "Over the past year, since October 2022, I've had constant health issues. It seesm like I can't eat or drink anything. I'm having reactions to environmental allergies. I've been having fits - I can't deal with it." Pt describes her "fits" as becoming upset and crying. Pt denies SI or a hx of SI. She denies any prior attempts to kill herself, a plan to kill herself, or any prior hospitalizations for mental health concerns. Pt denies HI, AVH, NSSIB, access to guns/weapons, engagement with the legal system, or SA. Pt provided verbal consent for clinician to make contact with her mother; 3 attempts to make contact wtih pt's mother were unsuccessful (the phone automatically went to voicemail). Pt provided verbal consent for clinician to make contact with her brother. Pt's brother expressed concern that pt has been severely depressed, which has resulted in her medical concerns, and that she is in need of assistance. Clinician contacted pt's brother after receiving disposition (psych cleared) and provided him information for walk-in services; clinician explained those services, recommending that pt come in tomorrow so she can get services established. Pt's brother expressed an understanding and knowledge that he can bring pt back at any time for any ongoing mental health concerns."  How Long Has This Been Causing You Problems? > than 6 months  Have You Recently Had Any Thoughts About Hurting Yourself? No  Are You Planning to Commit Suicide/Harm Yourself At This time? No  Have you Recently Had Thoughts  About Hurting Someone Karolee Ohs? No  Are You Planning To Harm Someone At This Time? No  Are you currently experiencing any auditory, visual or other hallucinations? No  Have You Used Any Alcohol or Drugs in the Past 24 Hours? No  Do you have any current medical co-morbidities that require immediate attention? No  Clinician description of patient physical appearance/behavior: Patient appears disheveled, dressed in what appear to be pajamas, hair unkempt.  She is calm, cooperative, pleasant AAOx5.  What Do You Feel Would Help You the Most Today? Treatment for Depression or other mood problem  If access to [facility] was not available, would you have sought care in the Emergency Department? No  Determination of Need Routine (7 days)  Options For Referral Medication Management;Outpatient Therapy

## 2021-11-30 NOTE — Progress Notes (Signed)
Flynn took a nap, woke up and gave a urine specimen. She requested gloves to wear and her request was denied. She denied all of the psychiatric symptoms. She did not touch her lunch.

## 2021-11-30 NOTE — BH Assessment (Signed)
Comprehensive Clinical Assessment (CCA) Note  11/30/2021 Carol Schwartz [id number]  Disposition: Per Dr. Eliseo Gum, inpatient treatment is recommended. [facility] to review.  Disposition SW to pursue appropriate inpatient options.  The patient demonstrates the following risk factors for suicide: Chronic risk factors for suicide include: psychiatric disorder of R/O MDD, GAD . Acute risk factors for suicide include: social withdrawal/isolation. Protective factors for this patient include: positive social support, responsibility to others (children, family), and coping skills. Considering these factors, the overall suicide risk at this point appears to be low. Patient is appropriate for outpatient follow up once stabilized.    Patient is a 26 year old female with a history of untreated depression and anxiety who presents voluntarily, prompted by brother, to [facility] for assessment.  Patient was seen here last night and evaluated, with recommendation to follow up outpatient.  Resources were provided to include walk in hours for [facility].  It appears she has presented for walk in hours upstairs and was not redirected to the outpatient program.   Per triage note last night:   Patient reports, "Over the past year, since October 2022, I've had constant health issues. It seems like I can't eat or drink anything. I'm having reactions to environmental allergies. I've been having fits - I can't deal with it." Pt describes her "fits" as becoming upset and crying. Patient has no psychiatric history.  She denies SI, HI, AVH, NSSIB, access to guns/weapons, engagement with the legal system, or SA hx. Patient provided verbal consent for clinician to make contact with her mother; 3 attempts to make contact wtih pt's mother were unsuccessful (the phone automatically went to voicemail). Patient provided verbal consent for clinician to make contact with her brother. Patient's brother expressed concern that patient  has been severely depressed, which has resulted in her medical concerns, and that she is in need of assistance. Clinician contacted pt's brother after receiving disposition (psych cleared) and provided him information for walk-in services; clinician explained those services, recommending that pt come in tomorrow so she can get services established. Pt's brother expressed an understanding and knowledge that he can bring pt back at any time for any ongoing mental health concerns."  Upon assessment today, patient reports her brother woke her up to bring her here.  She continues to discuss various ongoing medical concerns that have kept her from being able to work since Jan 2022.  She was experiencing various reactions to food and ultimately diagnosed with GERD.  She has had some recent "reactions" to foods she hadn't had issues with before.  This has caused anxiety surrounding eating, and she states she hasn't eaten much since Nov 05, 2022.  She has expressed some feelings of "doom" to the provider, expressing anxiety about eating, fearing she may have these feelings again. Patient shares she has been having panic attacks and has called EMS, fearing she may die.  Patient is open to treatment recommendations and in agreement with recommendation for inpatient treatment.  Patient's brother was interviewed by provider.  It appears he is concerned for patient's worsening depression.  He shared she has struggled since her father's death in 11-05-2015.  She dropped out of college without telling family and returned home.  She told family at the time that she would create new plans, however she has struggled to adjust since returning home.     Chief Complaint: No chief complaint on file.  Visit Diagnosis: R/O Major Depressive Disorder, recurrent, moderate  R/O GAD  Flowsheet Row ED from 11/30/2021 in [facility]  Thoughts that you would be better off dead, or of hurting  yourself in some way Not at all  PHQ-9 Total Score 8      Flowsheet Row ED from 11/30/2021 in [facility] ED from 11/28/2021 in [facility] from 08/29/2021 in [facility] EMERGENCY DEPARTMENT  C-SSRS RISK CATEGORY No Risk No Risk No Risk        CCA Screening, Triage and Referral (STR)  Patient Reported Information How did you hear about [place]? Family/Friend  What Is the Reason for Your Visit/Call Today? Patient was seen here last night and evaluated, with recommendation to follow up outpatient.  Resources were provided to include walk in hours for [facility].  It appears she has presented for walk in hours upstairs and was not redirected to the outpatient program.   Per triage note last night:   Patient reports, "Over the past year, since October 2022, I've had constant health issues. It seesm like I can't eat or drink anything. I'm having reactions to environmental allergies. I've been having fits - I can't deal with it." Pt describes her "fits" as becoming upset and crying. Pt denies SI or a hx of SI. She denies any prior attempts to kill herself, a plan to kill herself, or any prior hospitalizations for mental health concerns. Pt denies HI, AVH, NSSIB, access to guns/weapons, engagement with the legal system, or SA. Pt provided verbal consent for clinician to make contact with her mother; 3 attempts to make contact wtih pt's mother were unsuccessful (the phone automatically went to voicemail). Pt provided verbal consent for clinician to make contact with her brother. Pt's brother expressed concern that pt has been severely depressed, which has resulted in her medical concerns, and that she is in need of assistance. Clinician contacted pt's brother after receiving disposition (psych cleared) and provided him information for walk-in services; clinician explained those services, recommending that pt come in tomorrow so she can  get services established. Pt's brother expressed an understanding and knowledge that he can bring pt back at any time for any ongoing mental health concerns."  How Long Has This Been Causing You Problems? > than 6 months  What Do You Feel Would Help You the Most Today? Treatment for Depression or other mood problem   Have You Recently Had Any Thoughts About Hurting Yourself? No  Are You Planning to Commit Suicide/Harm Yourself At This time? No   Have you Recently Had Thoughts About Hurting Someone Karolee Ohs? No  Are You Planning to Harm Someone at This Time? No  Explanation: No data recorded  Have You Used Any Alcohol or Drugs in the Past 24 Hours? No  How Long Ago Did You Use Drugs or Alcohol? No data recorded What Did You Use and How Much? No data recorded  Do You Currently Have a Therapist/Psychiatrist? No  Name of Therapist/Psychiatrist: No data recorded  Have You Been Recently Discharged From Any Office Practice or Programs? No  Explanation of Discharge From Practice/Program: No data recorded    CCA Screening Triage Referral Assessment Type of Contact: Face-to-Face  Telemedicine Service Delivery:   Is this Initial or Reassessment? No data recorded Date Telepsych consult ordered in [facility]:  No data recorded Time Telepsych consult ordered in [facility]:  No data recorded Location of Assessment: [facility] Assessment Services  Provider Location: [facility] Assessment Services  Collateral Involvement: No data recorded  Does Patient Have a Court Appointed Legal Guardian? No data recorded Name and Contact of Legal Guardian: No data recorded If Minor and Not Living with Parent(s), Who has Custody? No data recorded Is CPS involved or ever been involved? Never  Is APS involved or ever been involved? Never   Patient Determined To Be At Risk for Harm To Self or Others Based on Review of Patient Reported Information or Presenting Complaint? No  Method: No data recorded Availability of  Means: No data recorded Intent: No data recorded Notification Required: No data recorded Additional Information for Danger to Others Potential: No data recorded Additional Comments for Danger to Others Potential: No data recorded Are There Guns or Other Weapons in Your Home? No data recorded Types of Guns/Weapons: No data recorded Are These Weapons Safely Secured?                            No data recorded Who Could Verify You Are Able To Have These Secured: No data recorded Do You Have any Outstanding Charges, Pending Court Dates, Parole/Probation? No data recorded Contacted To Inform of Risk of Harm To Self or Others: No data recorded   Does Patient Present under Involuntary Commitment? No  IVC Papers Initial File Date: No data recorded  [place] of Residence: [place]   Patient Currently Receiving the Following Services: Not Receiving Services   Determination of Need: Urgent (48 hours)   Options For Referral: Medication Management; Outpatient Therapy; Inpatient Hospitalization     CCA Biopsychosocial Patient Reported Schizophrenia/Schizoaffective Diagnosis in Past: No   Strengths: Interested in treatment, has family support   Mental Health Symptoms Depression:   Weight gain/loss; Difficulty Concentrating; Irritability   Duration of Depressive symptoms:  Duration of Depressive Symptoms: Greater than two weeks   Mania:   None   Anxiety:    Worrying; Tension   Psychosis:   None   Duration of Psychotic symptoms:  Duration of Psychotic Symptoms: Greater than six months   Trauma:   None   Obsessions:   None   Compulsions:   None   Inattention:   N/A   Hyperactivity/Impulsivity:   N/A   Oppositional/Defiant Behaviors:   N/A   Emotional Irregularity:   None   Other Mood/Personality Symptoms:  No data recorded   Mental Status Exam Appearance and self-care  Stature:   Average   Weight:   Overweight   Clothing:   Disheveled   Grooming:    Neglected   Cosmetic use:   None   Posture/gait:   Normal   Motor activity:   Not Remarkable   Sensorium  Attention:   Normal   Concentration:   Normal   Orientation:   X5   Recall/memory:   Normal   Affect and Mood  Affect:   Anxious; Depressed   Mood:   Anxious; Depressed   Relating  Eye contact:   Normal   Facial expression:   Responsive   Attitude toward examiner:   Cooperative   Thought and Language  Speech flow:  Clear and Coherent   Thought content:   Appropriate to Mood and Circumstances   Preoccupation:   None   Hallucinations:   None   Organization:  No data recorded  [employer] of Knowledge:   Average   Intelligence:   Average   Abstraction:   Normal   Judgement:   Microbiologist  Testing:   Adequate   Insight:   Gaps   Decision Making:   Normal   Social Functioning  Social Maturity:   Isolates   Social Judgement:   Normal   Stress  Stressors:   Grief/losses; Relationship; Family conflict   Coping Ability:   Exhausted   Skill Deficits:   Interpersonal; Responsibility; Communication   Supports:   Family     Religion: Religion/Spirituality Are You A Religious Person?: No  Leisure/Recreation: Leisure / Recreation Do You Have Hobbies?: No  Exercise/Diet: Exercise/Diet Do You Exercise?: No Have You Gained or Lost A Significant Amount of Weight in the Past Six Months?: Yes-Lost Number of Pounds Lost?:  (unknown - limited intake, having various reactions with certain foods.) Do You Follow a Special Diet?: No Do You Have Any Trouble Sleeping?: No   CCA Employment/Education Employment/Work Situation: Employment / Work Situation Employment Situation: Unemployed Has Patient ever Been in Equities traderthe Military?: No  Education: Education Is Patient Currently Attending School?: No Last Grade Completed: 12 Did You Product managerAttend College?: Yes What Type of College Degree Do you Have?: Started at  [employer], however left the program due to them being disorganized and putting in her in the wrong classes. Did You Have An Individualized Education Program (IIEP): No Did You Have Any Difficulty At School?: No Patient's Education Has Been Impacted by Current Illness: No   CCA Family/Childhood History Family and Relationship History: Family history Marital status: Single Does patient have children?: No  Childhood History:  Childhood History By whom was/is the patient raised?: Mother Did patient suffer any verbal/emotional/physical/sexual abuse as a child?: No Did patient suffer from severe childhood neglect?: No Has patient ever been sexually abused/assaulted/raped as an adolescent or adult?: No Was the patient ever a victim of a crime or a disaster?: No Witnessed domestic violence?: No Has patient been affected by domestic violence as an adult?: No  Child/Adolescent Assessment:     CCA Substance Use Alcohol/Drug Use: Alcohol / Drug Use Pain Medications: denies Prescriptions: denies Over the Counter: denies History of alcohol / drug use?: No history of alcohol / drug abuse                         ASAM's:  Six Dimensions of Multidimensional Assessment  Dimension 1:  Acute Intoxication and/or Withdrawal Potential:      Dimension 2:  Biomedical Conditions and Complications:      Dimension 3:  Emotional, Behavioral, or Cognitive Conditions and Complications:     Dimension 4:  Readiness to Change:     Dimension 5:  Relapse, Continued use, or Continued Problem Potential:     Dimension 6:  Recovery/Living Environment:     ASAM Severity Score:    ASAM Recommended Level of Treatment:     Substance use Disorder (SUD)    Recommendations for Services/Supports/Treatments:    Discharge Disposition:    DSM5 Diagnoses: Patient Active Problem List   Diagnosis Date Noted   Ovarian cyst 11/13/2020   Dysmenorrhea 11/13/2020     Referrals to Alternative  Service(s): Referred to Alternative Service(s):   Place:   Date:   Time:    Referred to Alternative Service(s):   Place:   Date:   Time:    Referred to Alternative Service(s):   Place:   Date:   Time:    Referred to Alternative Service(s):   Place:   Date:   Time:     Yetta GlassmanKerrie L Michaiah Holsopple, [facility]

## 2021-11-30 NOTE — ED Provider Notes (Signed)
FBC/OBS ASAP Discharge Summary  Date and Time: 11/30/2021 6:40 PM  Name: Carol Schwartz  MRN:  [id number]   Discharge Diagnoses:  Final diagnoses:  Severe episode of recurrent major depressive disorder, with psychotic features (HCC)  Panic disorder    Subjective:  On the day of discharge the patient exhibited odd affect but was calm and tried to engage in interview.   Stay Summary: The patient was admitted to the Observation unit for approximately 10 hours. Within that time, there were no behavioral issues.  Please today's note by Dr. Eliseo Gum for further details regarding this patient's presenting symptoms.   Total Time spent with patient: 15 minutes  Past Psychiatric History: anxiety Past Medical History:  Past Medical History:  Diagnosis Date   Anxiety    GERD (gastroesophageal reflux disease)     Past Surgical History:  Procedure Laterality Date   LAPAROSCOPY  02/24/2012   Procedure: LAPAROSCOPY OPERATIVE;  Surgeon: Geryl Rankins, MD;  Location: [facility] OR[facility];  Service: Gynecology;  Laterality: N/A;  Drainage of Left Ovarian Cyst   OVARIAN CYST REMOVAL  02/24/2012   Procedure: OVARIAN CYSTECTOMY;  Surgeon: Geryl Rankins, MD;  Location: [facility] ORS;  Service: Gynecology;  Laterality: Right;   Family History:  Family History  Problem Relation Age of Onset   Allergic rhinitis Mother    Diabetes Father    Heart attack Father 36   Allergic rhinitis Brother    Hypertension Other    Family Psychiatric History: mother with depression Social History:  Social History   Substance and Sexual Activity  Alcohol Use No     Social History   Substance and Sexual Activity  Drug Use No    Social History   Socioeconomic History   Marital status: Single    Spouse name: Not on file   Number of children: 0   Years of education: Not on file   Highest education level: Not on file  Occupational History   Not on file  Tobacco Use   Smoking status: Never    Passive exposure: Never    Smokeless tobacco: Never  Vaping Use   Vaping Use: Never used  Substance and Sexual Activity   Alcohol use: No   Drug use: No   Sexual activity: Not Currently  Other Topics Concern   Not on file  Social History Narrative   Not on file   Social Determinants of Health   Financial Resource Strain: Not on file  Food Insecurity: Not on file  Transportation Needs: Not on file  Physical Activity: Not on file  Stress: Not on file  Social Connections: Not on file   SDOH:  SDOH Screenings   Alcohol Screen: Not on file  Depression (PHQ2-9): Medium Risk   PHQ-2 Score: 8  Financial Resource Strain: Not on file  Food Insecurity: Not on file  Housing: Not on file  Physical Activity: Not on file  Social Connections: Not on file  Stress: Not on file  Tobacco Use: Low Risk    Smoking Tobacco Use: Never   Smokeless Tobacco Use: Never   Passive Exposure: Never  Transportation Needs: Not on file    Tobacco Cessation:  A prescription for an FDA-approved tobacco cessation medication provided at discharge  Current Medications:  No current facility-administered medications for this encounter.   No current outpatient medications on file.    PTA Medications: (Not in a hospital admission)   Musculoskeletal  Strength & Muscle Tone: within normal limits Gait & Station: normal Patient  leans: N/A  Psychiatric Specialty Exam  Presentation  General Appearance: Disheveled; Bizarre  Eye Contact:Fair  Speech:Clear and Coherent  Speech Volume:Normal  Handedness:Right   Mood and Affect  Mood:Depressed  Affect:Congruent   Thought Process  Thought Processes:Coherent  Descriptions of Associations:Circumstantial  Orientation:Full (Time, Place and Person)  Thought Content:Delusions  Diagnosis of Schizophrenia or Schizoaffective disorder in past: No  Duration of Psychotic Symptoms: Greater than six months   Hallucinations:Hallucinations: None  Ideas of  Reference:Delusions  Suicidal Thoughts:Suicidal Thoughts: No  Homicidal Thoughts:Homicidal Thoughts: No   Sensorium  Memory:Immediate Good; Recent Good  Judgment:Fair  Insight:Shallow   Executive Functions  Concentration:Fair  Attention Span:Fair  Recall:Fair  Fund of Knowledge:Fair  Language:Fair   Psychomotor Activity  Psychomotor Activity:Psychomotor Activity: Mannerisms   Assets  Assets:Communication Skills; Desire for Improvement; Housing; Social Support; Resilience   Sleep  Sleep:Sleep: Fair Number of Hours of Sleep: 8   Nutritional Assessment (For OBS and FBC admissions only) Has the patient had a weight loss or gain of 10 pounds or more in the last 3 months?: -- (Unkown specific weight loss at this time) Has the patient had a decrease in food intake/or appetite?: Yes Does the patient have dental problems?: No Does the patient have eating habits or behaviors that may be indicators of an eating disorder including binging or inducing vomiting?: Yes Has the patient recently lost weight without trying?: 2.0 Has the patient been eating poorly because of a decreased appetite?: 1 Malnutrition Screening Tool Score: 3    Physical Exam  Physical Exam Constitutional:      Appearance: He is not toxic-appearing.  Pulmonary:     Effort: Pulmonary effort is normal.  Neurological:     General: No focal deficit present.     Mental Status: He is alert and oriented to person, place, and time.   Review of Systems  Respiratory:  Negative for shortness of breath.   Cardiovascular:  Negative for chest pain.  Gastrointestinal:  Negative for abdominal pain, constipation, diarrhea, nausea and vomiting.  Neurological:  Negative for headaches.   Blood pressure 125/87, pulse 90, temperature 99 F (37.2 C), resp. rate 18, weight 203 lb (92.1 kg), SpO2 98 %. Body mass index is 30.87 kg/m.  Demographic Factors:  Unemployed  Loss Factors: NA  Historical  Factors: NA  Risk Reduction Factors:   Living with another person, especially a relative  Continued Clinical Symptoms:  Severe Anxiety and/or Agitation  Cognitive Features That Contribute To Risk:  Loss of executive function    Suicide Risk:  Mild:  no identifiable suicidal ideation, but patient exhibits anxiety with loss of executive function.   Plan Of Care/Follow-up recommendations:  Per admitting team at [facility].   Disposition: to [facility] for inpatient stay.   Carlyn Reichert, MD 11/30/2021, 6:40 PM

## 2021-11-30 NOTE — Progress Notes (Signed)
Patient is 26 yrs, voluntary, went to [facility] with her brother several times.  Came to [facility] again this morning.  Matted hair, [facility] staff looked at her hair good, no bugs.  Anxiety, paranoid about food intake.  Stated she is allergic to food.  Patient denied SI and HI, contracts for safety.  Denied A/V hallucinations.  Denied pain.  Has not worked for [employer].  Very cooperative, quiet.  [facility] staff stated she talks to herself.  Patient stated she quit going to college.  Has  been home for a couple of yrs and regressed.   Patient stated she is single, no children.  Stated she has reactions to food, environmental reactions to food.  Went to cardiologist.  Possible GERD.    Fall risk information given, low fall risk. Patient oriented to [address] hall.  Declined food/drink.    Patient has been very cooperative and pleasant.

## 2021-11-30 NOTE — Discharge Instructions (Addendum)
Transfer to [facility] 

## 2021-11-30 NOTE — [facility] Group Notes (Signed)
Pt attended AA group 

## 2021-11-30 NOTE — Tx Team (Signed)
Initial Treatment Plan 11/30/2021 7:14 PM [name] WER:[id number]    PATIENT STRESSORS: Financial difficulties   Health problems   Medication change or noncompliance     PATIENT STRENGTHS: Capable of independent living  Communication skills  Supportive family/friends    PATIENT IDENTIFIED PROBLEMS: Depression  Anxiety  Ineffective coping skills  Poor appetite and hygiene               DISCHARGE CRITERIA:  Adequate post-discharge living arrangements Motivation to continue treatment in a less acute level of care Safe-care adequate arrangements made  PRELIMINARY DISCHARGE PLAN: Attend aftercare/continuing care group Outpatient therapy Return to previous living arrangement  PATIENT/FAMILY INVOLVEMENT: This treatment plan has been presented to and reviewed with the patient, Carol Schwartz, and/or family member.  The patient and family have been given the opportunity to ask questions and make suggestions.  Clarene Critchley, RN 11/30/2021, 7:14 PM

## 2021-11-30 NOTE — ED Provider Notes (Signed)
[facility] Continuous Assessment Admission H&P  Date: 11/30/21 Patient Name: Carol Schwartz MRN: [id number] Chief Complaint: No chief complaint on file.     Diagnoses:  Final diagnoses:  Severe episode of recurrent major depressive disorder, with psychotic features ([place])  Panic disorder    HPI: Carol Schwartz is a 26 yo patient w/ PPH of Anxiety who presents voluntarily with her older brother.   On assessment patient is unkept with mismatch shoes, matted hair, and appears to have a significant weight loss with skin hanging due to the weight loss. Patient reports that she has been "having a lot of health issues" and begins to describe that they started early last year. Patient reports she has not had a job since 07/2020 due to her health issues. Patient reports she suddenly developed pain in her R foot was dx with tendonitis, had sudden numbness and says she was placed on bed rest for 6 mon. Patient reports that she then began having problems in her other foot. Patient reports that her health issues progressed and she began having trouble sleeping and chest pain- specifically palpitations after eating. Patient endorses having a polysomnography that did not dx sleep apnea and reports she then went to a GI doc who dx her with GERD and received an EGD confirming dx. Patient reports she started taking mediation and felt that her palpitations got better, but suddenly she began to feel that she was having allergic relations to food. Patient reports that since October she has been very concerned about what she eats, because she feels that "the world is going to end" and she has palpitations, with GI upset as a result of eating. Patient reports that she has not really eating since last Thursday because she is scared to have these feelings again.   Patient reports that she has also been having multiple episodes a day of crying and irritability. Patient recalls that her husband died 1 mon before her high  school graduation in 2017. Patient reports that it was a sudden heart attack. Patient reports she went on to start college at [employer], but dropped out in her 2nd semester, due feeling out of place and overwhelmed. Patient reports that she continues to feel overwhelmed and reports "every time I try to move forward I can't ever get to a better place in my life." Patient reports she continues to sleep poorly endorsing that she has difficulties falling asleep, anhedonia, irritability, low energy decreased concentration, decreased appetite w/ significant weight loss. Patient denies SI, HI, and AVH. Patient also denies feeling of paranoia towards people but reports "I'm kind of paranoid about food."   Patient reports that she has had multiple panic attacks and endorses calling the ambulance due to the fear that she is going to die. Patient endorses having paplpations, fear of impeneding doom, GI upset amongst her symptoms.  Patient also does not endorse belief in special powers or any hx of episodes concerning for hypomanic or manic episodes. Patient also denies any hx of physical or sexual abuse and does not screen for symptoms of PTSD.  Patient denies any FH of mental health disease. Patient also denies hx of substance use including EtoH.   Collateral- brother Patient's older brother reports that he brought patient in because he concerned. Brother reports that patient lives with their mother and that patient is living in a mess that looks to brother as an outward expression of patient's depression. Brother reports that patient did not handle their father's death  very well and went away to college far from home. Brother reports that patient dropped out of college without telling the family and returned home. When patient came home, patient did tell her brother she was depressed. At the time patient told family she would create new plans, but thus far patient has not been able to adjust. Brother reports  that patient has only further spiraled and been having more health challenges. Brother reports that patient is now wearing the same clothes 3 days at a time, lost a lot of weight and overall he is very concerned.   PHQ 2-9:  [place] [facility] from 11/30/2021 in [facility]  Thoughts that you would be better off dead, or of hurting yourself in some way Not at all  PHQ-9 Total Score 8       [place] [facility] from 11/30/2021 in [facility] ED from 11/28/2021 in [place] DEPT ED from 08/29/2021 in [place] No Risk No Risk No Risk        Total Time spent with patient: 1 hour  Musculoskeletal  Strength & Muscle Tone: within normal limits Gait & Station: normal Patient leans: N/A  Psychiatric Specialty Exam  Presentation General Appearance: Disheveled; Bizarre (malodorous, clothes hanging off, hair matted, shoes mismatch, lips dry and cracked)  Eye Contact:Fair  Speech:Clear and Coherent  Speech Volume:Normal  Handedness:Right   Mood and Affect  Mood:Depressed  Affect:Congruent; Depressed   Thought Process  Thought Processes:Coherent  Descriptions of Associations:Circumstantial  Orientation:Full (Time, Place and Person)  Thought Content:Delusions  Diagnosis of Schizophrenia or Schizoaffective disorder in past: No  Duration of Psychotic Symptoms: Greater than six months  Hallucinations:Hallucinations: None  Ideas of Reference:Delusions  Suicidal Thoughts:Suicidal Thoughts: No  Homicidal Thoughts:Homicidal Thoughts: No   Sensorium  Memory:Immediate Good; Recent Good  Judgment:Fair  Insight:Shallow   Executive Functions  Concentration:Fair  Attention Span:Fair  [place] of Knowledge:Good  Language:Good   Psychomotor Activity  Psychomotor Activity:Psychomotor Activity: Mannerisms (sterotypical  movements in her BUE)   Assets  Assets:Communication Skills; Desire for Improvement; Housing; Social Support; Resilience   Sleep  Sleep:Sleep: Poor Number of Hours of Sleep: 8   Nutritional Assessment (For OBS and FBC admissions only) Has the patient had a weight loss or gain of 10 pounds or more in the last 3 months?: -- (Unkown specific weight loss at this time) Has the patient had a decrease in food intake/or appetite?: Yes Does the patient have dental problems?: No Does the patient have eating habits or behaviors that may be indicators of an eating disorder including binging or inducing vomiting?: Yes Has the patient recently lost weight without trying?: 2.0 Has the patient been eating poorly because of a decreased appetite?: 1 Malnutrition Screening Tool Score: 3    Physical Exam Constitutional:      Comments: Very unkept, hair matted, mismatch shoes, dry skin and lips  HENT:     Head: Atraumatic.  Pulmonary:     Effort: Pulmonary effort is normal.  Neurological:     Mental Status: She is alert and oriented to person, place, and time.   Review of Systems  Constitutional:  Positive for weight loss.  Psychiatric/Behavioral:  Positive for depression. Negative for hallucinations, substance abuse and suicidal ideas.    Blood pressure 125/87, pulse 90, temperature 99 F (37.2 C), temperature source Oral, resp. rate 18, SpO2 98 %. There is no height or  weight on file to calculate BMI.  Past Psychiatric History: Dx of Anxiety and Rx Hydroxyzine   Is the patient at risk to self? No  Has the patient been a risk to self in the past 6 months? No .    Has the patient been a risk to self within the distant past? No   Is the patient a risk to others? No   Has the patient been a risk to others in the past 6 months? No   Has the patient been a risk to others within the distant past? No   Past Medical History:  Past Medical History:  Diagnosis Date   Anxiety    GERD  (gastroesophageal reflux disease)     Past Surgical History:  Procedure Laterality Date   LAPAROSCOPY  02/24/2012   Procedure: LAPAROSCOPY OPERATIVE;  Surgeon: Thurnell Lose, MD;  Location: [facility];  Service: Gynecology;  Laterality: N/A;  Drainage of Left Ovarian Cyst   OVARIAN CYST REMOVAL  02/24/2012   Procedure: OVARIAN CYSTECTOMY;  Surgeon: Thurnell Lose, MD;  Location: [facility];  Service: Gynecology;  Laterality: Right;    Family History:  Family History  Problem Relation Age of Onset   Allergic rhinitis Mother    Diabetes Father    Heart attack Father 58   Allergic rhinitis Brother    Hypertension Other     Social History:  Social History   Socioeconomic History   Marital status: Single    Spouse name: Not on file   Number of children: 0   Years of education: Not on file   Highest education level: Not on file  Occupational History   Not on file  Tobacco Use   Smoking status: Never    Passive exposure: Never   Smokeless tobacco: Never  Vaping Use   Vaping Use: Never used  Substance and Sexual Activity   Alcohol use: No   Drug use: No   Sexual activity: Not Currently  Other Topics Concern   Not on file  Social History Narrative   Not on file   Social Determinants of Health   Financial Resource Strain: Not on file  Food Insecurity: Not on file  Transportation Needs: Not on file  Physical Activity: Not on file  Stress: Not on file  Social Connections: Not on file  Intimate Partner Violence: Not on file    SDOH:  SDOH Screenings   Alcohol Screen: Not on file  Depression (PHQ2-9): Medium Risk   PHQ-2 Score: 8  Financial Resource Strain: Not on file  Food Insecurity: Not on file  Housing: Not on file  Physical Activity: Not on file  Social Connections: Not on file  Stress: Not on file  Tobacco Use: Low Risk    Smoking Tobacco Use: Never   Smokeless Tobacco Use: Never   Passive Exposure: Never  Transportation Needs: Not on file    Last Labs:   Admission on 11/30/2021  Component Date Value Ref Range Status   SARS Coronavirus 2 by RT PCR 11/30/2021 NEGATIVE  NEGATIVE Final   Comment: (NOTE) SARS-CoV-2 target nucleic acids are NOT DETECTED.  The SARS-CoV-2 RNA is generally detectable in upper respiratory specimens during the acute phase of infection. The lowest concentration of SARS-CoV-2 viral copies this assay can detect is 138 copies/mL. A negative result does not preclude SARS-Cov-2 infection and should not be used as the sole basis for treatment or other patient management decisions. A negative result may occur with  improper specimen collection/handling, submission  of specimen other than nasopharyngeal swab, presence of viral mutation(s) within the areas targeted by this assay, and inadequate number of viral copies(<138 copies/mL). A negative result must be combined with clinical observations, patient history, and epidemiological information. The expected result is Negative.  Fact Sheet for Patients:  EntrepreneurPulse.com.au  Fact Sheet for Healthcare Providers:  IncredibleEmployment.be  This test is no                          t yet approved or cleared by the [place] FDA and  has been authorized for detection and/or diagnosis of SARS-CoV-2 by FDA under an Emergency Use Authorization (EUA). This EUA will remain  in effect (meaning this test can be used) for the duration of the COVID-19 declaration under Section 564(b)(1) of the Act, 21 U.S.C.section 360bbb-3(b)(1), unless the authorization is terminated  or revoked sooner.       Influenza A by PCR 11/30/2021 NEGATIVE  NEGATIVE Final   Influenza B by PCR 11/30/2021 NEGATIVE  NEGATIVE Final   Comment: (NOTE) The Xpert Xpress SARS-CoV-2/FLU/RSV plus assay is intended as an aid in the diagnosis of influenza from Nasopharyngeal swab specimens and should not be used as a sole basis for treatment. Nasal washings  and aspirates are unacceptable for Xpert Xpress SARS-CoV-2/FLU/RSV testing.  Fact Sheet for Patients: EntrepreneurPulse.com.au  Fact Sheet for Healthcare Providers: IncredibleEmployment.be  This test is not yet approved or cleared by the [place] FDA and has been authorized for detection and/or diagnosis of SARS-CoV-2 by FDA under an Emergency Use Authorization (EUA). This EUA will remain in effect (meaning this test can be used) for the duration of the COVID-19 declaration under Section 564(b)(1) of the Act, 21 U.S.C. section 360bbb-3(b)(1), unless the authorization is terminated or revoked.  Performed at [facility], [place] [address], [place], [place] [address]    WBC 11/30/2021 7.0  4.0 - 10.5 K/uL Final   RBC 11/30/2021 4.71  3.87 - 5.11 MIL/uL Final   Hemoglobin 11/30/2021 15.1 (H)  12.0 - 15.0 g/dL Final   HCT 11/30/2021 44.9  36.0 - 46.0 % Final   MCV 11/30/2021 95.3  80.0 - 100.0 fL Final   MCH 11/30/2021 32.1  26.0 - 34.0 pg Final   MCHC 11/30/2021 33.6  30.0 - 36.0 g/dL Final   RDW 11/30/2021 15.2  11.5 - 15.5 % Final   Platelets 11/30/2021 214  150 - 400 K/uL Final   nRBC 11/30/2021 0.0  0.0 - 0.2 % Final   Neutrophils Relative % 11/30/2021 61  % Final   Neutro Abs 11/30/2021 4.2  1.7 - 7.7 K/uL Final   Lymphocytes Relative 11/30/2021 29  % Final   Lymphs Abs 11/30/2021 2.0  0.7 - 4.0 K/uL Final   Monocytes Relative 11/30/2021 9  % Final   Monocytes Absolute 11/30/2021 0.6  0.1 - 1.0 K/uL Final   Eosinophils Relative 11/30/2021 1  % Final   Eosinophils Absolute 11/30/2021 0.1  0.0 - 0.5 K/uL Final   Basophils Relative 11/30/2021 0  % Final   Basophils Absolute 11/30/2021 0.0  0.0 - 0.1 K/uL Final   Immature Granulocytes 11/30/2021 0  % Final   Abs Immature Granulocytes 11/30/2021 0.01  0.00 - 0.07 K/uL Final   Performed at [facility], [place] [address] [place], [place] [address]   Sodium 11/30/2021 140  135 -  145 mmol/L Final   Potassium 11/30/2021 3.7  3.5 - 5.1 mmol/L  Final   Chloride 11/30/2021 105  98 - 111 mmol/L Final   CO2 11/30/2021 24  22 - 32 mmol/L Final   Glucose, Bld 11/30/2021 91  70 - 99 mg/dL Final   Glucose reference range applies only to samples taken after fasting for at least 8 hours.   BUN 11/30/2021 6  6 - 20 mg/dL Final   Creatinine, Ser 11/30/2021 0.83  0.44 - 1.00 mg/dL Final   Calcium 11/30/2021 9.8  8.9 - 10.3 mg/dL Final   Total Protein 11/30/2021 7.4  6.5 - 8.1 g/dL Final   Albumin 11/30/2021 3.9  3.5 - 5.0 g/dL Final   AST 11/30/2021 26  15 - 41 U/L Final   ALT 11/30/2021 27  0 - 44 U/L Final   Alkaline Phosphatase 11/30/2021 58  38 - 126 U/L Final   Total Bilirubin 11/30/2021 1.9 (H)  0.3 - 1.2 mg/dL Final   GFR, Estimated 11/30/2021 >60  >60 mL/min Final   Comment: (NOTE) Calculated using the CKD-EPI Creatinine Equation (2021)    Anion gap 11/30/2021 11  5 - 15 Final   Performed at [facility] [address], [place], [place] [address]   Hgb A1c MFr Bld 11/30/2021 4.7 (L)  4.8 - 5.6 % Final   Comment: (NOTE) Pre diabetes:          5.7%-6.4%  Diabetes:              >6.4%  Glycemic control for   <7.0% adults with diabetes    Mean Plasma Glucose 11/30/2021 88.19  mg/dL Final   Performed at [facility], [place] [address], [place], [place] [address]   Alcohol, Ethyl (B) 11/30/2021 <10  <10 mg/dL Final   Comment: (NOTE) Lowest detectable limit for serum alcohol is 10 mg/dL.  For medical purposes only. Performed at [facility], [place] [address], [place], [place] [address]    TSH 11/30/2021 3.855  0.350 - 4.500 uIU/mL Final   Comment: Performed by a 3rd Generation assay with a functional sensitivity of <=0.01 uIU/mL. Performed at [facility], [place] [address], [place], [place] [address]    POC Amphetamine UR 11/30/2021 None Detected  NONE DETECTED (Cut Off Level 1000 ng/mL) Final   POC Secobarbital (BAR) 11/30/2021 None Detected   NONE DETECTED (Cut Off Level 300 ng/mL) Final   POC Buprenorphine (BUP) 11/30/2021 None Detected  NONE DETECTED (Cut Off Level 10 ng/mL) Final   POC Oxazepam (BZO) 11/30/2021 None Detected  NONE DETECTED (Cut Off Level 300 ng/mL) Final   POC Cocaine UR 11/30/2021 None Detected  NONE DETECTED (Cut Off Level 300 ng/mL) Final   POC Methamphetamine UR 11/30/2021 None Detected  NONE DETECTED (Cut Off Level 1000 ng/mL) Final   POC Morphine 11/30/2021 None Detected  NONE DETECTED (Cut Off Level 300 ng/mL) Final   POC Methadone UR 11/30/2021 None Detected  NONE DETECTED (Cut Off Level 300 ng/mL) Final   POC Oxycodone UR 11/30/2021 None Detected  NONE DETECTED (Cut Off Level 100 ng/mL) Final   POC Marijuana UR 11/30/2021 None Detected  NONE DETECTED (Cut Off Level 50 ng/mL) Final   SARS Coronavirus 2 Ag 11/30/2021 Negative  Negative Final   Cholesterol 11/30/2021 160  0 - 200 mg/dL Final   Triglycerides 11/30/2021 44  <150 mg/dL Final   HDL 11/30/2021 26 (L)  >40 mg/dL Final   Total CHOL/HDL Ratio 11/30/2021 6.2  RATIO Final   VLDL 11/30/2021 9  0 -  40 mg/dL Final   LDL Cholesterol 11/30/2021 125 (H)  0 - 99 mg/dL Final   Comment:        Total Cholesterol/HDL:CHD Risk Coronary Heart Disease Risk Table                     Men   Women  1/2 Average Risk   3.4   3.3  Average Risk       5.0   4.4  2 X Average Risk   9.6   7.1  3 X Average Risk  23.4   11.0        Use the calculated Patient Ratio above and the CHD Risk Table to determine the patient's CHD Risk.        ATP III CLASSIFICATION (LDL):  <100     mg/dL   Optimal  100-129  mg/dL   Near or Above                    Optimal  130-159  mg/dL   Borderline  160-189  mg/dL   High  >190     mg/dL   Very High Performed at [facility] [address], [place], [place] [address]    Preg Test, Ur 11/30/2021 NEGATIVE  NEGATIVE Final   Comment:        THE SENSITIVITY OF THIS METHODOLOGY IS >24 mIU/mL   Telephone on 09/14/2021   Component Date Value Ref Range Status   F214-IgE Spinach 09/16/2021 <0.10  Class 0 kU/L Final   Red Beet 09/16/2021 <0.10  Class 0 kU/L Final   Comment:     Levels of Specific IgE       Class  Description of Class     ---------------------------  -----  --------------------                    < 0.10         0         Negative            0.10 -    0.31         0/I       Equivocal/Low            0.32 -    0.55         I         Low            0.56 -    1.40         II        Moderate            1.41 -    3.90         III       High            3.91 -   19.00         IV        Very High           19.01 -  100.00         V         Very High                   >100.00         VI        Very High   Telephone on 09/08/2021  Component Date Value Ref Range Status  Class Description Allergens 09/16/2021 Comment   Final   Comment:     Levels of Specific IgE       Class  Description of Class     ---------------------------  -----  --------------------                    < 0.10         0         Negative            0.10 -    0.31         0/I       Equivocal/Low            0.32 -    0.55         I         Low            0.56 -    1.40         II        Moderate            1.41 -    3.90         III       High            3.91 -   19.00         IV        Very High           19.01 -  100.00         V         Very High                   >100.00         VI        Very High    Codfish IgE 09/16/2021 <0.10  Class 0 kU/L Final   F023-IgE Crab 09/16/2021 0.41 (A)  Class I kU/L Final   Shrimp IgE 09/16/2021 0.86 (A)  Class II kU/L Final   Tuna 09/16/2021 <0.10  Class 0 kU/L Final   Allergen Salmon IgE 09/16/2021 <0.10  Class 0 kU/L Final   F080-IgE Lobster 09/16/2021 0.60 (A)  Class II kU/L Final   Catfish 09/16/2021 <0.10  Class 0 kU/L Final  Telephone on 09/07/2021  Component Date Value Ref Range Status   F268-IgE Cloves 09/16/2021 <0.10  Class 0 kU/L Final   Allergen Tomato, IgE 09/16/2021 <0.10   Class 0 kU/L Final   Comment:     Levels of Specific IgE       Class  Description of Class     ---------------------------  -----  --------------------                    < 0.10         0         Negative            0.10 -    0.31         0/I       Equivocal/Low            0.32 -    0.55         I         Low            0.56 -    1.40         II  Moderate            1.41 -    3.90         III       High            3.91 -   19.00         IV        Very High           19.01 -  100.00         V         Very High                   >100.00         VI        Very High    Allergen Coconut IgE 09/16/2021 <0.10  Class 0 kU/L Final   Allergen Green Bean IgE 09/16/2021 <0.10  Class 0 kU/L Final  Admission on 08/29/2021, Discharged on 08/29/2021  Component Date Value Ref Range Status   Color, Urine 08/29/2021 STRAW (A)  YELLOW Final   APPearance 08/29/2021 CLEAR  CLEAR Final   Specific Gravity, Urine 08/29/2021 1.003 (L)  1.005 - 1.030 Final   pH 08/29/2021 8.0  5.0 - 8.0 Final   Glucose, UA 08/29/2021 NEGATIVE  NEGATIVE mg/dL Final   Hgb urine dipstick 08/29/2021 NEGATIVE  NEGATIVE Final   Bilirubin Urine 08/29/2021 NEGATIVE  NEGATIVE Final   Ketones, ur 08/29/2021 5 (A)  NEGATIVE mg/dL Final   Protein, ur 08/29/2021 NEGATIVE  NEGATIVE mg/dL Final   Nitrite 08/29/2021 NEGATIVE  NEGATIVE Final   Leukocytes,Ua 08/29/2021 NEGATIVE  NEGATIVE Final   Performed at [facility], [address], [place], [place] [address]   Preg Test, Ur 08/29/2021 NEGATIVE  NEGATIVE Final   Comment:        THE SENSITIVITY OF THIS METHODOLOGY IS >20 mIU/mL. Performed at [facility], [place] [address] [place], [place] [address]    SARS Coronavirus 2 by RT PCR 08/29/2021 NEGATIVE  NEGATIVE Final   Comment: (NOTE) SARS-CoV-2 target nucleic acids are NOT DETECTED.  The SARS-CoV-2 RNA is generally detectable in upper respiratory specimens during the acute phase of infection. The lowest concentration  of SARS-CoV-2 viral copies this assay can detect is 138 copies/mL. A negative result does not preclude SARS-Cov-2 infection and should not be used as the sole basis for treatment or other patient management decisions. A negative result may occur with  improper specimen collection/handling, submission of specimen other than nasopharyngeal swab, presence of viral mutation(s) within the areas targeted by this assay, and inadequate number of viral copies(<138 copies/mL). A negative result must be combined with clinical observations, patient history, and epidemiological information. The expected result is Negative.  Fact Sheet for Patients:  EntrepreneurPulse.com.au  Fact Sheet for Healthcare Providers:  IncredibleEmployment.be  This test is no                          t yet approved or cleared by the [place] FDA and  has been authorized for detection and/or diagnosis of SARS-CoV-2 by FDA under an Emergency Use Authorization (EUA). This EUA will remain  in effect (meaning this test can be used) for the duration of the COVID-19 declaration under Section 564(b)(1) of the Act, 21 U.S.C.section 360bbb-3(b)(1), unless the authorization is terminated  or revoked sooner.       Influenza A by PCR 08/29/2021 NEGATIVE  NEGATIVE Final   Influenza B by PCR 08/29/2021 NEGATIVE  NEGATIVE Final   Comment: (NOTE) The Xpert Xpress SARS-CoV-2/FLU/RSV plus assay is intended as an aid in the diagnosis of influenza from Nasopharyngeal swab specimens and should not be used as a sole basis for treatment. Nasal washings and aspirates are unacceptable for Xpert Xpress SARS-CoV-2/FLU/RSV testing.  Fact Sheet for Patients: EntrepreneurPulse.com.au  Fact Sheet for Healthcare Providers: IncredibleEmployment.be  This test is not yet approved or cleared by the [place] FDA and has been authorized for detection and/or diagnosis  of SARS-CoV-2 by FDA under an Emergency Use Authorization (EUA). This EUA will remain in effect (meaning this test can be used) for the duration of the COVID-19 declaration under Section 564(b)(1) of the Act, 21 U.S.C. section 360bbb-3(b)(1), unless the authorization is terminated or revoked.  Performed at [facility], [place] [address], [place],  [address]   Telephone on 07/16/2021  Component Date Value Ref Range Status   Wheat IgE 07/16/2021 <0.10  Class 0 kU/L Final   Peanut IgE 07/16/2021 <0.10  Class 0 kU/L Final   Comment:     Levels of Specific IgE       Class  Description of Class     ---------------------------  -----  --------------------                    < 0.10         0         Negative            0.10 -    0.31         0/I       Equivocal/Low            0.32 -    0.55         I         Low            0.56 -    1.40         II        Moderate            1.41 -    3.90         III       High            3.91 -   19.00         IV        Very High           19.01 -  100.00         V         Very High                   >100.00         VI        Very High    Allergen Potato, White IgE 07/16/2021 <0.10  Class 0 kU/L Final   Allergen Lentil IgE 07/16/2021 <0.10  Class 0 kU/L Final   Mushroom IgE 07/16/2021 <0.10  Class 0 kU/L Final   Class Description Allergens 07/28/2021 Comment   Final   Comment:     Levels of Specific IgE       Class  Description of Class     ---------------------------  -----  --------------------                    < 0.10  0         Negative            0.10 -    0.31         0/I       Equivocal/Low            0.32 -    0.55         I         Low            0.56 -    1.40         II        Moderate            1.41 -    3.90         III       High            3.91 -   19.00         IV        Very High           19.01 -  100.00         V         Very High                   >100.00         VI        Very High    Penicillium Chrysogen  IgE 07/28/2021 <0.10  Class 0 kU/L Final   Cladosporium Herbarum IgE 07/28/2021 <0.10  Class 0 kU/L Final   Aspergillus Fumigatus IgE 07/28/2021 <0.10  Class 0 kU/L Final   Mucor Racemosus IgE 07/28/2021 <0.10  Class 0 kU/L Final   Candida Albicans IgE 07/28/2021 <0.10  Class 0 kU/L Final   Alternaria Alternata IgE 07/28/2021 <0.10  Class 0 kU/L Final   Setomelanomma Rostrat 07/28/2021 <0.10  Class 0 kU/L Final   M009-IgE Fusarium proliferatum 07/28/2021 <0.10  Class 0 kU/L Final   Aureobasidi Pullulans IgE 07/28/2021 <0.10  Class 0 kU/L Final   Phoma Betae IgE 07/28/2021 0.17 (A)  Class 0/I kU/L Final   M014-IgE Epicoccum purpur 07/28/2021 <0.10  Class 0 kU/L Final   Stemphylium Herbarum IgE 07/28/2021 <0.10  Class 0 kU/L Final  Office Visit on 06/19/2021  Component Date Value Ref Range Status   Class Description Allergens 07/16/2021 Comment   Final   Comment:     Levels of Specific IgE       Class  Description of Class     ---------------------------  -----  --------------------                    < 0.10         0         Negative            0.10 -    0.31         0/I       Equivocal/Low            0.32 -    0.55         I         Low            0.56 -    1.40         II        Moderate  1.41 -    3.90         III       High            3.91 -   19.00         IV        Very High           19.01 -  100.00         V         Very High                   >100.00         VI        Very High    IgE (Immunoglobulin E), Serum 07/16/2021 253  6 - 495 IU/mL Final   O215-IgE Alpha-Gal 07/16/2021 <0.10  Class 0 kU/L Final   Beef IgE 07/16/2021 <0.10  Class 0 kU/L Final   Pork IgE 07/16/2021 <0.10  Class 0 kU/L Final   Allergen Lamb IgE 07/16/2021 <0.10  Class 0 kU/L Final   ANA Titer 1 07/16/2021 Negative   Final   Comment:                                      Negative   <1:80                                      Borderline  1:80                                      Positive    >1:80 ICAP nomenclature: AC-0 For more information about Hep-2 cell patterns use ANApatterns.org, the official website for the International Consensus on Antinuclear Antibody (ANA) Patterns (ICAP).    cu index 07/16/2021 <3.1  <10 Final   Comment: The CU Index(R) test is the second generation Functional Anti-FceR test.  Patients with a CU Index(R) greater than or equal to 10 have basophil reactive factors in their serum which supports an autoimmune basis for disease. *This test was developed and its performance characteristics determined by [employer]. It has not been cleared or approved by the U.S. Food and Drug Administration.    CRP 07/16/2021 2  0 - 10 mg/L Final   Glucose 07/16/2021 79  70 - 99 mg/dL Final   BUN 07/16/2021 14  6 - 20 mg/dL Final   Creatinine, Ser 07/16/2021 0.85  0.57 - 1.00 mg/dL Final   eGFR 07/16/2021 99  >59 mL/min/1.73 Final   BUN/Creatinine Ratio 07/16/2021 16  9 - 23 Final   Sodium 07/16/2021 141  134 - 144 mmol/L Final   Potassium 07/16/2021 4.4  3.5 - 5.2 mmol/L Final   Chloride 07/16/2021 108 (H)  96 - 106 mmol/L Final   CO2 07/16/2021 20  20 - 29 mmol/L Final   Calcium 07/16/2021 9.1  8.7 - 10.2 mg/dL Final   Total Protein 07/16/2021 6.8  6.0 - 8.5 g/dL Final   Albumin 07/16/2021 4.0  3.9 - 5.0 g/dL Final   Globulin, Total 07/16/2021 2.8  1.5 - 4.5 g/dL Final   Albumin/Globulin Ratio 07/16/2021 1.4  1.2 - 2.2 Final   Bilirubin Total 07/16/2021 0.5  0.0 -  1.2 mg/dL Final   Alkaline Phosphatase 07/16/2021 75  44 - 121 IU/L Final   AST 07/16/2021 44 (H)  0 - 40 IU/L Final   ALT 07/16/2021 46 (H)  0 - 32 IU/L Final   Sed Rate 07/16/2021 32  0 - 32 mm/hr Final   Tryptase 07/16/2021 6.8  2.2 - 13.2 ug/L Final   Class Description Allergens 07/16/2021 Comment   Final   Comment:     Levels of Specific IgE       Class  Description of Class     ---------------------------  -----  --------------------                    < 0.10         0          Negative            0.10 -    0.31         0/I       Equivocal/Low            0.32 -    0.55         I         Low            0.56 -    1.40         II        Moderate            1.41 -    3.90         III       High            3.91 -   19.00         IV        Very High           19.01 -  100.00         V         Very High                   >100.00         VI        Very High    Milk IgE 07/16/2021 <0.10  Class 0 kU/L Final   Wheat IgE 07/16/2021 <0.10  Class 0 kU/L Final   Allergen Corn, IgE 07/16/2021 <0.10  Class 0 kU/L Final   Peanut IgE 07/16/2021 <0.10  Class 0 kU/L Final   Soybean IgE 07/16/2021 <0.10  Class 0 kU/L Final   Pork IgE 07/16/2021 <0.10  Class 0 kU/L Final   Beef IgE 07/16/2021 <0.10  Class 0 kU/L Final   Food Mix (Seafoods) IgE 07/16/2021 Positive (A)   Final   Comment: Allergens in this mix are:  Dillard's, Whole IgE 07/16/2021 <0.10  Class 0 kU/L Final   Chocolate/Cacao IgE 07/16/2021 <0.10  Class 0 kU/L Final   Allergen Strawberry IgE 07/16/2021 <0.10  Class 0 kU/L Final   Black Bean*, IgE 07/16/2021 <0.35  <0.35 kU/L Final   Class Interpretation 07/16/2021 0   Final   Comment: This conventional EIA uses allergen-coated discs from several suppliers and an enzyme-labeled anti-IgE. CLASS INTERPRETATION: <0.35 kU/L=0, Below Detection; 0.35-0.69 kU/L= 1, Low Positive; 0.70-3.49 kU/L= 2, Moderate Positive; 3.50-17.49 kU/L= 3, Positive; 17.50-49.99 kU/L= 4, Strong Positive; 50.00-99.99 kU/L= 5, Very Strong Positive; >99.99 kU/L= 6, Very Strong  Positive    Allergen Lettuce IgE 07/16/2021 <0.10  Class 0 kU/L Final   Allergen Ginger IgE 07/16/2021 <0.10  Class 0 kU/L Final   Class Description Allergens 07/16/2021 Comment   Final   Comment:     Levels of Specific IgE       Class  Description of Class     ---------------------------  -----  --------------------                    < 0.10         0         Negative             0.10 -    0.31         0/I       Equivocal/Low            0.32 -    0.55         I         Low            0.56 -    1.40         II        Moderate            1.41 -    3.90         III       High            3.91 -   19.00         IV        Very High           19.01 -  100.00         V         Very High                   >100.00         VI        Very High    IgE (Immunoglobulin E), Serum 07/16/2021 289  6 - 495 IU/mL Final   D Pteronyssinus IgE 07/16/2021 0.89 (A)  Class II kU/L Final   D Farinae IgE 07/16/2021 1.45 (A)  Class III kU/L Final   E001-IgE Cat Dander 07/16/2021 <0.10  Class 0 kU/L Final   E005-IgE Dog Dander 07/16/2021 <0.10  Class 0 kU/L Final   Gu[place] Grass IgE 07/16/2021 <0.10  Class 0 kU/L Final   Timothy Grass IgE 07/16/2021 <0.10  Class 0 kU/L Final   Johnson Grass IgE 07/16/2021 <0.10  Class 0 kU/L Final   Cockroach, German IgE 07/16/2021 3.55 (A)  Class III kU/L Final   Penicillium Chrysogen IgE 07/16/2021 <0.10  Class 0 kU/L Final   Cladosporium Herbarum IgE 07/16/2021 <0.10  Class 0 kU/L Final   Aspergillus Fumigatus IgE 07/16/2021 <0.10  Class 0 kU/L Final   Alternaria Alternata IgE 07/16/2021 <0.10  Class 0 kU/L Final   Maple/Box Elder IgE 07/16/2021 <0.10  Class 0 kU/L Final   Common Silver Wendee Copp IgE 07/16/2021 <0.10  Class 0 kU/L Final   Cedar, [place] IgE 07/16/2021 0.45 (A)  Class I kU/L Final   Oak, White IgE 07/16/2021 0.11 (A)  Class 0/I kU/L Final   Elm, American IgE 07/16/2021 <0.10  Class 0 kU/L Final   Cottonwood IgE 07/16/2021 <0.10  Class 0 kU/L Final   Pecan, Hickory IgE 07/16/2021 0.20 (A)  Class 0/I kU/L Final   White Mulberry IgE  07/16/2021 <0.10  Class 0 kU/L Final   Ragweed, Short IgE 07/16/2021 0.19 (A)  Class 0/I kU/L Final   Pigweed, Rough IgE 07/16/2021 0.11 (A)  Class 0/I kU/L Final   Sheep Sorrel IgE Qn 07/16/2021 <0.10  Class 0 kU/L Final   Mouse Urine IgE 07/16/2021 <0.10  Class 0 kU/L Final   F309-IgE Chick Pea 07/16/2021 <0.10   Class 0 kU/L Final   Mint IgE 07/16/2021 <0.10  Class 0 kU/L Final   Allergen Black Pepper IgE 07/16/2021 <0.10  Class 0 kU/L Final   Comment:     Levels of Specific IgE       Class  Description of Class     ---------------------------  -----  --------------------                    < 0.10         0         Negative            0.10 -    0.31         0/I       Equivocal/Low            0.32 -    0.55         I         Low            0.56 -    1.40         II        Moderate            1.41 -    3.90         III       High            3.91 -   19.00         IV        Very High           19.01 -  100.00         V         Very High                   >100.00         VI        Very High   Admission on 06/18/2021, Discharged on 06/18/2021  Component Date Value Ref Range Status   Color, Urine 06/18/2021 YELLOW  YELLOW Final   APPearance 06/18/2021 CLEAR  CLEAR Final   Specific Gravity, Urine 06/18/2021 1.034 (H)  1.005 - 1.030 Final   pH 06/18/2021 6.5  5.0 - 8.0 Final   Glucose, UA 06/18/2021 NEGATIVE  NEGATIVE mg/dL Final   Hgb urine dipstick 06/18/2021 LARGE (A)  NEGATIVE Final   Bilirubin Urine 06/18/2021 SMALL (A)  NEGATIVE Final   Ketones, ur 06/18/2021 >80 (A)  NEGATIVE mg/dL Final   Protein, ur 06/18/2021 100 (A)  NEGATIVE mg/dL Final   Nitrite 06/18/2021 NEGATIVE  NEGATIVE Final   Leukocytes,Ua 06/18/2021 NEGATIVE  NEGATIVE Final   RBC / HPF 06/18/2021 6-10  0 - 5 RBC/hpf Final   WBC, UA 06/18/2021 0-5  0 - 5 WBC/hpf Final   Bacteria, UA 06/18/2021 RARE (A)  NONE SEEN Final   Squamous Epithelial / LPF 06/18/2021 0-5  0 - 5 Final   Mucus 06/18/2021 PRESENT   Final   Performed at [facility], [address] [place], [place] [address]  Preg Test, Ur 06/18/2021 NEGATIVE  NEGATIVE Final   Comment:        THE SENSITIVITY OF THIS METHODOLOGY IS >20 mIU/mL. Performed at [facility], [address] [place], [place] [address]    Specimen  Description 06/18/2021    Final                   Value:URINE, CLEAN CATCH Performed at [facility], [address], [place], [place] [address]    Special Requests 06/18/2021    Final                   Value:NONE Performed at [facility], [address], [place], [place] [address]    Culture 06/18/2021 MULTIPLE SPECIES PRESENT, SUGGEST RECOLLECTION (A)   Final   Report Status 06/18/2021 06/19/2021 FINAL   Final   Chlamydia 06/18/2021 Negative   Final   Neisseria Gonorrhea 06/18/2021 Negative   Final   Comment 06/18/2021 Normal Reference Ranger Chlamydia - Negative   Final   Comment 06/18/2021 Normal Reference Range Neisseria Gonorrhea - Negative   Final   Yeast Wet Prep HPF POC 06/18/2021 NONE SEEN  NONE SEEN Final   Trich, Wet Prep 06/18/2021 NONE SEEN  NONE SEEN Final   Clue Cells Wet Prep HPF POC 06/18/2021 NONE SEEN  NONE SEEN Final   WBC, Wet Prep HPF POC 06/18/2021 <10  <10 Final   Sperm 06/18/2021 NONE SEEN   Final   Performed at [facility], [address] [place], [place] [address]  Admission on 06/15/2021, Discharged on 06/15/2021  Component Date Value Ref Range Status   Lipase 06/15/2021 12  11 - 51 U/L Final   Performed at [facility], [address] [place], [place] [address]   Sodium 06/15/2021 139  135 - 145 mmol/L Final   Potassium 06/15/2021 3.7  3.5 - 5.1 mmol/L Final   Chloride 06/15/2021 104  98 - 111 mmol/L Final   CO2 06/15/2021 21 (L)  22 - 32 mmol/L Final   Glucose, Bld 06/15/2021 81  70 - 99 mg/dL Final   Glucose reference range applies only to samples taken after fasting for at least 8 hours.   BUN 06/15/2021 8  6 - 20 mg/dL Final   Creatinine, Ser 06/15/2021 0.78  0.44 - 1.00 mg/dL Final   Calcium 06/15/2021 9.8  8.9 - 10.3 mg/dL Final   Total Protein 06/15/2021 7.7  6.5 - 8.1 g/dL Final   Albumin 06/15/2021 4.3  3.5 - 5.0 g/dL Final   AST 06/15/2021 22  15 - 41 U/L  Final   ALT 06/15/2021 25  0 - 44 U/L Final   Alkaline Phosphatase 06/15/2021 53  38 - 126 U/L Final   Total Bilirubin 06/15/2021 0.8  0.3 - 1.2 mg/dL Final   GFR, Estimated 06/15/2021 >60  >60 mL/min Final   Comment: (NOTE) Calculated using the CKD-EPI Creatinine Equation (2021)    Anion gap 06/15/2021 14  5 - 15 Final   Performed at [facility], [address], [place], [place] [address]   WBC 06/15/2021 8.4  4.0 - 10.5 K/uL Final   RBC 06/15/2021 4.54  3.87 - 5.11 MIL/uL Final   Hemoglobin 06/15/2021 13.7  12.0 - 15.0 g/dL Final   HCT 06/15/2021 42.0  36.0 - 46.0 % Final   MCV 06/15/2021 92.5  80.0 - 100.0 fL Final   MCH 06/15/2021 30.2  26.0 - 34.0 pg Final   MCHC  06/15/2021 32.6  30.0 - 36.0 g/dL Final   RDW 06/15/2021 13.2  11.5 - 15.5 % Final   Platelets 06/15/2021 215  150 - 400 K/uL Final   nRBC 06/15/2021 0.0  0.0 - 0.2 % Final   Performed at [facility], [address] [place], [place] [address]   Glucose-Capillary 06/15/2021 83  70 - 99 mg/dL Final   Glucose reference range applies only to samples taken after fasting for at least 8 hours.  Admission on 06/14/2021, Discharged on 06/15/2021  Component Date Value Ref Range Status   Sodium 06/14/2021 138  135 - 145 mmol/L Final   Potassium 06/14/2021 3.7  3.5 - 5.1 mmol/L Final   Chloride 06/14/2021 104  98 - 111 mmol/L Final   CO2 06/14/2021 20 (L)  22 - 32 mmol/L Final   Glucose, Bld 06/14/2021 77  70 - 99 mg/dL Final   Glucose reference range applies only to samples taken after fasting for at least 8 hours.   BUN 06/14/2021 6  6 - 20 mg/dL Final   Creatinine, Ser 06/14/2021 0.88  0.44 - 1.00 mg/dL Final   Calcium 06/14/2021 9.0  8.9 - 10.3 mg/dL Final   GFR, Estimated 06/14/2021 >60  >60 mL/min Final   Comment: (NOTE) Calculated using the CKD-EPI Creatinine Equation (2021)    Anion gap 06/14/2021 14  5 - 15 Final   Performed at [facility], [place] [address],  [place], [place] [address]   WBC 06/14/2021 8.1  4.0 - 10.5 K/uL Final   RBC 06/14/2021 4.55  3.87 - 5.11 MIL/uL Final   Hemoglobin 06/14/2021 14.1  12.0 - 15.0 g/dL Final   HCT 06/14/2021 43.4  36.0 - 46.0 % Final   MCV 06/14/2021 95.4  80.0 - 100.0 fL Final   MCH 06/14/2021 31.0  26.0 - 34.0 pg Final   MCHC 06/14/2021 32.5  30.0 - 36.0 g/dL Final   RDW 06/14/2021 13.1  11.5 - 15.5 % Final   Platelets 06/14/2021 218  150 - 400 K/uL Final   nRBC 06/14/2021 0.0  0.0 - 0.2 % Final   Performed at [facility] [address], [place], [place] [address]   Troponin I (High Sensitivity) 06/14/2021 <2  <18 ng/L Final   Comment: (NOTE) Elevated high sensitivity troponin I (hsTnI) values and significant  changes across serial measurements may suggest ACS but many other  chronic and acute conditions are known to elevate hsTnI results.  Refer to the "Links" section for chest pain algorithms and additional  guidance. Performed at [facility], [place] [address] [place], [place] [address]    Magnesium 06/14/2021 1.7  1.7 - 2.4 mg/dL Final   Performed at [facility] [address] [place], [place] [address]   TSH 06/14/2021 1.075  0.350 - 4.500 uIU/mL Final   Comment: Performed by a 3rd Generation assay with a functional sensitivity of <=0.01 uIU/mL. Performed at [facility], [place] [address] [place], [place] [address]    Phosphorus 06/14/2021 2.6  2.5 - 4.6 mg/dL Final   Performed at [facility] [address] [place], [place] [address]   Total Protein 06/14/2021 6.9  6.5 - 8.1 g/dL Final   Albumin 06/14/2021 3.4 (L)  3.5 - 5.0 g/dL Final   AST 06/14/2021 31  15 - 41 U/L Final   ALT 06/14/2021 28  0 - 44 U/L Final   Alkaline Phosphatase 06/14/2021 58  38 - 126 U/L Final   Total Bilirubin 06/14/2021 1.3 (H)  0.3 - 1.2 mg/dL Final   Bilirubin, Direct 06/14/2021 0.3 (H)  0.0 - 0.2 mg/dL Final   Indirect Bilirubin 06/14/2021 1.0 (H)  0.3 - 0.9 mg/dL Final   Performed  at [facility] [address], [place], [place] [address]   Lipase 06/14/2021 24  11 - 51 U/L Final   Performed at [facility] [address] [place], [place] [address]   Troponin I (High Sensitivity) 06/15/2021 2  <18 ng/L Final   Comment: (NOTE) Elevated high sensitivity troponin I (hsTnI) values and significant  changes across serial measurements may suggest ACS but many other  chronic and acute conditions are known to elevate hsTnI results.  Refer to the "Links" section for chest pain algorithms and additional  guidance. Performed at [facility], [place] [address], [place], [place] [address]   There may be more visits with results that are not included.    Allergies: Patient has no known allergies.  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 Patient has had a thorough work-up by her Allergist who determined that patient was only allergic to shellfish, most of her reported trigger foods were negative. Allergist also felt that patient's symptoms were more related to her previous dx of anxiety. Patient has also had a negative workup with Pulmonology for sleep study. At this time patient has had significant weight loss and appears to have ruminative thoughts toward foods.  - Admission labs: CBC- CMP- A1c- Lipids, TSH, UA, U preg, UDS EKG: Qtc 447, NSR - Start Zyprexa 2.101m - Start Prozac 181mdaily - Recommend inpatient hosp - Recommend consider PHP/IOP at discharge from inpatient facility      Recommendations  Based on my evaluation the patient does not appear to have an emergency medical condition.    PGY-2 JaFreida BusmanMD 11/30/21  3:35 PM

## 2021-11-30 NOTE — ED Notes (Signed)
Report provided to [facility] at [facility]. To be admitted to room 404-2.

## 2021-11-30 NOTE — Progress Notes (Signed)
Received Carol Schwartz in the assessment room, she was cooperative with the admission process and now waiting for her to urinate. She was relocated to the OBS area and oriented to her new environment. She was offered and given nourishments , but only consumed water before drifting off to sleep. She is sleeping without distress.

## 2021-11-30 NOTE — ED Notes (Signed)
[facility] printed and placed in envelope for transfer. Safe transport called for transportation services to [facility]. Safety maintained and will continue to monitor.

## 2021-11-30 NOTE — ED Notes (Signed)
Carol Schwartz refused dinner, but ate two  fruit cups 4 oz each.

## 2021-11-30 NOTE — ED Provider Notes (Deleted)
Facility Based Crisis Admission H&P  Date: 11/30/21 Patient Name: Carol Schwartz MRN: [id number] Chief Complaint: No chief complaint on file.     Diagnoses:  Final diagnoses:  Severe episode of recurrent major depressive disorder, with psychotic features (HCC)  Panic disorder    HPI: [name] is a 26 yo patient w/ PPH of Anxiety who presents voluntarily with her older brother.   On assessment patient is unkept with mismatch shoes, matted hair, and appears to have a significant weight loss with skin hanging due to the weight loss. Patient reports that she has been "having a lot of health issues" and begins to describe that they started early last year. Patient reports she has not had a job since 07/2020 due to her health issues. Patient reports she suddenly developed pain in her R foot was dx with tendonitis, had sudden numbness and says she was placed on bed rest for 6 mon. Patient reports that she then began having problems in her other foot. Patient reports that her health issues progressed and she began having trouble sleeping and chest pain- specifically palpitations after eating. Patient endorses having a polysomnography that did not dx sleep apnea and reports she then went to a GI doc who dx her with GERD and received an EGD confirming dx. Patient reports she started taking mediation and felt that her palpitations got better, but suddenly she began to feel that she was having allergic relations to food. Patient reports that since October she has been very concerned about what she eats, because she feels that "the world is going to end" and she has palpitations, with GI upset as a result of eating. Patient reports that she has not really eating since last Thursday because she is scared to have these feelings again.   Patient reports that she has also been having multiple episodes a day of crying and irritability. Patient recalls that her husband died 1 mon before her high school graduation  in 2017. Patient reports that it was a sudden heart attack. Patient reports she went on to start college at [employer], but dropped out in her 2nd semester, due feeling out of place and overwhelmed. Patient reports that she continues to feel overwhelmed and reports "every time I try to move forward I can't ever get to a better place in my life." Patient reports she continues to sleep poorly endorsing that she has difficulties falling asleep, anhedonia, irritability, low energy decreased concentration, decreased appetite w/ significant weight loss. Patient denies SI, HI, and AVH. Patient also denies feeling of paranoia towards people but reports "I'm kind of paranoid about food."   Patient reports that she has had multiple panic attacks and endorses calling the ambulance due to the fear that she is going to die. Patient endorses having paplpations, fear of impeneding doom, GI upset amongst her symptoms.  Patient also does not endorse belief in special powers or any hx of episodes concerning for hypomanic or manic episodes. Patient also denies any hx of physical or sexual abuse and does not screen for symptoms of PTSD.  Patient denies any FH of mental health disease. Patient also denies hx of substance use including EtoH.   Collateral- brother Patient's older brother reports that he brought patient in because he concerned. Brother reports that patient lives with their mother and that patient is living in a mess that looks to brother as an outward expression of patient's depression. Brother reports that patient did not handle their father's death very well  and went away to college far from home. Brother reports that patient dropped out of college without telling the family and returned home. When patient came home, patient did tell her brother she was depressed. At the time patient told family she would create new plans, but thus far patient has not been able to adjust. Brother reports that patient has  only further spiraled and been having more health challenges. Brother reports that patient is now wearing the same clothes 3 days at a time, lost a lot of weight and overall he is very concerned.   PHQ 2-9:  [place] [facility] from 11/30/2021 in [facility]  Thoughts that you would be better off dead, or of hurting yourself in some way Not at all  PHQ-9 Total Score 8       [place] ED from 11/30/2021 in [facility] ED from 11/28/2021 in [place] DEPT ED from 08/29/2021 in [place] No Risk No Risk No Risk        Total Time spent with patient: 1 hour  Musculoskeletal  Strength & Muscle Tone: decreased Gait & Station: normal Patient leans: N/A  Psychiatric Specialty Exam  Presentation General Appearance: Disheveled; Bizarre (malodorous, clothes hanging off, hair matted, shoes mismatch, lips dry and cracked)  Eye Contact:Fair  Speech:Clear and Coherent  Speech Volume:Normal  Handedness:Right   Mood and Affect  Mood:Depressed  Affect:Congruent; Depressed   Thought Process  Thought Processes:Coherent  Descriptions of Associations:Circumstantial  Orientation:Full (Time, Place and Person)  Thought Content:Delusions  Diagnosis of Schizophrenia or Schizoaffective disorder in past: No  Duration of Psychotic Symptoms: Greater than six months  Hallucinations:Hallucinations: None  Ideas of Reference:Delusions  Suicidal Thoughts:Suicidal Thoughts: No  Homicidal Thoughts:Homicidal Thoughts: No   Sensorium  Memory:Immediate Good; Recent Good  Judgment:Fair  Insight:Shallow   Executive Functions  Concentration:Fair  Attention Span:Fair  [place] of Knowledge:Good  Language:Good   Psychomotor Activity  Psychomotor Activity:Psychomotor Activity: Mannerisms (sterotypical movements in her  BUE)   Assets  Assets:Communication Skills; Desire for Improvement; Housing; Social Support; Resilience   Sleep  Sleep:Sleep: Poor Number of Hours of Sleep: 8   Nutritional Assessment (For OBS and [facility]BC admissions only) Has the patient had a weight loss or gain of 10 pounds or more in the last 3 months?: -- (Unkown specific weight loss at this time) Has the patient had a decrease in food intake/or appetite?: Yes Does the patient have dental problems?: No Does the patient have eating habits or behaviors that may be indicators of an eating disorder including binging or inducing vomiting?: Yes Has the patient recently lost weight without trying?: 2.0 Has the patient been eating poorly because of a decreased appetite?: 1 Malnutrition Screening Tool Score: 3    Physical Exam HENT:     Head: Normocephalic and atraumatic.  Pulmonary:     Effort: Pulmonary effort is normal.  Skin:    General: Skin is dry.  Neurological:     Mental Status: She is alert and oriented to person, place, and time.   Review of Systems  Constitutional:  Positive for weight loss.  Psychiatric/Behavioral:  Positive for depression and hallucinations. Negative for suicidal ideas. The patient is nervous/anxious and has insomnia.    Blood pressure 125/87, pulse 90, temperature 99 F (37.2 C), temperature source Oral, resp. rate 18, SpO2 98 %. There is no height or weight on file to calculate BMI.  Past Psychiatric History: Dx of Anxiety   Is the patient at risk to self? No  Has the patient been a risk to self in the past 6 months? No .    Has the patient been a risk to self within the distant past? No   Is the patient a risk to others? No   Has the patient been a risk to others in the past 6 months? No   Has the patient been a risk to others within the distant past? No   Past Medical History:  Past Medical History:  Diagnosis Date   Anxiety    GERD (gastroesophageal reflux disease)     Past Surgical  History:  Procedure Laterality Date   LAPAROSCOPY  02/24/2012   Procedure: LAPAROSCOPY OPERATIVE;  Surgeon: Thurnell Lose, MD;  Location: [facility];  Service: Gynecology;  Laterality: N/A;  Drainage of Left Ovarian Cyst   OVARIAN CYST REMOVAL  02/24/2012   Procedure: OVARIAN CYSTECTOMY;  Surgeon: Thurnell Lose, MD;  Location: [facility];  Service: Gynecology;  Laterality: Right;    Family History:  Family History  Problem Relation Age of Onset   Allergic rhinitis Mother    Diabetes Father    Heart attack Father 69   Allergic rhinitis Brother    Hypertension Other     Social History:  Social History   Socioeconomic History   Marital status: Single    Spouse name: Not on file   Number of children: 0   Years of education: Not on file   Highest education level: Not on file  Occupational History   Not on file  Tobacco Use   Smoking status: Never    Passive exposure: Never   Smokeless tobacco: Never  Vaping Use   Vaping Use: Never used  Substance and Sexual Activity   Alcohol use: No   Drug use: No   Sexual activity: Not Currently  Other Topics Concern   Not on file  Social History Narrative   Not on file   Social Determinants of Health   Financial Resource Strain: Not on file  Food Insecurity: Not on file  Transportation Needs: Not on file  Physical Activity: Not on file  Stress: Not on file  Social Connections: Not on file  Intimate Partner Violence: Not on file    SDOH:  SDOH Screenings   Alcohol Screen: Not on file  Depression (PHQ2-9): Medium Risk   PHQ-2 Score: 8  Financial Resource Strain: Not on file  Food Insecurity: Not on file  Housing: Not on file  Physical Activity: Not on file  Social Connections: Not on file  Stress: Not on file  Tobacco Use: Low Risk    Smoking Tobacco Use: Never   Smokeless Tobacco Use: Never   Passive Exposure: Never  Transportation Needs: Not on file    Last Labs:  Telephone on 09/14/2021  Component Date Value Ref  Range Status   F214-IgE Spinach 09/16/2021 <0.10  Class 0 kU/L Final   Red Beet 09/16/2021 <0.10  Class 0 kU/L Final   Comment:     Levels of Specific IgE       Class  Description of Class     ---------------------------  -----  --------------------                    < 0.10         0         Negative  0.10 -    0.31         0/I       Equivocal/Low            0.32 -    0.55         I         Low            0.56 -    1.40         II        Moderate            1.41 -    3.90         III       High            3.91 -   19.00         IV        Very High           19.01 -  100.00         V         Very High                   >100.00         VI        Very High   Telephone on 09/08/2021  Component Date Value Ref Range Status   Class Description Allergens 09/16/2021 Comment   Final   Comment:     Levels of Specific IgE       Class  Description of Class     ---------------------------  -----  --------------------                    < 0.10         0         Negative            0.10 -    0.31         0/I       Equivocal/Low            0.32 -    0.55         I         Low            0.56 -    1.40         II        Moderate            1.41 -    3.90         III       High            3.91 -   19.00         IV        Very High           19.01 -  100.00         V         Very High                   >100.00         VI        Very High    Codfish IgE 09/16/2021 <0.10  Class 0 kU/L Final   F023-IgE Crab 09/16/2021 0.41 (A)  Class I kU/L Final   Shrimp IgE 09/16/2021 0.86 (A)  Class II kU/L Final   Tuna 09/16/2021 <0.10  Class 0 kU/L Final   Allergen Salmon IgE 09/16/2021 <0.10  Class 0 kU/L Final   F080-IgE Lobster 09/16/2021 0.60 (A)  Class II kU/L Final   Catfish 09/16/2021 <0.10  Class 0 kU/L Final  Telephone on 09/07/2021  Component Date Value Ref Range Status   F268-IgE Cloves 09/16/2021 <0.10  Class 0 kU/L Final   Allergen Tomato, IgE 09/16/2021 <0.10  Class 0 kU/L Final    Comment:     Levels of Specific IgE       Class  Description of Class     ---------------------------  -----  --------------------                    < 0.10         0         Negative            0.10 -    0.31         0/I       Equivocal/Low            0.32 -    0.55         I         Low            0.56 -    1.40         II        Moderate            1.41 -    3.90         III       High            3.91 -   19.00         IV        Very High           19.01 -  100.00         V         Very High                   >100.00         VI        Very High    Allergen Coconut IgE 09/16/2021 <0.10  Class 0 kU/L Final   Allergen Green Bean IgE 09/16/2021 <0.10  Class 0 kU/L Final  Admission on 08/29/2021, Discharged on 08/29/2021  Component Date Value Ref Range Status   Color, Urine 08/29/2021 STRAW (A)  YELLOW Final   APPearance 08/29/2021 CLEAR  CLEAR Final   Specific Gravity, Urine 08/29/2021 1.003 (L)  1.005 - 1.030 Final   pH 08/29/2021 8.0  5.0 - 8.0 Final   Glucose, UA 08/29/2021 NEGATIVE  NEGATIVE mg/dL Final   Hgb urine dipstick 08/29/2021 NEGATIVE  NEGATIVE Final   Bilirubin Urine 08/29/2021 NEGATIVE  NEGATIVE Final   Ketones, ur 08/29/2021 5 (A)  NEGATIVE mg/dL Final   Protein, ur 08/29/2021 NEGATIVE  NEGATIVE mg/dL Final   Nitrite 08/29/2021 NEGATIVE  NEGATIVE Final   Leukocytes,Ua 08/29/2021 NEGATIVE  NEGATIVE Final   Performed at [facility], [address] [place], [place] [address]   Preg Test, Ur 08/29/2021 NEGATIVE  NEGATIVE Final   Comment:        THE SENSITIVITY OF THIS METHODOLOGY IS >20 mIU/mL. Performed at [facility], [place] [address], [place], [place] [address]    SARS Coronavirus 2 by RT PCR 08/29/2021 NEGATIVE  NEGATIVE Final  Comment: (NOTE) SARS-CoV-2 target nucleic acids are NOT DETECTED.  The SARS-CoV-2 RNA is generally detectable in upper respiratory specimens during the acute phase of infection. The lowest concentration of SARS-CoV-2 viral  copies this assay can detect is 138 copies/mL. A negative result does not preclude SARS-Cov-2 infection and should not be used as the sole basis for treatment or other patient management decisions. A negative result may occur with  improper specimen collection/handling, submission of specimen other than nasopharyngeal swab, presence of viral mutation(s) within the areas targeted by this assay, and inadequate number of viral copies(<138 copies/mL). A negative result must be combined with clinical observations, patient history, and epidemiological information. The expected result is Negative.  Fact Sheet for Patients:  EntrepreneurPulse.com.au  Fact Sheet for Healthcare Providers:  IncredibleEmployment.be  This test is no                          t yet approved or cleared by the [place] FDA and  has been authorized for detection and/or diagnosis of SARS-CoV-2 by FDA under an Emergency Use Authorization (EUA). This EUA will remain  in effect (meaning this test can be used) for the duration of the COVID-19 declaration under Section 564(b)(1) of the Act, 21 U.S.C.section 360bbb-3(b)(1), unless the authorization is terminated  or revoked sooner.       Influenza A by PCR 08/29/2021 NEGATIVE  NEGATIVE Final   Influenza B by PCR 08/29/2021 NEGATIVE  NEGATIVE Final   Comment: (NOTE) The Xpert Xpress SARS-CoV-2/FLU/RSV plus assay is intended as an aid in the diagnosis of influenza from Nasopharyngeal swab specimens and should not be used as a sole basis for treatment. Nasal washings and aspirates are unacceptable for Xpert Xpress SARS-CoV-2/FLU/RSV testing.  Fact Sheet for Patients: EntrepreneurPulse.com.au  Fact Sheet for Healthcare Providers: IncredibleEmployment.be  This test is not yet approved or cleared by the [place] FDA and has been authorized for detection and/or diagnosis of SARS-CoV-2 by FDA  under an Emergency Use Authorization (EUA). This EUA will remain in effect (meaning this test can be used) for the duration of the COVID-19 declaration under Section 564(b)(1) of the Act, 21 U.S.C. section 360bbb-3(b)(1), unless the authorization is terminated or revoked.  Performed at [facility], [place] [address] [place], [place] [address]   Telephone on 07/16/2021  Component Date Value Ref Range Status   Wheat IgE 07/16/2021 <0.10  Class 0 kU/L Final   Peanut IgE 07/16/2021 <0.10  Class 0 kU/L Final   Comment:     Levels of Specific IgE       Class  Description of Class     ---------------------------  -----  --------------------                    < 0.10         0         Negative            0.10 -    0.31         0/I       Equivocal/Low            0.32 -    0.55         I         Low            0.56 -    1.40         II  Moderate            1.41 -    3.90         III       High            3.91 -   19.00         IV        Very High           19.01 -  100.00         V         Very High                   >100.00         VI        Very High    Allergen Potato, White IgE 07/16/2021 <0.10  Class 0 kU/L Final   Allergen Lentil IgE 07/16/2021 <0.10  Class 0 kU/L Final   Mushroom IgE 07/16/2021 <0.10  Class 0 kU/L Final   Class Description Allergens 07/28/2021 Comment   Final   Comment:     Levels of Specific IgE       Class  Description of Class     ---------------------------  -----  --------------------                    < 0.10         0         Negative            0.10 -    0.31         0/I       Equivocal/Low            0.32 -    0.55         I         Low            0.56 -    1.40         II        Moderate            1.41 -    3.90         III       High            3.91 -   19.00         IV        Very High           19.01 -  100.00         V         Very High                   >100.00         VI        Very High    Penicillium Chrysogen IgE 07/28/2021 <0.10   Class 0 kU/L Final   Cladosporium Herbarum IgE 07/28/2021 <0.10  Class 0 kU/L Final   Aspergillus Fumigatus IgE 07/28/2021 <0.10  Class 0 kU/L Final   Mucor Racemosus IgE 07/28/2021 <0.10  Class 0 kU/L Final   Candida Albicans IgE 07/28/2021 <0.10  Class 0 kU/L Final   Alternaria Alternata IgE 07/28/2021 <0.10  Class 0 kU/L Final   Setomelanomma Rostrat 07/28/2021 <0.10  Class 0 kU/L Final   M009-IgE Fusarium proliferatum 07/28/2021 <0.10  Class 0 kU/L Final   Aureobasidi Pullulans IgE 07/28/2021 <0.10  Class  0 kU/L Final   Phoma Betae IgE 07/28/2021 0.17 (A)  Class 0/I kU/L Final   M014-IgE Epicoccum purpur 07/28/2021 <0.10  Class 0 kU/L Final   Stemphylium Herbarum IgE 07/28/2021 <0.10  Class 0 kU/L Final  Office Visit on 06/19/2021  Component Date Value Ref Range Status   Class Description Allergens 07/16/2021 Comment   Final   Comment:     Levels of Specific IgE       Class  Description of Class     ---------------------------  -----  --------------------                    < 0.10         0         Negative            0.10 -    0.31         0/I       Equivocal/Low            0.32 -    0.55         I         Low            0.56 -    1.40         II        Moderate            1.41 -    3.90         III       High            3.91 -   19.00         IV        Very High           19.01 -  100.00         V         Very High                   >100.00         VI        Very High    IgE (Immunoglobulin E), Serum 07/16/2021 253  6 - 495 IU/mL Final   O215-IgE Alpha-Gal 07/16/2021 <0.10  Class 0 kU/L Final   Beef IgE 07/16/2021 <0.10  Class 0 kU/L Final   Pork IgE 07/16/2021 <0.10  Class 0 kU/L Final   Allergen Lamb IgE 07/16/2021 <0.10  Class 0 kU/L Final   ANA Titer 1 07/16/2021 Negative   Final   Comment:                                      Negative   <1:80                                      Borderline  1:80                                      Positive   >1:80 ICAP nomenclature:  AC-0 For more information about Hep-2 cell patterns use ANApatterns.org, the official website for the International Consensus on Antinuclear Antibody (ANA) Patterns (ICAP).    cu index 07/16/2021 <3.1  <  10 Final   Comment: The CU Index(R) test is the second generation Functional Anti-FceR test.  Patients with a CU Index(R) greater than or equal to 10 have basophil reactive factors in their serum which supports an autoimmune basis for disease. *This test was developed and its performance characteristics determined by [employer]. It has not been cleared or approved by the U.S. Food and Drug Administration.    CRP 07/16/2021 2  0 - 10 mg/L Final   Glucose 07/16/2021 79  70 - 99 mg/dL Final   BUN 07/16/2021 14  6 - 20 mg/dL Final   Creatinine, Ser 07/16/2021 0.85  0.57 - 1.00 mg/dL Final   eGFR 07/16/2021 99  >59 mL/min/1.73 Final   BUN/Creatinine Ratio 07/16/2021 16  9 - 23 Final   Sodium 07/16/2021 141  134 - 144 mmol/L Final   Potassium 07/16/2021 4.4  3.5 - 5.2 mmol/L Final   Chloride 07/16/2021 108 (H)  96 - 106 mmol/L Final   CO2 07/16/2021 20  20 - 29 mmol/L Final   Calcium 07/16/2021 9.1  8.7 - 10.2 mg/dL Final   Total Protein 07/16/2021 6.8  6.0 - 8.5 g/dL Final   Albumin 07/16/2021 4.0  3.9 - 5.0 g/dL Final   Globulin, Total 07/16/2021 2.8  1.5 - 4.5 g/dL Final   Albumin/Globulin Ratio 07/16/2021 1.4  1.2 - 2.2 Final   Bilirubin Total 07/16/2021 0.5  0.0 - 1.2 mg/dL Final   Alkaline Phosphatase 07/16/2021 75  44 - 121 IU/L Final   AST 07/16/2021 44 (H)  0 - 40 IU/L Final   ALT 07/16/2021 46 (H)  0 - 32 IU/L Final   Sed Rate 07/16/2021 32  0 - 32 mm/hr Final   Tryptase 07/16/2021 6.8  2.2 - 13.2 ug/L Final   Class Description Allergens 07/16/2021 Comment   Final   Comment:     Levels of Specific IgE       Class  Description of Class     ---------------------------  -----  --------------------                    < 0.10         0         Negative            0.10 -     0.31         0/I       Equivocal/Low            0.32 -    0.55         I         Low            0.56 -    1.40         II        Moderate            1.41 -    3.90         III       High            3.91 -   19.00         IV        Very High           19.01 -  100.00         V         Very High                   >  100.00         VI        Very High    Milk IgE 07/16/2021 <0.10  Class 0 kU/L Final   Wheat IgE 07/16/2021 <0.10  Class 0 kU/L Final   Allergen Corn, IgE 07/16/2021 <0.10  Class 0 kU/L Final   Peanut IgE 07/16/2021 <0.10  Class 0 kU/L Final   Soybean IgE 07/16/2021 <0.10  Class 0 kU/L Final   Pork IgE 07/16/2021 <0.10  Class 0 kU/L Final   Beef IgE 07/16/2021 <0.10  Class 0 kU/L Final   Food Mix (Seafoods) IgE 07/16/2021 Positive (A)   Final   Comment: Allergens in this mix are:  Dillard's, Whole IgE 07/16/2021 <0.10  Class 0 kU/L Final   Chocolate/Cacao IgE 07/16/2021 <0.10  Class 0 kU/L Final   Allergen Strawberry IgE 07/16/2021 <0.10  Class 0 kU/L Final   Black Bean*, IgE 07/16/2021 <0.35  <0.35 kU/L Final   Class Interpretation 07/16/2021 0   Final   Comment: This conventional EIA uses allergen-coated discs from several suppliers and an enzyme-labeled anti-IgE. CLASS INTERPRETATION: <0.35 kU/L=0, Below Detection; 0.35-0.69 kU/L= 1, Low Positive; 0.70-3.49 kU/L= 2, Moderate Positive; 3.50-17.49 kU/L= 3, Positive; 17.50-49.99 kU/L= 4, Strong Positive; 50.00-99.99 kU/L= 5, Very Strong Positive; >99.99 kU/L= 6, Very Strong Positive    Allergen Lettuce IgE 07/16/2021 <0.10  Class 0 kU/L Final   Allergen Ginger IgE 07/16/2021 <0.10  Class 0 kU/L Final   Class Description Allergens 07/16/2021 Comment   Final   Comment:     Levels of Specific IgE       Class  Description of Class     ---------------------------  -----  --------------------                    < 0.10         0         Negative            0.10 -    0.31         0/I        Equivocal/Low            0.32 -    0.55         I         Low            0.56 -    1.40         II        Moderate            1.41 -    3.90         III       High            3.91 -   19.00         IV        Very High           19.01 -  100.00         V         Very High                   >100.00         VI        Very High    IgE (Immunoglobulin E), Serum 07/16/2021 289  6 - 495 IU/mL Final  D Pteronyssinus IgE 07/16/2021 0.89 (A)  Class II kU/L Final   D Farinae IgE 07/16/2021 1.45 (A)  Class III kU/L Final   E001-IgE Cat Dander 07/16/2021 <0.10  Class 0 kU/L Final   E005-IgE Dog Dander 07/16/2021 <0.10  Class 0 kU/L Final   [place] Grass IgE 07/16/2021 <0.10  Class 0 kU/L Final   Timothy Grass IgE 07/16/2021 <0.10  Class 0 kU/L Final   Johnson Grass IgE 07/16/2021 <0.10  Class 0 kU/L Final   Cockroach, German IgE 07/16/2021 3.55 (A)  Class III kU/L Final   Penicillium Chrysogen IgE 07/16/2021 <0.10  Class 0 kU/L Final   Cladosporium Herbarum IgE 07/16/2021 <0.10  Class 0 kU/L Final   Aspergillus Fumigatus IgE 07/16/2021 <0.10  Class 0 kU/L Final   Alternaria Alternata IgE 07/16/2021 <0.10  Class 0 kU/L Final   Maple/Box Elder IgE 07/16/2021 <0.10  Class 0 kU/L Final   Common Silver Wendee Copp IgE 07/16/2021 <0.10  Class 0 kU/L Final   Cedar, [place] IgE 07/16/2021 0.45 (A)  Class I kU/L Final   Oak, White IgE 07/16/2021 0.11 (A)  Class 0/I kU/L Final   Elm, American IgE 07/16/2021 <0.10  Class 0 kU/L Final   Cottonwood IgE 07/16/2021 <0.10  Class 0 kU/L Final   Pecan, Hickory IgE 07/16/2021 0.20 (A)  Class 0/I kU/L Final   White Mulberry IgE 07/16/2021 <0.10  Class 0 kU/L Final   Ragweed, Short IgE 07/16/2021 0.19 (A)  Class 0/I kU/L Final   Pigweed, Rough IgE 07/16/2021 0.11 (A)  Class 0/I kU/L Final   Sheep Sorrel IgE Qn 07/16/2021 <0.10  Class 0 kU/L Final   Mouse Urine IgE 07/16/2021 <0.10  Class 0 kU/L Final   [id number] Chick Pea 07/16/2021 <0.10  Class 0 kU/L Final   Mint IgE  07/16/2021 <0.10  Class 0 kU/L Final   Allergen Black Pepper IgE 07/16/2021 <0.10  Class 0 kU/L Final   Comment:     Levels of Specific IgE       Class  Description of Class     ---------------------------  -----  --------------------                    < 0.10         0         Negative            0.10 -    0.31         0/I       Equivocal/Low            0.32 -    0.55         I         Low            0.56 -    1.40         II        Moderate            1.41 -    3.90         III       High            3.91 -   19.00         IV        Very High           19.01 -  100.00         V  Very High                   >100.00         VI        Very High   Admission on 06/18/2021, Discharged on 06/18/2021  Component Date Value Ref Range Status   Color, Urine 06/18/2021 YELLOW  YELLOW Final   APPearance 06/18/2021 CLEAR  CLEAR Final   Specific Gravity, Urine 06/18/2021 1.034 (H)  1.005 - 1.030 Final   pH 06/18/2021 6.5  5.0 - 8.0 Final   Glucose, UA 06/18/2021 NEGATIVE  NEGATIVE mg/dL Final   Hgb urine dipstick 06/18/2021 LARGE (A)  NEGATIVE Final   Bilirubin Urine 06/18/2021 SMALL (A)  NEGATIVE Final   Ketones, ur 06/18/2021 >80 (A)  NEGATIVE mg/dL Final   Protein, ur 06/18/2021 100 (A)  NEGATIVE mg/dL Final   Nitrite 06/18/2021 NEGATIVE  NEGATIVE Final   Leukocytes,Ua 06/18/2021 NEGATIVE  NEGATIVE Final   RBC / HPF 06/18/2021 6-10  0 - 5 RBC/hpf Final   WBC, UA 06/18/2021 0-5  0 - 5 WBC/hpf Final   Bacteria, UA 06/18/2021 RARE (A)  NONE SEEN Final   Squamous Epithelial / LPF 06/18/2021 0-5  0 - 5 Final   Mucus 06/18/2021 PRESENT   Final   Performed at [facility], [address], [place], [place] [address]   Preg Test, Ur 06/18/2021 NEGATIVE  NEGATIVE Final   Comment:        THE SENSITIVITY OF THIS METHODOLOGY IS >20 mIU/mL. Performed at [facility], [address], [place], [place] [address]    Specimen Description 06/18/2021    Final                    Value:URINE, CLEAN CATCH Performed at [facility], [address], [place], [place] [address]    Special Requests 06/18/2021    Final                   Value:NONE Performed at [facility], [address], [place], [place] [address]    Culture 06/18/2021 MULTIPLE SPECIES PRESENT, SUGGEST RECOLLECTION (A)   Final   Report Status 06/18/2021 06/19/2021 FINAL   Final   Chlamydia 06/18/2021 Negative   Final   Neisseria Gonorrhea 06/18/2021 Negative   Final   Comment 06/18/2021 Normal Reference Ranger Chlamydia - Negative   Final   Comment 06/18/2021 Normal Reference Range Neisseria Gonorrhea - Negative   Final   Yeast Wet Prep HPF POC 06/18/2021 NONE SEEN  NONE SEEN Final   Trich, Wet Prep 06/18/2021 NONE SEEN  NONE SEEN Final   Clue Cells Wet Prep HPF POC 06/18/2021 NONE SEEN  NONE SEEN Final   WBC, Wet Prep HPF POC 06/18/2021 <10  <10 Final   Sperm 06/18/2021 NONE SEEN   Final   Performed at [facility], [address] [place], [place] [address]  Admission on 06/15/2021, Discharged on 06/15/2021  Component Date Value Ref Range Status   Lipase 06/15/2021 12  11 - 51 U/L Final   Performed at [facility], [address], [place], [place] [address]   Sodium 06/15/2021 139  135 - 145 mmol/L Final   Potassium 06/15/2021 3.7  3.5 - 5.1 mmol/L Final   Chloride 06/15/2021 104  98 - 111 mmol/L Final   CO2 06/15/2021 21 (L)  22 - 32 mmol/L Final   Glucose, Bld 06/15/2021 81  70 - 99 mg/dL Final  Glucose reference range applies only to samples taken after fasting for at least 8 hours.   BUN 06/15/2021 8  6 - 20 mg/dL Final   Creatinine, Ser 06/15/2021 0.78  0.44 - 1.00 mg/dL Final   Calcium 06/15/2021 9.8  8.9 - 10.3 mg/dL Final   Total Protein 06/15/2021 7.7  6.5 - 8.1 g/dL Final   Albumin 06/15/2021 4.3  3.5 - 5.0 g/dL Final   AST 06/15/2021 22  15 - 41 U/L Final   ALT 06/15/2021 25  0 - 44 U/L  Final   Alkaline Phosphatase 06/15/2021 53  38 - 126 U/L Final   Total Bilirubin 06/15/2021 0.8  0.3 - 1.2 mg/dL Final   GFR, Estimated 06/15/2021 >60  >60 mL/min Final   Comment: (NOTE) Calculated using the CKD-EPI Creatinine Equation (2021)    Anion gap 06/15/2021 14  5 - 15 Final   Performed at [facility], [address] [place], [place] [address]   WBC 06/15/2021 8.4  4.0 - 10.5 K/uL Final   RBC 06/15/2021 4.54  3.87 - 5.11 MIL/uL Final   Hemoglobin 06/15/2021 13.7  12.0 - 15.0 g/dL Final   HCT 06/15/2021 42.0  36.0 - 46.0 % Final   MCV 06/15/2021 92.5  80.0 - 100.0 fL Final   MCH 06/15/2021 30.2  26.0 - 34.0 pg Final   MCHC 06/15/2021 32.6  30.0 - 36.0 g/dL Final   RDW 06/15/2021 13.2  11.5 - 15.5 % Final   Platelets 06/15/2021 215  150 - 400 K/uL Final   nRBC 06/15/2021 0.0  0.0 - 0.2 % Final   Performed at [facility], [address] [place], [place] [address]   Glucose-Capillary 06/15/2021 83  70 - 99 mg/dL Final   Glucose reference range applies only to samples taken after fasting for at least 8 hours.  Admission on 06/14/2021, Discharged on 06/15/2021  Component Date Value Ref Range Status   Sodium 06/14/2021 138  135 - 145 mmol/L Final   Potassium 06/14/2021 3.7  3.5 - 5.1 mmol/L Final   Chloride 06/14/2021 104  98 - 111 mmol/L Final   CO2 06/14/2021 20 (L)  22 - 32 mmol/L Final   Glucose, Bld 06/14/2021 77  70 - 99 mg/dL Final   Glucose reference range applies only to samples taken after fasting for at least 8 hours.   BUN 06/14/2021 6  6 - 20 mg/dL Final   Creatinine, Ser 06/14/2021 0.88  0.44 - 1.00 mg/dL Final   Calcium 06/14/2021 9.0  8.9 - 10.3 mg/dL Final   GFR, Estimated 06/14/2021 >60  >60 mL/min Final   Comment: (NOTE) Calculated using the CKD-EPI Creatinine Equation (2021)    Anion gap 06/14/2021 14  5 - 15 Final   Performed at [facility], [place] [address] [place], [place] [address]   WBC 06/14/2021 8.1   4.0 - 10.5 K/uL Final   RBC 06/14/2021 4.55  3.87 - 5.11 MIL/uL Final   Hemoglobin 06/14/2021 14.1  12.0 - 15.0 g/dL Final   HCT 06/14/2021 43.4  36.0 - 46.0 % Final   MCV 06/14/2021 95.4  80.0 - 100.0 fL Final   MCH 06/14/2021 31.0  26.0 - 34.0 pg Final   MCHC 06/14/2021 32.5  30.0 - 36.0 g/dL Final   RDW 06/14/2021 13.1  11.5 - 15.5 % Final   Platelets 06/14/2021 218  150 - 400 K/uL Final   nRBC 06/14/2021 0.0  0.0 - 0.2 % Final  Performed at [facility], [place] [address], [place], [place] [address]   Troponin I (High Sensitivity) 06/14/2021 <2  <18 ng/L Final   Comment: (NOTE) Elevated high sensitivity troponin I (hsTnI) values and significant  changes across serial measurements may suggest ACS but many other  chronic and acute conditions are known to elevate hsTnI results.  Refer to the "Links" section for chest pain algorithms and additional  guidance. Performed at [facility], [place] [address], [place], [place] [address]    Magnesium 06/14/2021 1.7  1.7 - 2.4 mg/dL Final   Performed at [facility] [address] [place], [place] [address]   TSH 06/14/2021 1.075  0.350 - 4.500 uIU/mL Final   Comment: Performed by a 3rd Generation assay with a functional sensitivity of <=0.01 uIU/mL. Performed at [facility], [place] [address], [place], [place] [address]    Phosphorus 06/14/2021 2.6  2.5 - 4.6 mg/dL Final   Performed at [facility] [address] [place], [place] [address]   Total Protein 06/14/2021 6.9  6.5 - 8.1 g/dL Final   Albumin 06/14/2021 3.4 (L)  3.5 - 5.0 g/dL Final   AST 06/14/2021 31  15 - 41 U/L Final   ALT 06/14/2021 28  0 - 44 U/L Final   Alkaline Phosphatase 06/14/2021 58  38 - 126 U/L Final   Total Bilirubin 06/14/2021 1.3 (H)  0.3 - 1.2 mg/dL Final   Bilirubin, Direct 06/14/2021 0.3 (H)  0.0 - 0.2 mg/dL Final   Indirect Bilirubin 06/14/2021 1.0 (H)  0.3 - 0.9 mg/dL Final   Performed at [facility] [address],  [place], [place] [address]   Lipase 06/14/2021 24  11 - 51 U/L Final   Performed at [facility] [address], [place], [place] [address]   Troponin I (High Sensitivity) 06/15/2021 2  <18 ng/L Final   Comment: (NOTE) Elevated high sensitivity troponin I (hsTnI) values and significant  changes across serial measurements may suggest ACS but many other  chronic and acute conditions are known to elevate hsTnI results.  Refer to the "Links" section for chest pain algorithms and additional  guidance. Performed at [facility], [place] [address], [place], [place] [address]   Admission on 06/13/2021, Discharged on 06/13/2021  Component Date Value Ref Range Status   Sodium 06/13/2021 138  135 - 145 mmol/L Final   Potassium 06/13/2021 4.0  3.5 - 5.1 mmol/L Final   Chloride 06/13/2021 105  98 - 111 mmol/L Final   CO2 06/13/2021 25  22 - 32 mmol/L Final   Glucose, Bld 06/13/2021 88  70 - 99 mg/dL Final   Glucose reference range applies only to samples taken after fasting for at least 8 hours.   BUN 06/13/2021 7  6 - 20 mg/dL Final   Creatinine, Ser 06/13/2021 0.92  0.44 - 1.00 mg/dL Final   Calcium 06/13/2021 9.0  8.9 - 10.3 mg/dL Final   Total Protein 06/13/2021 7.1  6.5 - 8.1 g/dL Final   Albumin 06/13/2021 3.4 (L)  3.5 - 5.0 g/dL Final   AST 06/13/2021 25  15 - 41 U/L Final   ALT 06/13/2021 26  0 - 44 U/L Final   Alkaline Phosphatase 06/13/2021 67  38 - 126 U/L Final   Total Bilirubin 06/13/2021 0.7  0.3 - 1.2 mg/dL Final   GFR, Estimated 06/13/2021 >60  >60 mL/min Final   Comment: (NOTE)  Calculated using the CKD-EPI Creatinine Equation (2021)    Anion gap 06/13/2021 8  5 - 15 Final   Performed at [facility], [place] [address] [place], [place] [address]   Lipase 06/13/2021 25  11 - 51 U/L Final   Performed at [facility] [address] [place], [place] [address]   WBC 06/13/2021 8.8  4.0 - 10.5 K/uL Final   RBC 06/13/2021 4.80  3.87 - 5.11 MIL/uL Final   Hemoglobin  06/13/2021 14.7  12.0 - 15.0 g/dL Final   HCT 06/13/2021 45.7  36.0 - 46.0 % Final   MCV 06/13/2021 95.2  80.0 - 100.0 fL Final   MCH 06/13/2021 30.6  26.0 - 34.0 pg Final   MCHC 06/13/2021 32.2  30.0 - 36.0 g/dL Final   RDW 06/13/2021 13.0  11.5 - 15.5 % Final   Platelets 06/13/2021 231  150 - 400 K/uL Final   nRBC 06/13/2021 0.0  0.0 - 0.2 % Final   Neutrophils Relative % 06/13/2021 78  % Final   Neutro Abs 06/13/2021 6.8  1.7 - 7.7 K/uL Final   Lymphocytes Relative 06/13/2021 16  % Final   Lymphs Abs 06/13/2021 1.4  0.7 - 4.0 K/uL Final   Monocytes Relative 06/13/2021 5  % Final   Monocytes Absolute 06/13/2021 0.5  0.1 - 1.0 K/uL Final   Eosinophils Relative 06/13/2021 0  % Final   Eosinophils Absolute 06/13/2021 0.0  0.0 - 0.5 K/uL Final   Basophils Relative 06/13/2021 0  % Final   Basophils Absolute 06/13/2021 0.0  0.0 - 0.1 K/uL Final   Immature Granulocytes 06/13/2021 1  % Final   Abs Immature Granulocytes 06/13/2021 0.04  0.00 - 0.07 K/uL Final   Performed at [facility], [place] [address],  [place], [place] [address]   I-stat hCG, quantitative 06/13/2021 <5.0  <5 mIU/mL Final   Comment 3 06/13/2021          Final   Comment:   GEST. AGE      CONC.  (mIU/mL)   <=1 WEEK        5 - 50     2 WEEKS       50 - 500     3 WEEKS       100 - 10,000     4 WEEKS     1,000 - 30,000        FEMALE AND NON-PREGNANT FEMALE:     LESS THAN 5 mIU/mL    Troponin I (High Sensitivity) 06/13/2021 <2  <18 ng/L Final   Comment: (NOTE) Elevated high sensitivity troponin I (hsTnI) values and significant  changes across serial measurements may suggest ACS but many other  chronic and acute conditions are known to elevate hsTnI results.  Refer to the "Links" section for chest pain algorithms and additional  guidance. Performed at [facility], [place] [address], [place], [place] [address]    Troponin I (High Sensitivity) 06/13/2021 <2  <18 ng/L Final   Comment: (NOTE) Elevated high  sensitivity troponin I (hsTnI) values and significant  changes across serial measurements may suggest ACS but many other  chronic and acute conditions are known to elevate hsTnI results.  Refer to the "Links" section for chest pain algorithms and additional  guidance. Performed at [facility], [place] [address], [place], [place] [address]   There may be more visits with results that are not included.    Allergies: Patient  has no known allergies.  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 Patient has had a thorough work-up by her Allergist who determined that patient was only allergic to shellfish, most of her reported trigger foods were negative. Allergist also felt that patient's symptoms were more related to her previous dx of anxiety. Patient has also had a negative workup with Pulmonology for sleep study. At this time patient has had significant weight loss and appears to have ruminative thoughts toward foods.  - Admission labs: CBC- CMP- A1c- Lipids, TSH, UA, U preg, UDS EKG: Qtc 447, NSR - Start Zyprexa 2.58m - Start Prozac 176mdaily - Recommend inpatient hosp - Recommend consider PHP/IOP at discharge from inpatient facility  Recommendations  Based on my evaluation the patient does not appear to have an emergency medical condition.   PGY-2 JaFreida BusmanMD 11/30/21  9:51 AM

## 2021-11-30 NOTE — Plan of Care (Signed)
Nurse discussed anxiety, depression and coping skills with patient.  

## 2021-11-30 NOTE — Tx Team (Signed)
Initial Treatment Plan 11/30/2021 7:07 PM Carol Schwartz ZOX:[id number]    PATIENT STRESSORS: Financial difficulties   Health problems   Traumatic event     PATIENT STRENGTHS: Communication skills  General fund of knowledge  Motivation for treatment/growth  Supportive family/friends    PATIENT IDENTIFIED PROBLEMS: "Anxiety"  "Depression"  "Panic"  "Eating disorder"               DISCHARGE CRITERIA:  Ability to meet basic life and health needs Adequate post-discharge living arrangements Improved stabilization in mood, thinking, and/or behavior Medical problems require only outpatient monitoring Motivation to continue treatment in a less acute level of care Need for constant or close observation no longer present Reduction of life-threatening or endangering symptoms to within safe limits Safe-care adequate arrangements made Verbal commitment to aftercare and medication compliance  PRELIMINARY DISCHARGE PLAN: Attend aftercare/continuing care group Attend PHP/IOP Outpatient therapy Return to previous living arrangement  PATIENT/FAMILY INVOLVEMENT: This treatment plan has been presented to and reviewed with the patient, Carol Schwartz.   The patient and family have been given the opportunity to ask questions and make suggestions.  Quintella Reichert [place], [place] 11/30/2021, 7:07 PM

## 2021-11-30 NOTE — ED Notes (Signed)
[facility] arrived to take her to [facility], she received her personal belongings and transported without incident. The necessary paperwork was transported with her.

## 2021-12-01 ENCOUNTER — Telehealth (HOSPITAL_COMMUNITY): Payer: Self-pay | Admitting: *Deleted

## 2021-12-01 DIAGNOSIS — F333 Major depressive disorder, recurrent, severe with psychotic symptoms: Secondary | ICD-10-CM

## 2021-12-01 DIAGNOSIS — F411 Generalized anxiety disorder: Secondary | ICD-10-CM

## 2021-12-01 MED ORDER — ARIPIPRAZOLE 5 MG PO TABS
5.0000 mg | ORAL_TABLET | Freq: Every day | ORAL | Status: AC
  Administered 2021-12-01: 5 mg via ORAL
  Filled 2021-12-01: qty 1

## 2021-12-01 MED ORDER — ARIPIPRAZOLE 15 MG PO TABS
7.5000 mg | ORAL_TABLET | Freq: Every day | ORAL | Status: AC
  Administered 2021-12-02: 7.5 mg via ORAL
  Filled 2021-12-01: qty 1

## 2021-12-01 MED ORDER — ARIPIPRAZOLE 10 MG PO TABS
10.0000 mg | ORAL_TABLET | Freq: Every day | ORAL | Status: DC
  Administered 2021-12-03 – 2021-12-08 (×6): 10 mg via ORAL
  Filled 2021-12-01 (×8): qty 1

## 2021-12-01 MED ORDER — FLUOXETINE HCL 20 MG PO CAPS
20.0000 mg | ORAL_CAPSULE | Freq: Every day | ORAL | Status: DC
  Administered 2021-12-02 – 2021-12-03 (×2): 20 mg via ORAL
  Filled 2021-12-01 (×5): qty 1

## 2021-12-01 MED ORDER — TRAZODONE HCL 50 MG PO TABS: 50.0000 mg | ORAL_TABLET | Freq: Every evening | ORAL | Status: DC | PRN

## 2021-12-01 NOTE — H&P (Addendum)
Psychiatric Admission Assessment Adult  Patient Identification: Carol Schwartz MRN:  [id number] Date of Evaluation:  12/01/2021 Chief Complaint:  Major depressive disorder with psychotic features (HCC) [F32.3] Principal Diagnosis: MDD (major depressive disorder), recurrent, severe, with psychosis (HCC) Diagnosis:  Principal Problem:   MDD (major depressive disorder), recurrent, severe, with psychosis (HCC) Active Problems:   GAD (generalized anxiety disorder)  History of Present Illness:  Patient is a 26 year old female who denies having any previously diagnosed psychiatric history, who was admitted to the psychiatric unit for evaluation of extreme anxiety and paranoia.  Patient was medically cleared at [facility].   Prior to admission psychiatric medications: No home psychiatric medications  Patient reports for about 1 year, she has had bodily symptoms that she cannot explain, and that are odd and bizarre to her.  She reports that 1 year ago, her right foot was still up, was seen by podiatrist was recommended to be on bedrest, and then left foot started to be stiff as well.  She reports being on bed rest caused her to be depressed.  She reports that at some point during this time she also started to have GI symptoms, that she could not explain.  She reports believing she has allergies to certain foods, has seen an allergist for work-up, which has been negative thus far.  She reports she also has palpitations and panic attack like symptoms, after she ingests certain foods, but is unable to narrow down which foods these are specifically.  She also reports having panic attacks or sleep. Per nursing, patient was up all night, taking showers constantly, believing that she is being exposed to allergens in the air and is having allergic reactions to these while on the unit.  On assessment today, patient reports she is been very depressed for some time, at least 1-1.5 years.  She reports anhedonia.  She reports  sleep is okay, getting 9 to 10 hours per night, without difficulty initiating or maintaining sleep.  Reports energy is okay when her sleep is adequate.  Reports appetite is okay.  Reports some weight loss.  Concentration is impaired during the interview.  Denies any suicidal thoughts.  Denies homicidal thoughts. She reports anxiety is elevated, excessive, and focused on her health concerns.  Patient does have insight into that her anxiety could be causing her bodily symptoms, and that the bodily symptoms are causing her to feel anxious. She does describe feeling "paranoid" about the symptoms she is experiencing in her body, that cannot be explained by any other medical cause thus far.  She reports feeling hypervigilant and on edge due to not having a medical explanation for the symptoms. Denies auditory hallucinations, visual hallucinations, thought insertion, thought control, or ideas of reference. She denies having symptoms meeting criteria for hypomania or mania episode at this time or in the past. Denies history of trauma.  Denies history of abuse.  Patient does report having multiple somatic symptoms that are distressing and resulted in significant disruption of daily life.  She reports having excessive thoughts, feelings, and behaviors related to the somatic symptoms, as manifested by: Disproportionate and persistent thoughts about the seriousness of 1 symptoms, persistent high level of anxiety about health or symptoms, excessive time and energy devoted to the symptoms or health concerns.  Past psychiatric history: She denies a previous psychiatric diagnosis.  Denies ever seeing a psychiatrist before (except at [facility]).  Denies ever being prescribed psychiatric medications before this admission.  Patient reports she was started on Prozac yesterday.  Denies any previous psychiatric hospitalization.  Denies any previous suicide attempt.  Past medical history: GERD, gastritis, dysmenorrhea LMP: October  2020, patient sees OB/GYN as prescribed OCP for this Surgeries: 2013 for ovarian cyst Denies history of seizures Allergies: NKDA  Substance use: Denies alcohol use, tobacco use, nicotine use, marijuana use, other illicit drug use  Family history: Psychiatric illness: Denies.  Family history of suicide attempt: Denies.  Social history: Born in [place], moved to the local area around seventh grade.  He was in the IB program at high school.  Graduated high school.  Attended [employer] for 1 year. Single.  No children.    Total Time spent with patient: 45 minutes   Is the patient at risk to self? No.  Has the patient been a risk to self in the past 6 months? No.  Has the patient been a risk to self within the distant past? No.  Is the patient a risk to others? No.  Has the patient been a risk to others in the past 6 months? No.  Has the patient been a risk to others within the distant past? No.   Prior Inpatient Therapy:   Prior Outpatient Therapy:    Alcohol Screening: Patient refused Alcohol Screening Tool: Yes Substance Abuse History in the last 12 months:  No. Consequences of Substance Abuse: NA Previous Psychotropic Medications: No  Psychological Evaluations: Yes  Past Medical History:  Past Medical History:  Diagnosis Date   Anxiety    GERD (gastroesophageal reflux disease)     Past Surgical History:  Procedure Laterality Date   LAPAROSCOPY  02/24/2012   Procedure: LAPAROSCOPY OPERATIVE;  Surgeon: Geryl Rankins, MD;  Location: [facility];  Service: Gynecology;  Laterality: N/A;  Drainage of Left Ovarian Cyst   OVARIAN CYST REMOVAL  02/24/2012   Procedure: OVARIAN CYSTECTOMY;  Surgeon: Geryl Rankins, MD;  Location: [facility];  Service: Gynecology;  Laterality: Right;   Family History:  Family History  Problem Relation Age of Onset   Allergic rhinitis Mother    Diabetes Father    Heart attack Father 26   Allergic rhinitis Brother    Hypertension Other      Tobacco Screening:   denies  Social History:  Social History   Substance and Sexual Activity  Alcohol Use No     Social History   Substance and Sexual Activity  Drug Use No    Additional Social History:                           Allergies:   Allergies  Allergen Reactions   Shellfish Allergy    Lab Results:  Results for orders placed or performed during the hospital encounter of 11/30/21 (from the past 48 hour(s))  Resp Panel by RT-PCR (Flu A&B, Covid) Nasopharyngeal Swab     Status: None   Collection Time: 11/30/21  9:49 AM   Specimen: Nasopharyngeal Swab; Nasopharyngeal(NP) swabs in vial transport medium  Result Value Ref Range   SARS Coronavirus 2 by RT PCR NEGATIVE NEGATIVE    Comment: (NOTE) SARS-CoV-2 target nucleic acids are NOT DETECTED.  The SARS-CoV-2 RNA is generally detectable in upper respiratory specimens during the acute phase of infection. The lowest concentration of SARS-CoV-2 viral copies this assay can detect is 138 copies/mL. A negative result does not preclude SARS-Cov-2 infection and should not be used as the sole basis for treatment or other patient management decisions. A negative result may occur  with  improper specimen collection/handling, submission of specimen other than nasopharyngeal swab, presence of viral mutation(s) within the areas targeted by this assay, and inadequate number of viral copies(<138 copies/mL). A negative result must be combined with clinical observations, patient history, and epidemiological information. The expected result is Negative.  Fact Sheet for Patients:  BloggerCourse.com  Fact Sheet for Healthcare Providers:  SeriousBroker.it  This test is no t yet approved or cleared by the [place] FDA and  has been authorized for detection and/or diagnosis of SARS-CoV-2 by FDA under an Emergency Use Authorization (EUA). This EUA will remain  in  effect (meaning this test can be used) for the duration of the COVID-19 declaration under Section 564(b)(1) of the Act, 21 U.S.C.section 360bbb-3(b)(1), unless the authorization is terminated  or revoked sooner.       Influenza A by PCR NEGATIVE NEGATIVE   Influenza B by PCR NEGATIVE NEGATIVE    Comment: (NOTE) The Xpert Xpress SARS-CoV-2/FLU/RSV plus assay is intended as an aid in the diagnosis of influenza from Nasopharyngeal swab specimens and should not be used as a sole basis for treatment. Nasal washings and aspirates are unacceptable for Xpert Xpress SARS-CoV-2/FLU/RSV testing.  Fact Sheet for Patients: BloggerCourse.com  Fact Sheet for Healthcare Providers: SeriousBroker.it  This test is not yet approved or cleared by the [place] FDA and has been authorized for detection and/or diagnosis of SARS-CoV-2 by FDA under an Emergency Use Authorization (EUA). This EUA will remain in effect (meaning this test can be used) for the duration of the COVID-19 declaration under Section 564(b)(1) of the Act, 21 U.S.C. section 360bbb-3(b)(1), unless the authorization is terminated or revoked.  Performed at [facility], [address] [place], [place] [address]   CBC with Differential/Platelet     Status: Abnormal   Collection Time: 11/30/21  9:50 AM  Result Value Ref Range   WBC 7.0 4.0 - 10.5 K/uL   RBC 4.71 3.87 - 5.11 MIL/uL   Hemoglobin 15.1 (H) 12.0 - 15.0 g/dL   HCT 19.1 47.8 - 29.5 %   MCV 95.3 80.0 - 100.0 fL   MCH 32.1 26.0 - 34.0 pg   MCHC 33.6 30.0 - 36.0 g/dL   RDW 62.1 30.8 - 65.7 %   Platelets 214 150 - 400 K/uL   nRBC 0.0 0.0 - 0.2 %   Neutrophils Relative % 61 %   Neutro Abs 4.2 1.7 - 7.7 K/uL   Lymphocytes Relative 29 %   Lymphs Abs 2.0 0.7 - 4.0 K/uL   Monocytes Relative 9 %   Monocytes Absolute 0.6 0.1 - 1.0 K/uL   Eosinophils Relative 1 %   Eosinophils Absolute 0.1 0.0 - 0.5 K/uL    Basophils Relative 0 %   Basophils Absolute 0.0 0.0 - 0.1 K/uL   Immature Granulocytes 0 %   Abs Immature Granulocytes 0.01 0.00 - 0.07 K/uL    Comment: Performed at [facility], [address], [place], [place] [address]  Comprehensive metabolic panel     Status: Abnormal   Collection Time: 11/30/21  9:50 AM  Result Value Ref Range   Sodium 140 135 - 145 mmol/L   Potassium 3.7 3.5 - 5.1 mmol/L   Chloride 105 98 - 111 mmol/L   CO2 24 22 - 32 mmol/L   Glucose, Bld 91 70 - 99 mg/dL    Comment: Glucose reference range applies only to samples taken after fasting for at least 8 hours.   BUN  6 6 - 20 mg/dL   Creatinine, Ser 1.61 0.44 - 1.00 mg/dL   Calcium 9.8 8.9 - 09.6 mg/dL   Total Protein 7.4 6.5 - 8.1 g/dL   Albumin 3.9 3.5 - 5.0 g/dL   AST 26 15 - 41 U/L   ALT 27 0 - 44 U/L   Alkaline Phosphatase 58 38 - 126 U/L   Total Bilirubin 1.9 (H) 0.3 - 1.2 mg/dL   GFR, Estimated >04 >54 mL/min    Comment: (NOTE) Calculated using the CKD-EPI Creatinine Equation (2021)    Anion gap 11 5 - 15    Comment: Performed at [facility], [address], [place], [place] [address]  Hemoglobin A1c     Status: Abnormal   Collection Time: 11/30/21  9:50 AM  Result Value Ref Range   Hgb A1c MFr Bld 4.7 (L) 4.8 - 5.6 %    Comment: (NOTE) Pre diabetes:          5.7%-6.4%  Diabetes:              >6.4%  Glycemic control for   <7.0% adults with diabetes    Mean Plasma Glucose 88.19 mg/dL    Comment: Performed at [facility], [address] [place], [place] [address]  Ethanol     Status: None   Collection Time: 11/30/21  9:50 AM  Result Value Ref Range   Alcohol, Ethyl (B) <10 <10 mg/dL    Comment: (NOTE) Lowest detectable limit for serum alcohol is 10 mg/dL.  For medical purposes only. Performed at [facility], [address], [place], [place] [address]   TSH     Status: None   Collection Time: 11/30/21  9:50 AM  Result Value Ref Range   TSH 3.855 0.350 - 4.500  uIU/mL    Comment: Performed by a 3rd Generation assay with a functional sensitivity of <=0.01 uIU/mL. Performed at [facility], [address], [place], [place] [address]   Lipid panel     Status: Abnormal   Collection Time: 11/30/21  9:50 AM  Result Value Ref Range   Cholesterol 160 0 - 200 mg/dL   Triglycerides 44 <578 mg/dL   HDL 26 (L) >46 mg/dL   Total CHOL/HDL Ratio 6.2 RATIO   VLDL 9 0 - 40 mg/dL   LDL Cholesterol 962 (H) 0 - 99 mg/dL    Comment:        Total Cholesterol/HDL:CHD Risk Coronary Heart Disease Risk Table                     Men   Women  1/2 Average Risk   3.4   3.3  Average Risk       5.0   4.4  2 X Average Risk   9.6   7.1  3 X Average Risk  23.4   11.0        Use the calculated Patient Ratio above and the CHD Risk Table to determine the patient's CHD Risk.        ATP III CLASSIFICATION (LDL):  <100     mg/dL   Optimal  952-841  mg/dL   Near or Above                    Optimal  130-159  mg/dL   Borderline  324-401  mg/dL   High  >027     mg/dL   Very High Performed at  [facility], [address] [place], [place] [address]   POC SARS Coronavirus 2 Ag-ED - Nasal Swab     Status: Normal   Collection Time: 11/30/21  9:53 AM  Result Value Ref Range   SARS Coronavirus 2 Ag Negative Negative  Urinalysis, Routine w reflex microscopic     Status: Abnormal   Collection Time: 11/30/21  3:14 PM  Result Value Ref Range   Color, Urine AMBER (A) YELLOW    Comment: BIOCHEMICALS MAY BE AFFECTED BY COLOR   APPearance CLOUDY (A) CLEAR   Specific Gravity, Urine 1.017 1.005 - 1.030   pH 6.0 5.0 - 8.0   Glucose, UA NEGATIVE NEGATIVE mg/dL   Hgb urine dipstick NEGATIVE NEGATIVE   Bilirubin Urine NEGATIVE NEGATIVE   Ketones, ur 20 (A) NEGATIVE mg/dL   Protein, ur 30 (A) NEGATIVE mg/dL   Nitrite NEGATIVE NEGATIVE   Leukocytes,Ua NEGATIVE NEGATIVE   RBC / HPF 0-5 0 - 5 RBC/hpf   WBC, UA 0-5 0 - 5 WBC/hpf   Bacteria, UA MANY (A) NONE SEEN   Squamous  Epithelial / LPF 0-5 0 - 5   Mucus PRESENT     Comment: Performed at [facility], [address] [place], [place] [address]  Pregnancy, urine     Status: None   Collection Time: 11/30/21  3:14 PM  Result Value Ref Range   Preg Test, Ur NEGATIVE NEGATIVE    Comment:        THE SENSITIVITY OF THIS METHODOLOGY IS >20 mIU/mL. Performed at [facility], [address] [place], [place] [address]   Pregnancy, urine POC     Status: None   Collection Time: 11/30/21  3:17 PM  Result Value Ref Range   Preg Test, Ur NEGATIVE NEGATIVE    Comment:        THE SENSITIVITY OF THIS METHODOLOGY IS >24 mIU/mL   POCT Urine Drug Screen - (I-Screen)     Status: Normal   Collection Time: 11/30/21  3:27 PM  Result Value Ref Range   POC Amphetamine UR None Detected NONE DETECTED (Cut Off Level 1000 ng/mL)   POC Secobarbital (BAR) None Detected NONE DETECTED (Cut Off Level 300 ng/mL)   POC Buprenorphine (BUP) None Detected NONE DETECTED (Cut Off Level 10 ng/mL)   POC Oxazepam (BZO) None Detected NONE DETECTED (Cut Off Level 300 ng/mL)   POC Cocaine UR None Detected NONE DETECTED (Cut Off Level 300 ng/mL)   POC Methamphetamine UR None Detected NONE DETECTED (Cut Off Level 1000 ng/mL)   POC Morphine None Detected NONE DETECTED (Cut Off Level 300 ng/mL)   POC Methadone UR None Detected NONE DETECTED (Cut Off Level 300 ng/mL)   POC Oxycodone UR None Detected NONE DETECTED (Cut Off Level 100 ng/mL)   POC Marijuana UR None Detected NONE DETECTED (Cut Off Level 50 ng/mL)    Blood Alcohol level:  Lab Results  Component Value Date   ETH <10 11/30/2021    Metabolic Disorder Labs:  Lab Results  Component Value Date   HGBA1C 4.7 (L) 11/30/2021   MPG 88.19 11/30/2021   No results found for: PROLACTIN Lab Results  Component Value Date   CHOL 160 11/30/2021   TRIG 44 11/30/2021   HDL 26 (L) 11/30/2021   CHOLHDL 6.2 11/30/2021   VLDL 9 11/30/2021   LDLCALC 125 (H) 11/30/2021    Current  Medications: Current Facility-Administered Medications  Medication Dose Route Frequency Provider Last Rate Last  Admin   acetaminophen (TYLENOL) tablet 650 mg  650 mg Oral Q6H PRN Carlyn Reichert, MD       alum & mag hydroxide-simeth (MAALOX/MYLANTA) 200-200-20 MG/5ML suspension 30 mL  30 mL Oral Q4H PRN Carlyn Reichert, MD       ARIPiprazole (ABILIFY) tablet 5 mg  5 mg Oral Daily Adriel Desrosier, Harrold Donath, MD       Followed by   Melene Muller ON 12/02/2021] ARIPiprazole (ABILIFY) tablet 7.5 mg  7.5 mg Oral Daily Kaelynn Igo, Harrold Donath, MD       Followed by   Melene Muller ON 12/03/2021] ARIPiprazole (ABILIFY) tablet 10 mg  10 mg Oral Daily Tyrrell Stephens, Harrold Donath, MD       diphenhydrAMINE (BENADRYL) capsule 25 mg  25 mg Oral Q8H PRN Nira Conn A, NP   25 mg at 12/01/21 0757   feeding supplement (ENSURE ENLIVE / ENSURE PLUS) liquid 237 mL  237 mL Oral BID BM Hill, Shelbie Hutching, MD       Melene Muller ON 12/02/2021] FLUoxetine (PROZAC) capsule 20 mg  20 mg Oral Daily Davione Lenker, Harrold Donath, MD       hydrOXYzine (ATARAX) tablet 25 mg  25 mg Oral Q8H PRN Carlyn Reichert, MD       loratadine (CLARITIN) tablet 10 mg  10 mg Oral QPM Carlyn Reichert, MD   10 mg at 11/30/21 2149   magnesium hydroxide (MILK OF MAGNESIA) suspension 30 mL  30 mL Oral Daily PRN Carlyn Reichert, MD       norethindrone (AYGESTIN) tablet 5 mg  5 mg Oral Daily Carlyn Reichert, MD       pantoprazole (PROTONIX) EC tablet 40 mg  40 mg Oral Daily Carlyn Reichert, MD   40 mg at 12/01/21 9233   PTA Medications: Medications Prior to Admission  Medication Sig Dispense Refill Last Dose   diphenhydrAMINE (BENADRYL) 25 mg capsule Take 25 mg by mouth every 6 (six) hours as needed for itching or allergies.      hydrOXYzine (ATARAX) 25 MG tablet Take 1 tablet (25 mg total) by mouth every 8 (eight) hours as needed for up to 30 doses for anxiety. 30 tablet 0    levocetirizine (XYZAL) 5 MG tablet Take 1 tablet (5 mg total) by mouth every evening. 30 tablet 5    norethindrone  (AYGESTIN) 5 MG tablet Take 5 mg by mouth daily.      omeprazole (PRILOSEC) 20 MG capsule Take 1 capsule (20 mg total) by mouth daily. 30 capsule 5    VENTOLIN HFA 108 (90 Base) MCG/ACT inhaler Inhale 2 puffs into the lungs every 4 (four) hours as needed.       Musculoskeletal: Strength & Muscle Tone: within normal limits Gait & Station: normal Patient leans: N/A            Psychiatric Specialty Exam:  Presentation  General Appearance: Casual  Eye Contact:Good  Speech:Normal Rate  Speech Volume:Normal  Handedness:Right   Mood and Affect  Mood:Anxious; Depressed; Dysphoric  Affect:Congruent; Constricted   Thought Process  Thought Processes:Linear  Duration of Psychotic Symptoms: Greater than six months  Past Diagnosis of Schizophrenia or Psychoactive disorder: No  Descriptions of Associations:Intact  Orientation:Full (Time, Place and Person)  Thought Content:Logical  Hallucinations:Hallucinations: None  Ideas of Reference:None  Suicidal Thoughts:Suicidal Thoughts: No  Homicidal Thoughts:Homicidal Thoughts: No   Sensorium  Memory:Immediate Good; Recent Good; Remote Good  Judgment:Fair  Insight:Fair   Executive Functions  Concentration:Fair  Attention Span:Fair  Recall:Fair  Fund of Knowledge:Fair  Language:Fair   Psychomotor  Activity  Psychomotor Activity:Psychomotor Activity: Normal   Assets  Assets:Communication Skills; Desire for Improvement; Housing; Social Support; Resilience   Sleep  Sleep:Sleep: Good Number of Hours of Sleep: 8    Physical Exam: Physical Exam Vitals reviewed.  Pulmonary:     Effort: Pulmonary effort is normal.  Neurological:     Motor: No weakness.     Gait: Gait normal.   Review of Systems  Psychiatric/Behavioral:  Positive for depression. The patient is nervous/anxious.    Blood pressure (!) 130/97, pulse 100, temperature 98.7 F (37.1 C), temperature source Oral, resp. rate 18, height  5\' 8"  (1.727 m), weight 92.1 kg, SpO2 100 %. Body mass index is 30.87 kg/m.  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  Per collateral in [facility] note, brother states that patient has been severely depressed, which has resulted in her medical concerns.  On my assessment, it is unclear if the paranoia surrounding her bodily symptoms are secondary to a mood episode, or are independent for mood episode.  Differential diagnosis list could also include delusional disorder versus premorbid psychotic disorder. -Somatic symptom disorder -GAD   PLAN: Safety and Monitoring:  --  Voluntary admission to inpatient psychiatric unit for safety, stabilization and treatment  -- Daily contact with patient to assess and evaluate symptoms and progress in treatment  -- Patient's case to be discussed in multi-disciplinary team meeting  -- Observation Level : q15 minute checks  -- Vital signs:  q12 hours  -- Precautions: suicide, elopement, and assault  2. Psychiatric Diagnoses and Treatment:   -Increase fluoxetine from 10 mg once daily to 20 mg once daily for MDD and GAD -Start Abilify 5 mg once daily for MDD and paranoid symptoms.  We also consider starting Risperdal for the symptoms, but ultimately decided to start Abilify due to significant comorbid mood symptoms.  --  The risks/benefits/side-effects/alternatives to this medication were discussed in detail with the patient and time was given for questions. The patient consents to medication trial.    -- Metabolic profile and EKG monitoring obtained while on an atypical antipsychotic (BMI: Lipid Panel: HbgA1c: QTc:) 447  -- Encouraged patient to participate in unit milieu and in scheduled group therapies   -- Short Term Goals: Ability to identify changes in lifestyle to reduce  recurrence of condition will improve, Ability to verbalize feelings will improve, Ability to disclose and discuss suicidal ideas, Ability to demonstrate self-control will improve, Ability to identify and develop effective coping behaviors will improve, Ability to maintain clinical measurements within normal limits will improve, and Compliance with prescribed medications will improve  -- Long Term Goals: Improvement in symptoms so as ready for discharge    3. Medical Issues Being Addressed:   Tobacco Use Disorder  -- Nicotine patch 21mg /24 hours ordered  -- Smoking cessation encouraged  4. Discharge Planning:   -- Social work and case management to assist with discharge planning and identification of hospital follow-up needs prior to discharge  -- Estimated LOS: 5-7 days  -- Discharge Concerns: Need to establish a safety plan; Medication compliance and effectiveness  -- Discharge Goals: Return home with outpatient referrals for mental health follow-up including medication management/psychotherapy    Observation Level/Precautions:  15 minute checks  Laboratory: See above  Psychotherapy: Group therapy  Medications:    Consultations:    Discharge Concerns:    Estimated  LOS: 5 to 7 days  Other:     Physician Treatment Plan for Primary Diagnosis: MDD (major depressive disorder), recurrent, severe, with psychosis [facility])  Physician Treatment Plan for Secondary Diagnosis: Principal Problem:   MDD (major depressive disorder), recurrent, severe, with psychosis (HCC) Active Problems:   GAD (generalized anxiety disorder)    I certify that inpatient services furnished can reasonably be expected to improve the patient's condition.    Cristy Hilts, MD 5/23/202312:16 PM  Total Time Spent in Direct Patient Care:  I personally spent 60 minutes on the unit in direct patient care. The direct patient care time included face-to-face time with the patient, reviewing the patient's chart,  communicating with other professionals, and coordinating care. Greater than 50% of this time was spent in counseling or coordinating care with the patient regarding goals of hospitalization, psycho-education, and discharge planning needs.   Phineas Inches, MD Psychiatrist

## 2021-12-01 NOTE — Group Note (Signed)
Recreation Therapy Group Note   Group Topic:Animal Assisted Therapy   Group Date: 12/01/2021 Start Time: 1430 End Time: 1515 Facilitators: Caroll Rancher, LRT,CTRS Location: [address] Dayroom   Animal-Assisted Activity (AAA) Program Checklist/Progress Note Patient Eligibility Criteria Checklist & Daily Group note for Rec Tx Intervention  AAA/T Program Assumption of Risk Form signed by Patient/ or Parent Legal Guardian YES  Patient is free of allergies or severe asthma  YES  Patient reports no fear of animals YES  Patient reports no history of cruelty to animals YES  Patient understands their participation is voluntary YES  Patient washes hands before animal contact YES  Patient washes hands after animal contact YES   Group Description: Patients provided opportunity to interact with trained and credentialed [facility] Therapy dog and the community volunteer/dog handler. Patients practiced appropriate animal interaction and were educated on dog safety outside of the hospital in common community settings. Patients were allowed to use dog toys and other items to practice commands, engage the dog in play, and/or complete routine aspects of animal care.   Education: Charity fundraiser, Health visitor, Communication & Social Skills    Affect/Mood: Appropriate   Participation Level: Engaged    Clinical Observations/Individualized Feedback:  Pt attended and participated in group session.  Pt engaged with therapy dog team in an appropriate manner.    Plan: Continue to engage patient in RT group sessions 2-3x/week.   Caroll Rancher, LRT,CTRS 12/01/2021 4:00 PM

## 2021-12-01 NOTE — Progress Notes (Signed)
The patient rated her day as a 4 out of 5 since she went to her groups. She accomplished her goal for the day which was to attend all of her groups.

## 2021-12-01 NOTE — Plan of Care (Signed)
  Problem: Health Behavior/Discharge Planning: Goal: Compliance with treatment plan for underlying cause of condition will improve Outcome: Progressing   Problem: Safety: Goal: Periods of time without injury will increase Outcome: Progressing   Problem: Activity: Goal: Interest or engagement in leisure activities will improve Outcome: Progressing   Problem: Coping: Goal: Will verbalize feelings Outcome: Progressing

## 2021-12-01 NOTE — BHH Counselor (Signed)
Adult Comprehensive Assessment  Patient ID: Carol Schwartz, female   DOB: June 03, 1996, 26 y.o.   MRN: [id number]  Information Source: Information source: Patient  Current Stressors:  Patient states their primary concerns and needs for treatment are:: "Depression, anxiety, and health issues" Patient states their goals for this hospitilization and ongoing recovery are:: "To figure out what is going on with my body" Educational / Learning stressors: Pt reports having a 12th grade education and some college Employment / Job issues: Pt reports being unemployed Family Relationships: Pt reports she does not speak with her 3 siblings very often Museum/gallery curator / Lack of resources (include bankruptcy): Pt reports her mother helps her financially Housing / Lack of housing: Pt reports living with her mother Physical health (include injuries & life threatening diseases): Pt reports multiple health concerns including: GERD, heart palpitations, sleep concerns, and allergic reactions towards food that make her paranoid about eating. Social relationships: Pt reports having few social relationships Substance abuse: Pt denies all substance use Bereavement / Loss: Pt reports her father passed away in 11/14/15  Living/Environment/Situation:  Living Arrangements: Parent Living conditions (as described by patient or guardian): Apartment/Family Who else lives in the home?: Mother How long has patient lived in current situation?: 4 years What is atmosphere in current home: Comfortable, Supportive  Family History:  Marital status: Single Are you sexually active?: No What is your sexual orientation?: Heterosexual Has your sexual activity been affected by drugs, alcohol, medication, or emotional stress?: No Does patient have children?: No  Childhood History:  By whom was/is the patient raised?: Both parents Description of patient's relationship with caregiver when they were a child: "I was closer to my father" Patient's  description of current relationship with people who raised him/her: "I am very close to my mother now because my father passed away in Nov 14, 2015". How were you disciplined when you got in trouble as a child/adolescent?: Spankings and groundings Does patient have siblings?: Yes Number of Siblings: 3 Description of patient's current relationship with siblings: "There is a big differene in our ages and we don't talk very often but we do get along together fine" Did patient suffer any verbal/emotional/physical/sexual abuse as a child?: No Did patient suffer from severe childhood neglect?: No Has patient ever been sexually abused/assaulted/raped as an adolescent or adult?: No Was the patient ever a victim of a crime or a disaster?: No Witnessed domestic violence?: No Has patient been affected by domestic violence as an adult?: No  Education:  Highest grade of school patient has completed: 12th grade, some college Currently a student?: No Learning disability?: No  Employment/Work Situation:   Employment Situation: Unemployed Patient's Job has Been Impacted by Current Illness: Yes Describe how Patient's Job has Been Impacted: Pt reports that her health prevents her from working What is the Longest Time Patient has Held a Job?: 1 year Where was the Patient Employed at that Time?: [employer] Has Patient ever Been in the [employer]?: No  Financial Resources:   Museum/gallery curator resources: Support from parents / caregiver, Medicaid, Food stamps Does patient have a Programmer, applications or guardian?: No  Alcohol/Substance Abuse:   What has been your use of drugs/alcohol within the last 12 months?: Pt denies all substance use If attempted suicide, did drugs/alcohol play a role in this?: No Alcohol/Substance Abuse Treatment Hx: Denies past history Has alcohol/substance abuse ever caused legal problems?: No  Social Support System:   Pensions consultant Support System: Poor Describe Community Support  System: Mother and 1  brother Type of faith/religion: None How does patient's faith help to cope with current illness?: N/A  Leisure/Recreation:   Do You Have Hobbies?: Yes Leisure and Hobbies: Listening to music, walking, and hiking  Strengths/Needs:   What is the patient's perception of their strengths?: Being determined Patient states they can use these personal strengths during their treatment to contribute to their recovery: "It keeps my mind focused on getting myself help" Patient states these barriers may affect/interfere with their treatment: None Patient states these barriers may affect their return to the community: None Other important information patient would like considered in planning for their treatment: None  Discharge Plan:   Currently receiving community mental health services: No Patient states concerns and preferences for aftercare planning are: Pt is interested in therapy and medication management Patient states they will know when they are safe and ready for discharge when: "When I feel better and figure out what is going on with my health" Does patient have access to transportation?: Yes (Family and Melburn Popper) Does patient have financial barriers related to discharge medications?: Yes Patient description of barriers related to discharge medications: Limited income Will patient be returning to same living situation after discharge?: Yes  Summary/Recommendations:   Summary and Recommendations (to be completed by the evaluator): Carol Schwartz is a 26 year old, female, who was admitted to the hospital due to worsening depression, anxiety, and health concerns.  The Pt reports experiencing multiple health concerns such as heart palpitations, GERD, sleep concerns, and allergic reactions to food.  She reports that she has become paranoid about what she eats and is afraid it will make her health deteriorate further.  She reports living with mother and being very close with her.  She  states that her father passed away in 11/15/2015 and that she does not speak with her siblings very often due to large gaps in age.  She reports no childhood trauma or abuse. The Pt reports being unable to work due to her health concerns. She states that her mother is a financial support for her.  The Pt denies all substance use, as well as any current or previous substance use treatment.  While in the hospital the Pt can benefit from crisis stabilization, medication evaluation, group therapy, psycho-education, case management, and discharge planning.  Upon discharge the Pt would like to return home with her mother.  It is recommended that the Pt follow-up with a local outpatient provider for therapy and medication management.  Darleen Crocker. 12/01/2021

## 2021-12-01 NOTE — Progress Notes (Signed)
   12/01/21 1515  Psych Admission Type (Psych Patients Only)  Admission Status Voluntary  Psychosocial Assessment  Patient Complaints Anxiety  Eye Contact Fair  Facial Expression Anxious  Affect Anxious;Depressed  Speech Soft  Interaction Assertive  Motor Activity Other (Comment) (WNL)  Appearance/Hygiene Disheveled  Behavior Characteristics Cooperative  Mood Anxious;Depressed  Thought Process  Coherency Circumstantial  Content Paranoia  Delusions None reported or observed  Perception Derealization  Hallucination None reported or observed  Judgment Poor  Confusion None  Danger to Self  Current suicidal ideation? Denies  Self-Injurious Behavior No self-injurious ideation or behavior indicators observed or expressed   Agreement Not to Harm Self Yes  Description of Agreement verbal  Danger to Others  Danger to Others None reported or observed

## 2021-12-01 NOTE — Progress Notes (Signed)
The patient came out of her bedroom nearly an hour ago and stated that she was told that she has "allergies". She requested to have her clothing laundered. Pt. Has taken multiple showers.

## 2021-12-01 NOTE — Telephone Encounter (Signed)
Writer received VM from pt's mother, Carol Schwartz, asking about a code number, how to bring her clothes, and just general information. She stated that pt had been admitted to [facility] from [facility]. Writer left general VM for mother with [facility] adult unit phone number. Writer did not admit or deny that pt is at [facility]. No other information given.

## 2021-12-01 NOTE — BHH Group Notes (Signed)
Adult Orientation Group Note  Date:  12/01/2021 Time:  11:12 AM  Group Topic/Focus:  Orientation:   The focus of this group is to educate the patient on the purpose and policies of crisis stabilization and provide a format to answer questions about their admission.  The group details unit policies and expectations of patients while admitted.  Participation Level:  Did Not Attend   Carol Schwartz [name] 12/01/2021, 11:12 AM 

## 2021-12-01 NOTE — BHH Suicide Risk Assessment (Signed)
[facility] Admission Suicide Risk Assessment   Nursing information obtained from:  Patient (Simultaneous filing. User may not have seen previous data.) Demographic factors:  Adolescent or young adult, Low socioeconomic status (Simultaneous filing. User may not have seen previous data.) Current Mental Status:  NA (Simultaneous filing. User may not have seen previous data.) Loss Factors:  Financial problems / change in socioeconomic status (Simultaneous filing. User may not have seen previous data.) Historical Factors:  NA (Simultaneous filing. User may not have seen previous data.) Risk Reduction Factors:  Living with another person, especially a relative (Simultaneous filing. User may not have seen previous data.)  Total Time spent with patient: 45 minutes Principal Problem: MDD (major depressive disorder), recurrent, severe, with psychosis (HCC) Diagnosis:  Principal Problem:   MDD (major depressive disorder), recurrent, severe, with psychosis (HCC) Active Problems:   GAD (generalized anxiety disorder)  Subjective Data:  Patient is a 26 year old female who denies having any previously diagnosed psychiatric history, who was admitted to the psychiatric unit for evaluation of extreme anxiety and paranoia.  Patient was medically cleared at [facility].    Prior to admission psychiatric medications: No home psychiatric medications  Patient reports for about 1 year, she has had bodily symptoms that she cannot explain, and that are odd and bizarre to her.  She reports that 1 year ago, her right foot was still up, was seen by podiatrist was recommended to be on bedrest, and then left foot started to be stiff as well.  She reports being on bed rest caused her to be depressed.  She reports that at some point during this time she also started to have GI symptoms, that she could not explain.  She reports believing she has allergies to certain foods, has seen an allergist for work-up, which has been negative thus far.   She reports she also has palpitations and panic attack like symptoms, after she ingests certain foods, but is unable to narrow down which foods these are specifically.  She also reports having panic attacks or sleep. Per nursing, patient was up all night, taking showers constantly, believing that she is being exposed to allergens in the air and is having allergic reactions to these while on the unit.   On assessment today, patient reports she is been very depressed for some time, at least 1-1.5 years.  She reports anhedonia.  She reports sleep is okay, getting 9 to 10 hours per night, without difficulty initiating or maintaining sleep.  Reports energy is okay when her sleep is adequate.  Reports appetite is okay.  Reports some weight loss.  Concentration is impaired during the interview.  Denies any suicidal thoughts.  Denies homicidal thoughts. She reports anxiety is elevated, excessive, and focused on her health concerns.  Patient does have insight into that her anxiety could be causing her bodily symptoms, and that the bodily symptoms are causing her to feel anxious. She does describe feeling "paranoid" about the symptoms she is experiencing in her body, that cannot be explained by any other medical cause thus far.  She reports feeling hypervigilant and on edge due to not having a medical explanation for the symptoms. Denies auditory hallucinations, visual hallucinations, thought insertion, thought control, or ideas of reference. She denies having symptoms meeting criteria for hypomania or mania episode at this time or in the past. Denies history of trauma.  Denies history of abuse.   Patient does report having multiple somatic symptoms that are distressing and resulted in significant disruption of daily  life.  She reports having excessive thoughts, feelings, and behaviors related to the somatic symptoms, as manifested by: Disproportionate and persistent thoughts about the seriousness of 1 symptoms,  persistent high level of anxiety about health or symptoms, excessive time and energy devoted to the symptoms or health concerns.  Past psychiatric history: She denies a previous psychiatric diagnosis.  Denies ever seeing a psychiatrist before (except at [facility]).  Denies ever being prescribed psychiatric medications before this admission.  Patient reports she was started on Prozac yesterday.  Denies any previous psychiatric hospitalization.  Denies any previous suicide attempt.  Past medical history: GERD, gastritis, dysmenorrhea LMP: October 2020, patient sees OB/GYN as prescribed OCP for this Surgeries: 2013 for ovarian cyst Denies history of seizures Allergies: NKDA  Substance use: Denies alcohol use, tobacco use, nicotine use, marijuana use, other illicit drug use  Family history: Psychiatric illness: Denies.  Family history of suicide attempt: Denies.  Social history: [place], moved to the local area around seventh grade.  He was in the IB program at high school.  Graduated high school.  Attended [employer] for 1 year. Single.  No children.    Continued Clinical Symptoms:    The "Alcohol Use Disorders Identification Test", Guidelines for Use in Primary Care, Second Edition.  World Science [employer] [facility](WHO). Score between 0-7:  no or low risk or alcohol related problems. Score between 8-15:  moderate risk of alcohol related problems. Score between 16-19:  high risk of alcohol related problems. Score 20 or above:  warrants further diagnostic evaluation for alcohol dependence and treatment.   CLINICAL FACTORS:   Severe Anxiety and/or Agitation Panic Attacks Depression:   Anhedonia Severe More than one psychiatric diagnosis   Musculoskeletal: Strength & Muscle Tone: within normal limits Gait & Station: normal Patient leans: N/A  Psychiatric Specialty Exam:  Presentation  General Appearance: Casual  Eye Contact:Good  Speech:Normal Rate  Speech  Volume:Normal  Handedness:Right   Mood and Affect  Mood:Anxious; Depressed; Dysphoric  Affect:Congruent; Constricted   Thought Process  Thought Processes:Linear  Descriptions of Associations:Intact  Orientation:Full (Time, Place and Person)  Thought Content:Logical  History of Schizophrenia/Schizoaffective disorder:No  Duration of Psychotic Symptoms:Greater than six months  Hallucinations:Hallucinations: None  Ideas of Reference:None  Suicidal Thoughts:Suicidal Thoughts: No  Homicidal Thoughts:Homicidal Thoughts: No   Sensorium  Memory:Immediate Good; Recent Good; Remote Good  Judgment:Fair  Insight:Fair   Executive Functions  Concentration:Fair  Attention Span:Fair  Recall:Fair  Fund of Knowledge:Fair  Language:Fair   Psychomotor Activity  Psychomotor Activity:Psychomotor Activity: Normal   Assets  Assets:Communication Skills; Desire for Improvement; Housing; Social Support; Resilience   Sleep  Sleep:Sleep: Good Number of Hours of Sleep: 8    Physical Exam: Physical Exam See H&P  ROS See H&P  Blood pressure (!) 130/97, pulse 100, temperature 98.7 F (37.1 C), temperature source Oral, resp. rate 18, height 5\' 8"  (1.727 m), weight 92.1 kg, SpO2 100 %. Body mass index is 30.87 kg/m.   COGNITIVE FEATURES THAT CONTRIBUTE TO RISK:  Thought constriction (tunnel vision)    SUICIDE RISK:   Mild:   There are no identifiable suicide plans, no associated intent, mild dysphoria and related symptoms, good self-control (both objective and subjective assessment), few other risk factors, and identifiable protective factors, including available and accessible social support.  PLAN OF CARE:   See H&P for assessment, diagnosis list, and plan.  I certify that inpatient services furnished can reasonably be expected to improve the patient's condition.   Ardeth PerfectNathan W Merel Santoli,  MD 12/01/2021, 12:28 PM

## 2021-12-02 ENCOUNTER — Encounter (HOSPITAL_COMMUNITY): Payer: Self-pay

## 2021-12-02 MED ORDER — TRAZODONE HCL 50 MG PO TABS
50.0000 mg | ORAL_TABLET | Freq: Every day | ORAL | Status: DC
  Administered 2021-12-02 – 2021-12-04 (×3): 50 mg via ORAL
  Filled 2021-12-02 (×6): qty 1

## 2021-12-02 MED ORDER — PROPRANOLOL HCL 10 MG PO TABS
10.0000 mg | ORAL_TABLET | Freq: Three times a day (TID) | ORAL | Status: DC
  Administered 2021-12-02 – 2021-12-03 (×4): 10 mg via ORAL
  Filled 2021-12-02 (×8): qty 1

## 2021-12-02 NOTE — BH IP Treatment Plan (Signed)
Interdisciplinary Treatment and Diagnostic Plan Update  12/02/2021 Time of Session: 9:15am  Carol Schwartz MRN: [id number]  Principal Diagnosis: MDD (major depressive disorder), recurrent, severe, with psychosis ([place])  Secondary Diagnoses: Principal Problem:   MDD (major depressive disorder), recurrent, severe, with psychosis ([place]) Active Problems:   GAD (generalized anxiety disorder)   Current Medications:  Current Facility-Administered Medications  Medication Dose Route Frequency Provider Last Rate Last Admin   acetaminophen (TYLENOL) tablet 650 mg  650 mg Oral Q6H PRN Corky Sox, MD       alum & mag hydroxide-simeth (MAALOX/MYLANTA) 200-200-20 MG/5ML suspension 30 mL  30 mL Oral Q4H PRN Corky Sox, MD       Derrill Memo ON 12/03/2021] ARIPiprazole (ABILIFY) tablet 10 mg  10 mg Oral Daily Massengill, Nathan, MD       diphenhydrAMINE (BENADRYL) capsule 25 mg  25 mg Oral Q8H PRN Lindon Romp A, NP   25 mg at 12/02/21 0929   feeding supplement (ENSURE ENLIVE / ENSURE PLUS) liquid 237 mL  237 mL Oral BID BM Hill, Jackie Plum, MD   237 mL at 12/01/21 1711   FLUoxetine (PROZAC) capsule 20 mg  20 mg Oral Daily Massengill, Ovid Curd, MD   20 mg at 12/02/21 0814   hydrOXYzine (ATARAX) tablet 25 mg  25 mg Oral Q8H PRN Corky Sox, MD   25 mg at 12/02/21 1003   loratadine (CLARITIN) tablet 10 mg  10 mg Oral QPM Corky Sox, MD   10 mg at 12/01/21 1712   magnesium hydroxide (MILK OF MAGNESIA) suspension 30 mL  30 mL Oral Daily PRN Corky Sox, MD       norethindrone (AYGESTIN) tablet 5 mg  5 mg Oral Daily Corky Sox, MD       pantoprazole (PROTONIX) EC tablet 40 mg  40 mg Oral Daily Corky Sox, MD   40 mg at 12/02/21 0973   propranolol (INDERAL) tablet 10 mg  10 mg Oral Q8H Massengill, Ovid Curd, MD   10 mg at 12/02/21 1001   traZODone (DESYREL) tablet 50 mg  50 mg Oral QHS PRN Massengill, Ovid Curd, MD       PTA Medications: Medications Prior to Admission  Medication Sig  Dispense Refill Last Dose   diphenhydrAMINE (BENADRYL) 25 mg capsule Take 25 mg by mouth every 6 (six) hours as needed for itching or allergies.      hydrOXYzine (ATARAX) 25 MG tablet Take 1 tablet (25 mg total) by mouth every 8 (eight) hours as needed for up to 30 doses for anxiety. 30 tablet 0    levocetirizine (XYZAL) 5 MG tablet Take 1 tablet (5 mg total) by mouth every evening. 30 tablet 5    norethindrone (AYGESTIN) 5 MG tablet Take 5 mg by mouth daily.      omeprazole (PRILOSEC) 20 MG capsule Take 1 capsule (20 mg total) by mouth daily. 30 capsule 5    VENTOLIN HFA 108 (90 Base) MCG/ACT inhaler Inhale 2 puffs into the lungs every 4 (four) hours as needed.       Patient Stressors: Financial difficulties   Health problems   Medication change or noncompliance    Patient Strengths: Capable of independent living  Communication skills  Supportive family/friends   Treatment Modalities: Medication Management, Group therapy, Case management,  1 to 1 session with clinician, Psychoeducation, Recreational therapy.   Physician Treatment Plan for Primary Diagnosis: MDD (major depressive disorder), recurrent, severe, with psychosis ([place]) Long Term Goal(s): Improvement in symptoms so as ready for discharge  Short Term Goals: Ability to identify changes in lifestyle to reduce recurrence of condition will improve Ability to verbalize feelings will improve Ability to disclose and discuss suicidal ideas Ability to demonstrate self-control will improve Ability to identify and develop effective coping behaviors will improve Ability to maintain clinical measurements within normal limits will improve Compliance with prescribed medications will improve  Medication Management: Evaluate patient's response, side effects, and tolerance of medication regimen.  Therapeutic Interventions: 1 to 1 sessions, Unit Group sessions and Medication administration.  Evaluation of Outcomes: Not Met  Physician  Treatment Plan for Secondary Diagnosis: Principal Problem:   MDD (major depressive disorder), recurrent, severe, with psychosis ([place]) Active Problems:   GAD (generalized anxiety disorder)  Long Term Goal(s): Improvement in symptoms so as ready for discharge   Short Term Goals: Ability to identify changes in lifestyle to reduce recurrence of condition will improve Ability to verbalize feelings will improve Ability to disclose and discuss suicidal ideas Ability to demonstrate self-control will improve Ability to identify and develop effective coping behaviors will improve Ability to maintain clinical measurements within normal limits will improve Compliance with prescribed medications will improve     Medication Management: Evaluate patient's response, side effects, and tolerance of medication regimen.  Therapeutic Interventions: 1 to 1 sessions, Unit Group sessions and Medication administration.  Evaluation of Outcomes: Not Met   RN Treatment Plan for Primary Diagnosis: MDD (major depressive disorder), recurrent, severe, with psychosis ([place]) Long Term Goal(s): Knowledge of disease and therapeutic regimen to maintain health will improve  Short Term Goals: Ability to remain free from injury will improve, Ability to participate in decision making will improve, Ability to verbalize feelings will improve, Ability to disclose and discuss suicidal ideas, and Ability to identify and develop effective coping behaviors will improve  Medication Management: RN will administer medications as ordered by provider, will assess and evaluate patient's response and provide education to patient for prescribed medication. RN will report any adverse and/or side effects to prescribing provider.  Therapeutic Interventions: 1 on 1 counseling sessions, Psychoeducation, Medication administration, Evaluate responses to treatment, Monitor vital signs and CBGs as ordered, Perform/monitor CIWA, COWS, AIMS and Fall Risk  screenings as ordered, Perform wound care treatments as ordered.  Evaluation of Outcomes: Not Met   LCSW Treatment Plan for Primary Diagnosis: MDD (major depressive disorder), recurrent, severe, with psychosis ([place]) Long Term Goal(s): Safe transition to appropriate next level of care at discharge, Engage patient in therapeutic group addressing interpersonal concerns.  Short Term Goals: Engage patient in aftercare planning with referrals and resources, Increase social support, Increase emotional regulation, Facilitate acceptance of mental health diagnosis and concerns, Identify triggers associated with mental health/substance abuse issues, and Increase skills for wellness and recovery  Therapeutic Interventions: Assess for all discharge needs, 1 to 1 time with Social worker, Explore available resources and support systems, Assess for adequacy in community support network, Educate family and significant other(s) on suicide prevention, Complete Psychosocial Assessment, Interpersonal group therapy.  Evaluation of Outcomes: Not Met   Progress in Treatment: Attending groups: Yes. Participating in groups: Yes. Taking medication as prescribed: Yes. Toleration medication: Yes. Family/Significant other contact made: Yes, individual(s) contacted:  Mother and Brother  Patient understands diagnosis: Yes. Discussing patient identified problems/goals with staff: Yes. Medical problems stabilized or resolved: Yes. Denies suicidal/homicidal ideation: Yes. Issues/concerns per patient self-inventory: No.   New problem(s) identified: No, Describe:  None   New Short Term/Long Term Goal(s): medication stabilization, elimination of SI thoughts, development of comprehensive mental  wellness plan.   Patient Goals: "Figure out what is causing my health issues"  Discharge Plan or Barriers: Patient recently admitted. CSW will continue to follow and assess for appropriate referrals and possible discharge  planning.   Reason for Continuation of Hospitalization: Anxiety Depression Medication stabilization Suicidal ideation  Estimated Length of Stay: 3 to 7 days   Last [place] Suicide Severity Risk Score: [place] Admission (Current) from 11/30/2021 in [facility] Most recent reading at 11/30/2021  8:00 PM ED from 11/30/20[facility] in [facility] Most recent reading at 11/30/2021 12:47 PM ED from 11/28/2021 in [place] [facility] Most recent reading at 11/28/2021  2:19 PM  C-SSRS RISK CATEGORY No Risk No Risk No Risk       Last PHQ 2/9 Scores:    11/30/2021    9:51 AM  Depression screen PHQ 2/9  Decreased Interest 1  Down, Depressed, Hopeless 2  PHQ - 2 Score 3  Altered sleeping 1  Tired, decreased energy 1  Change in appetite 0  Feeling bad or failure about yourself  1  Trouble concentrating 2  Moving slowly or fidgety/restless 0  Suicidal thoughts 0  PHQ-9 Score 8  Difficult doing work/chores Somewhat difficult    Scribe for Treatment Team: Darleen Crocker, Latanya Presser 12/02/2021 2:26 PM

## 2021-12-02 NOTE — Plan of Care (Signed)
  Problem: Safety: Goal: Periods of time without injury will increase Outcome: Progressing   Problem: Health Behavior/Discharge Planning: Goal: Compliance with therapeutic regimen will improve Outcome: Progressing   Problem: Safety: Goal: Ability to disclose and discuss suicidal ideas will improve Outcome: Progressing   Problem: Education: Goal: Ability to state activities that reduce stress will improve Outcome: Progressing

## 2021-12-02 NOTE — BHH Group Notes (Signed)
The focus of this group is to help patients establish daily goals to achieve during treatment and discuss how the patient can incorporate goal setting into their daily lives to aide in recovery.  Pt did not attend group 

## 2021-12-02 NOTE — Progress Notes (Signed)
NUTRITION ASSESSMENT  Pt identified as at risk on the Malnutrition Screen Tool  INTERVENTION: 1. Supplements: Ensure Plus High Protein po BID, each supplement provides 350 kcal and 20 grams of protein.   NUTRITION DIAGNOSIS: Unintentional weight loss related to sub-optimal intake as evidenced by pt report.   Goal: Pt to meet >/= 90% of their estimated nutrition needs.  Monitor:  PO intake  Assessment:  Pt admitted for depression and anxiety. Pt reports that she suspects she has food allergies but has been unable to identify which foods these are after seeing an allergist.  Reports good appetite.  Per weight records, pt has lost 51 lbs since 08/29/21 (20% wt loss x 3 months, significant for time frame). Ensure supplements have been ordered.   Height: Ht Readings from Last 1 Encounters:  11/30/21 5\' 8"  (1.727 m)    Weight: Wt Readings from Last 1 Encounters:  11/30/21 92.1 kg    Weight Hx: Wt Readings from Last 10 Encounters:  11/30/21 92.1 kg  11/30/21 92.1 kg  08/29/21 115.7 kg  07/22/21 116.6 kg  06/19/21 122.2 kg  06/18/21 127 kg  06/17/21 127 kg  06/16/21 127 kg  06/13/21 (!) 140 kg  06/13/21 (!) 140 kg    BMI:  Body mass index is 30.87 kg/m. Pt meets criteria for obesity based on current BMI.  Estimated Nutritional Needs: Kcal: 25-30 kcal/kg Protein: > 1 gram protein/kg Fluid: 1 ml/kcal  Diet Order:  Diet Order             Diet regular Room service appropriate? Yes; Fluid consistency: Thin  Diet effective now                  Pt is also offered choice of unit snacks mid-morning and mid-afternoon.  Pt is eating as desired.   Lab results and medications reviewed.   Clayton Bibles, MS, RD, LDN Inpatient Clinical Dietitian Contact information available via Amion

## 2021-12-02 NOTE — Group Note (Signed)
Recreation Therapy Group Note   Group Topic:Team Building  Group Date: 12/02/2021 Start Time: 0930 End Time: 1000 Facilitators: Victorino Sparrow, LRT,CTRS Location: [address] Dayroom   Goal Area(s) Addresses:  Patient will effectively work with peer towards shared goal.  Patient will identify skills used to make activity successful.  Patient will identify how skills used during activity can be applied to reach post d/c goals.    Group Description: [employer]. In teams of 5-6, patients were given 25 small craft pipe cleaners. Using the materials provided, patients were instructed to compete again the opposing team(s) to build the tallest free-standing structure from floor level. The activity was timed; difficulty increased by Probation officer as Pharmacist, hospital continued.  Systematically resources were removed with additional directions for example, placing one arm behind their back, working in silence, and shape stipulations. LRT facilitated post-activity discussion reviewing team processes and necessary communication skills involved in completion. Patients were encouraged to reflect how the skills utilized, or not utilized, in this activity can be incorporated to positively impact support systems post discharge.   Affect/Mood: N/A   Participation Level: Did not attend    Clinical Observations/Individualized Feedback:     Plan: Continue to engage patient in RT group sessions 2-3x/week.   Victorino Sparrow, LRT,CTRS  12/02/2021 12:06 PM

## 2021-12-02 NOTE — BHH Group Notes (Signed)
Adult Psychoeducational Group Note  Date:  12/02/2021 Time:  11:24 AM  Group Topic/Focus:  Healthy Communication:   The focus of this group is to discuss communication, barriers to communication, as well as healthy ways to communicate with others.  Participation Level:  Minimal  Participation Quality:  Appropriate  Affect:  Appropriate  Cognitive:  Appropriate  Insight: Appropriate  Engagement in Group:  Engaged  Modes of Intervention:  Discussion  Additional Comments:    Dub Mikes 12/02/2021, 11:24 AM

## 2021-12-02 NOTE — [facility] Group Notes (Signed)
PT attended NA group and was appropriate and attentive. 

## 2021-12-02 NOTE — Progress Notes (Addendum)
[facility] MD Progress Note  12/02/2021 1:23 PM Wendell Fiebig  MRN:  [id number] Subjective:    Patient is a 26 year old female who denies having any previously diagnosed psychiatric history, who was admitted to the psychiatric unit for evaluation of extreme anxiety and paranoia.  Patient was medically cleared at [facility].   Psychiatric team made the following recommendations yesterday: -Start abilify at  once daily  -Start trazodone  QHS PRN -Increase fluoxetine from  QD to  once daily   Patient reports that she had a similar episode as described in her H&P of what she believes might be an anxiety attack when she starts to fall asleep. She feels as though she can't breathe and like she might die, but then is able to sleep through the night. She finds that she is scared to sleep at times now due to these feelings. Otherwise, she feels that the medications are helping her, but that she's still very scared and confused as to why she is having "health problems out of no where." She found that one of the soaps she used on her face last night made her itch, but that another soap fixed the problem immediately. She endorsed that she fears it might be from her touching her hair and then her skin that is making her itch as her depression has kept her from "maintaining her hygiene routine". She reports being itchy at time of interview, but did not actively scratch at her skin.  She reported an episode of panicky-type feeling prior to our conversation, but that it started to calm down when she got her morning medications. Propranolol was added today due to episodes of tachycardia.She described her mood as scared and frustrated. She re-iterated multiple times that she was confused. Her appetite has been good and she reports that she has not had any issues with the food here. She denies all SI, HI, and AVH.  Principal Problem: MDD (major depressive disorder), recurrent, severe, with psychosis (HCC) Diagnosis:  Principal Problem:   MDD (major depressive disorder), recurrent, severe, with psychosis (HCC) Active Problems:   GAD (generalized anxiety disorder)    Past Psychiatric History:  Patient denies any previous psychiatric diagnosis or treatment. No h/o SA/SI/SH.  Past Medical History:  Past Medical History:  Diagnosis Date   Anxiety    GERD (gastroesophageal reflux disease)     Past Surgical History:  Procedure Laterality Date   LAPAROSCOPY  02/24/2012   Procedure: LAPAROSCOPY OPERATIVE;  Surgeon: Geryl Rankins, MD;  Location: [facility];  Service: Gynecology;  Laterality: N/A;  Drainage of Left Ovarian Cyst   OVARIAN CYST REMOVAL  02/24/2012   Procedure: OVARIAN CYSTECTOMY;  Surgeon: Geryl Rankins, MD;  Location: [facility];  Service: Gynecology;  Laterality: Right;   Family History:  Family History  Problem Relation Age of Onset   Allergic rhinitis Mother    Diabetes Father    Heart attack Father 49   Allergic rhinitis Brother    Hypertension Other    Family Psychiatric  History: None known.  Social History:  Social History   Substance and Sexual Activity  Alcohol Use No     Social History   Substance and Sexual Activity  Drug Use No    Social History   Socioeconomic History   Marital status: Single    Spouse name: Not on file   Number of children: 0   Years of education: Not on file   Highest education level: Not on file  Occupational History   Not  on file  Tobacco Use   Smoking status: Never    Passive exposure: Never   Smokeless tobacco: Never  Vaping Use   Vaping Use: Never used  Substance and Sexual Activity   Alcohol use: No   Drug use: No   Sexual activity: Not Currently  Other Topics Concern   Not on file  Social History Narrative   Not on file   Social Determinants of Health   Financial Resource Strain: Not on file  Food Insecurity: Not on file  Transportation Needs: Not on file  Physical Activity: Not on file  Stress: Not on file  Social  Connections: Not on file   Additional Social History:       Sleep: Fair  Appetite:  Good  Current Medications: Current Facility-Administered Medications  Medication Dose Route Frequency Provider Last Rate Last Admin   acetaminophen (TYLENOL) tablet 650 mg  650 mg Oral Q6H PRN Carlyn Reichert, MD       alum & mag hydroxide-simeth (MAALOX/MYLANTA) 200-200-20 MG/5ML suspension 30 mL  30 mL Oral Q4H PRN Carlyn Reichert, MD       Melene Muller ON 12/03/2021] ARIPiprazole (ABILIFY) tablet 10 mg  10 mg Oral Daily Tosca Pletz, MD       diphenhydrAMINE (BENADRYL) capsule 25 mg  25 mg Oral Q8H PRN Nira Conn A, NP   25 mg at 12/02/21 0929   feeding supplement (ENSURE ENLIVE / ENSURE PLUS) liquid 237 mL  237 mL Oral BID BM Hill, Shelbie Hutching, MD   237 mL at 12/01/21 1711   FLUoxetine (PROZAC) capsule 20 mg  20 mg Oral Daily Laural Eiland, Harrold Donath, MD   20 mg at 12/02/21 0263   hydrOXYzine (ATARAX) tablet 25 mg  25 mg Oral Q8H PRN Carlyn Reichert, MD   25 mg at 12/02/21 1003   loratadine (CLARITIN) tablet 10 mg  10 mg Oral QPM Carlyn Reichert, MD   10 mg at 12/01/21 1712   magnesium hydroxide (MILK OF MAGNESIA) suspension 30 mL  30 mL Oral Daily PRN Carlyn Reichert, MD       norethindrone (AYGESTIN) tablet 5 mg  5 mg Oral Daily Carlyn Reichert, MD       pantoprazole (PROTONIX) EC tablet 40 mg  40 mg Oral Daily Carlyn Reichert, MD   40 mg at 12/02/21 7858   propranolol (INDERAL) tablet 10 mg  10 mg Oral Q8H Phoebe Marter, Harrold Donath, MD   10 mg at 12/02/21 1001   traZODone (DESYREL) tablet 50 mg  50 mg Oral QHS PRN Tylah Mancillas, Harrold Donath, MD        Lab Results:  Results for orders placed or performed during the hospital encounter of 11/30/21 (from the past 48 hour(s))  Urinalysis, Routine w reflex microscopic     Status: Abnormal   Collection Time: 11/30/21  3:14 PM  Result Value Ref Range   Color, Urine AMBER (A) YELLOW    Comment: BIOCHEMICALS MAY BE AFFECTED BY COLOR   APPearance CLOUDY (A) CLEAR    Specific Gravity, Urine 1.017 1.005 - 1.030   pH 6.0 5.0 - 8.0   Glucose, UA NEGATIVE NEGATIVE mg/dL   Hgb urine dipstick NEGATIVE NEGATIVE   Bilirubin Urine NEGATIVE NEGATIVE   Ketones, ur 20 (A) NEGATIVE mg/dL   Protein, ur 30 (A) NEGATIVE mg/dL   Nitrite NEGATIVE NEGATIVE   Leukocytes,Ua NEGATIVE NEGATIVE   RBC / HPF 0-5 0 - 5 RBC/hpf   WBC, UA 0-5 0 - 5 WBC/hpf   Bacteria, UA MANY (A) NONE  SEEN   Squamous Epithelial / LPF 0-5 0 - 5   Mucus PRESENT     Comment: Performed at [facility], [address] [place], [place] [address]  Pregnancy, urine     Status: None   Collection Time: 11/30/21  3:14 PM  Result Value Ref Range   Preg Test, Ur NEGATIVE NEGATIVE    Comment:        THE SENSITIVITY OF THIS METHODOLOGY IS >20 mIU/mL. Performed at [facility], [address] [place], [place] [address]   Pregnancy, urine POC     Status: None   Collection Time: 11/30/21  3:17 PM  Result Value Ref Range   Preg Test, Ur NEGATIVE NEGATIVE    Comment:        THE SENSITIVITY OF THIS METHODOLOGY IS >24 mIU/mL   POCT Urine Drug Screen - (I-Screen)     Status: Normal   Collection Time: 11/30/21  3:27 PM  Result Value Ref Range   POC Amphetamine UR None Detected NONE DETECTED (Cut Off Level 1000 ng/mL)   POC Secobarbital (BAR) None Detected NONE DETECTED (Cut Off Level 300 ng/mL)   POC Buprenorphine (BUP) None Detected NONE DETECTED (Cut Off Level 10 ng/mL)   POC Oxazepam (BZO) None Detected NONE DETECTED (Cut Off Level 300 ng/mL)   POC Cocaine UR None Detected NONE DETECTED (Cut Off Level 300 ng/mL)   POC Methamphetamine UR None Detected NONE DETECTED (Cut Off Level 1000 ng/mL)   POC Morphine None Detected NONE DETECTED (Cut Off Level 300 ng/mL)   POC Methadone UR None Detected NONE DETECTED (Cut Off Level 300 ng/mL)   POC Oxycodone UR None Detected NONE DETECTED (Cut Off Level 100 ng/mL)   POC Marijuana UR None Detected NONE DETECTED (Cut Off Level 50 ng/mL)    Blood  Alcohol level:  Lab Results  Component Value Date   ETH <10 11/30/2021    Metabolic Disorder Labs: Lab Results  Component Value Date   HGBA1C 4.7 (L) 11/30/2021   MPG 88.19 11/30/2021   No results found for: PROLACTIN Lab Results  Component Value Date   CHOL 160 11/30/2021   TRIG 44 11/30/2021   HDL 26 (L) 11/30/2021   CHOLHDL 6.2 11/30/2021   VLDL 9 11/30/2021   LDLCALC 125 (H) 11/30/2021    Physical Findings: AIMS: Facial and Oral Movements Muscles of Facial Expression: None, normal Lips and Perioral Area: None, normal Jaw: None, normal Tongue: None, normal,Extremity Movements Upper (arms, wrists, hands, fingers): None, normal Lower (legs, knees, ankles, toes): None, normal, Trunk Movements Neck, shoulders, hips: None, normal, Overall Severity Severity of abnormal movements (highest score from questions above): None, normal Incapacitation due to abnormal movements: None, normal Patient's awareness of abnormal movements (rate only patient's report): No Awareness, Dental Status Current problems with teeth and/or dentures?: No Does patient usually wear dentures?: No  CIWA:    COWS:      Musculoskeletal: Strength & Muscle Tone: within normal limits Gait & Station: normal Patient leans: N/A  Psychiatric Specialty Exam:  Presentation  General Appearance: Casual; Disheveled  Eye Contact:Good  Speech:Clear and Coherent; Normal Rate  Speech Volume:Normal  Handedness:Right   Mood and Affect  Mood:Anxious; Depressed; Dysphoric  Affect:Congruent   Thought Process  Thought Processes:Goal Directed  Descriptions of Associations:Intact  Orientation:Full (Time, Place and Person)  Thought Content:Logical; Perseveration  History of Schizophrenia/Schizoaffective disorder:No  Duration of Psychotic Symptoms:Less than six months  Hallucinations:Hallucinations: None  Ideas of Reference:None  Suicidal Thoughts:Suicidal  Thoughts: No  Homicidal  Thoughts:Homicidal Thoughts: No   Sensorium  Memory:Immediate Good; Recent Good; Remote Good  Judgment:Fair  Insight:lacking   Executive Functions  Concentration:Fair  Attention Span:Fair  Recall:Good  Fund of Knowledge:Good  Language:Good   Psychomotor Activity  Psychomotor Activity:Psychomotor Activity: Normal   Assets  Assets:Communication Skills; Desire for Improvement; Housing; Resilience; Social Support   Sleep  Sleep:Sleep: Fair    Physical Exam: Physical Exam Vitals and nursing note reviewed.  Constitutional:      General: She is not in acute distress.    Appearance: Normal appearance. She is not ill-appearing, toxic-appearing or diaphoretic.  Eyes:     General: No scleral icterus. Pulmonary:     Effort: Pulmonary effort is normal.  Neurological:     Mental Status: She is alert and oriented to person, place, and time.     Gait: Gait normal.   Review of Systems  Constitutional:  Negative for chills, fever, malaise/fatigue and weight loss.  Respiratory:  Negative for cough.   Gastrointestinal:  Negative for abdominal pain, diarrhea and vomiting.  Musculoskeletal:  Negative for joint pain and myalgias.  Psychiatric/Behavioral:  Positive for depression. Negative for hallucinations, substance abuse and suicidal ideas. The patient is nervous/anxious.    Blood pressure 127/82, pulse (!) 102, temperature 98.1 F (36.7 C), temperature source Oral, resp. rate 18, height  (1.727 m), weight 92.1 kg, SpO2 99 %. Body mass index is 30.87 kg/m.   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  Per collateral in [facility] note, brother states that patient has been severely depressed, which has resulted in her medical concerns.  On my assessment, it is unclear if the paranoia surrounding  her bodily symptoms are secondary to a mood episode, or are independent for mood episode.  Differential diagnosis list could also include delusional disorder versus premorbid psychotic disorder. -Somatic symptom disorder -GAD     PLAN: Safety and Monitoring:             --  Voluntary admission to inpatient psychiatric unit for safety, stabilization and treatment             -- Daily contact with patient to assess and evaluate symptoms and progress in treatment             -- Patient's case to be discussed in multi-disciplinary team meeting             -- Observation Level : q15 minute checks             -- Vital signs:  q12 hours             -- Precautions: suicide, elopement, and assault   2. Psychiatric Diagnoses and Treatment:    -Continue fluoxetine 20 mg once daily for MDD and GAD -Increase abilify from 7.5 mg to 10 mg once daily tomorrow.  We initially also considered starting Risperdal for the symptoms, but ultimately decided to start Abilify due to significant comorbid mood symptoms. -change trazodone 50 mg qhs from PRN to scheduled -start propranolol 10 mg tid - for anxiety and tachycardia    --  The risks/benefits/side-effects/alternatives to this medication were discussed in detail with the patient and time was given for questions. The patient consents to medication trial.                --  Metabolic profile and EKG monitoring obtained while on an atypical antipsychotic (BMI: Lipid Panel: HbgA1c: QTc:) 447             -- Encouraged patient to participate in unit milieu and in scheduled group therapies              -- Short Term Goals: Ability to identify changes in lifestyle to reduce recurrence of condition will improve, Ability to verbalize feelings will improve, Ability to disclose and discuss suicidal ideas, Ability to demonstrate self-control will improve, Ability to identify and develop effective coping behaviors will improve, Ability to maintain clinical measurements  within normal limits will improve, and Compliance with prescribed medications will improve             -- Long Term Goals: Improvement in symptoms so as ready for discharge                3. Medical Issues Being Addressed:              Tobacco Use Disorder             -- Nicotine patch 21mg /24 hours ordered             -- Smoking cessation encouraged   4. Discharge Planning:              -- Social work and case management to assist with discharge planning and identification of hospital follow-up needs prior to discharge             -- Estimated LOS: 5-7 days             -- Discharge Concerns: Need to establish a safety plan; Medication compliance and effectiveness             -- Discharge Goals: Return home with outpatient referrals for mental health follow-up including medication management/psychotherapy  Louanne SkyeNatalie L Almond, Medical Student 12/02/2021, 1:23 PM  Total Time Spent in Direct Patient Care:  I personally spent 35 minutes on the unit in direct patient care. The direct patient care time included face-to-face time with the patient, reviewing the patient's chart, communicating with other professionals, and coordinating care. Greater than 50% of this time was spent in counseling or coordinating care with the patient regarding goals of hospitalization, psycho-education, and discharge planning needs.  I personally was present and performed or re-performed the history, physical exam and medical decision-making activities of this service and have verified that the service and findings are accurately documented in the student's note, , as addended by me or notated below:  I directly edited the note, as above. Pt reports mood continues to be very depressed, down and sad. Rates mood at 1/10 (0 worst and 10 best) over the last 24 hours.   Phineas InchesNathan Hennesy Sobalvarro, MD Psychiatrist

## 2021-12-02 NOTE — Progress Notes (Signed)
Patient observed pacing in the hallway early this morning. Patient appeared anxious but cooperative. Patient med compliant. Denies SI,HI, and A/V/H with no plan/intent. Patients main concern involved itching on her skin bilaterally on arms and stated "I wonder if its mental or physical.. the reason im getting these rashes." Patient given benadryl po prn at 0929 and also given atarax at 1003 per patient request for anxiety. Medication noted to provide temporary relief. Patient denied any pain and in no current distress.

## 2021-12-02 NOTE — Group Note (Signed)
LCSW Group Therapy Note   Group Date: 12/02/2021 Start Time: 1300 End Time: 1400   Type of Therapy and Topic:  Group Therapy: Boundaries  Participation Level:  Active  Description of Group: This group will address the use of boundaries in their personal lives. Patients will explore why boundaries are important, the difference between healthy and unhealthy boundaries, and negative and postive outcomes of different boundaries and will look at how boundaries can be crossed.  Patients will be encouraged to identify current boundaries in their own lives and identify what kind of boundary is being set. Facilitators will guide patients in utilizing problem-solving interventions to address and correct types boundaries being used and to address when no boundary is being used. Understanding and applying boundaries will be explored and addressed for obtaining and maintaining a balanced life. Patients will be encouraged to explore ways to assertively make their boundaries and needs known to significant others in their lives, using other group members and facilitator for role play, support, and feedback.  Therapeutic Goals:  1.  Patient will identify areas in their life where setting clear boundaries could be  used to improve their life.  2.  Patient will identify signs/triggers that a boundary is not being respected. 3.  Patient will identify two ways to set boundaries in order to achieve balance in  their lives: 4.  Patient will demonstrate ability to communicate their needs and set boundaries  through discussion and/or role plays  Summary of Patient Progress:  The Pt attended group and remained there the entire time.  The Pt accepted all worksheets and materials provided.  The Pt participated in open discussion and was appropriate with their peers.  The Pt was able to discuss healthy and unhealthy boundaries, and the negative and positive outcomes of those different boundaries.  Therapeutic Modalities:    Cognitive Behavioral Therapy Solution-Focused Therapy  Darleen Crocker, Latanya Presser 12/02/2021  2:02 PM

## 2021-12-03 MED ORDER — HYDROCERIN EX CREA
TOPICAL_CREAM | Freq: Two times a day (BID) | CUTANEOUS | Status: DC
  Filled 2021-12-03: qty 113

## 2021-12-03 MED ORDER — PRAZOSIN HCL 1 MG PO CAPS
1.0000 mg | ORAL_CAPSULE | Freq: Every day | ORAL | Status: DC
  Administered 2021-12-03: 1 mg via ORAL
  Filled 2021-12-03 (×3): qty 1

## 2021-12-03 MED ORDER — PROPRANOLOL HCL ER 60 MG PO CP24
60.0000 mg | ORAL_CAPSULE | Freq: Every day | ORAL | Status: DC
Start: 2021-12-03 — End: 2021-12-03

## 2021-12-03 MED ORDER — PROPRANOLOL HCL 20 MG PO TABS
20.0000 mg | ORAL_TABLET | Freq: Three times a day (TID) | ORAL | Status: DC
  Administered 2021-12-03 – 2021-12-07 (×10): 20 mg via ORAL
  Filled 2021-12-03 (×19): qty 1

## 2021-12-03 MED ORDER — NORETHINDRONE ACETATE 5 MG PO TABS
5.0000 mg | ORAL_TABLET | Freq: Every day | ORAL | Status: DC
  Administered 2021-12-03 – 2021-12-08 (×6): 5 mg via ORAL
  Filled 2021-12-03 (×7): qty 1

## 2021-12-03 MED ORDER — FLUOXETINE HCL 10 MG PO CAPS
30.0000 mg | ORAL_CAPSULE | Freq: Every day | ORAL | Status: DC
  Administered 2021-12-04 – 2021-12-07 (×4): 30 mg via ORAL
  Filled 2021-12-03 (×6): qty 3

## 2021-12-03 NOTE — Group Note (Unsigned)
Date:  12/03/2021 Time:  10:27 AM  Group Topic/Focus:  Orientation:   The focus of this group is to educate the patient on the purpose and policies of crisis stabilization and provide a format to answer questions about their admission.  The group details unit policies and expectations of patients while admitted.     Participation Level:  {B[facility] PARTICIPATION HD:996081  Participation Quality:  {BHH PARTICIPATION QUALITY:22265}  Affect:  {BHH AFFECT:22266}  Cognitive:  {B[facility] COGNITIVE:22267}  Insight: {BHH Insight2:20797}  Engagement in Group:  {BHH ENGAGEMENT IN JY:3131603  Modes of Intervention:  {BHH MODES OF INTERVENTION:22269}  Additional Comments:  ***  Garvin Fila 12/03/2021, 10:27 AM

## 2021-12-03 NOTE — [facility] Suicide Risk Assessment (Signed)
[facility] INPATIENT:  Family/Significant Other Suicide Prevention Education  Suicide Prevention Education:  Education Completed; Carol Schwartz [phone] (Brother) has been identified by the patient as the family member/significant other with whom the patient will be residing, and identified as the person(s) who will aid the patient in the event of a mental health crisis (suicidal ideations/suicide attempt).  With written consent from the patient, the family member/significant other has been provided the following suicide prevention education, prior to the and/or following the discharge of the patient.  The suicide prevention education provided includes the following: Suicide risk factors Suicide prevention and interventions National Suicide Hotline telephone number [facility] assessment telephone number [facility] Emergency Assistance [place] and/or Residential Mobile Crisis Unit telephone number  Request made of family/significant other to: Remove weapons (e.g., guns, rifles, knives), all items previously/currently identified as safety concern.   Remove drugs/medications (over-the-counter, prescriptions, illicit drugs), all items previously/currently identified as a safety concern.  The family member/significant other verbalizes understanding of the suicide prevention education information provided.  The family member/significant other agrees to remove the items of safety concern listed above.  CSW spoke with Carol Schwartz who states that both his mother and sister have been experiencing issues with Depression.  He states that his sister "stopped caring for herself and the house".  He states that he is at his mother and sisters home now and is attempting to clean it for them.  He states "I believe my sisters health issues are coming from her environment because she is not taking care of it or herself".  He confirms that his sister has been living with his mother and that  she can return there after discharge.  Carol Schwartz states that there are no firearms or weapons in the home.  CSW completed SPE with Carol Schwartz.   Frutoso Chase Carol Schwartz 12/03/2021, 10:48 AM

## 2021-12-03 NOTE — Progress Notes (Signed)

## 2021-12-03 NOTE — Progress Notes (Signed)
[facility] MD Progress Note  12/03/2021 9:52 AM Carol Schwartz  MRN:  [id number] Subjective:    Patient is a 26 year old female who denies having any previously diagnosed psychiatric history, who was admitted to the psychiatric unit for evaluation of extreme anxiety and paranoia.  Patient was medically cleared at [facility].   Psychiatric team made the following recommendations yesterday: -Continue fluoxetine 20 mg once daily -Increase abilify from 7.5 mg to 10 mg once daily  -change trazodone 50 mg qhs from PRN to scheduled -start propranolol 10 mg tid   Patient reports that she once again had episodes of anxiety over night where she felt like she couldn't breathe and was dying. She endorses worsening sleep with only 2 hours of sleep, with difficulty falling asleep and staying asleep. Her mood is less depressed since yesterday. Reports anxiety is up and down, but still elevated. She also experienced more itching, however, she stated today that she realized it might be dry skin since she is so worried about allergens and is overwashing herself. She has not had any issues eating or with her appetite. She denies all HI/SI/AVH. She denies any other physical symptoms or medication side effects. She asked if the medications could be causing these overnight anxiety episodes, but she then confirmed these are the same episodes she had for months prior. She endorses that she thinks the medications are helping her.  Principal Problem: MDD (major depressive disorder), recurrent, severe, with psychosis ([place]) Diagnosis: Principal Problem:   MDD (major depressive disorder), recurrent, severe, with psychosis ([place]) Active Problems:   GAD (generalized anxiety disorder)    Past Psychiatric History:  Patient denies any previous psychiatric diagnosis or treatment.  Denies any history of suicide attempt or other self-harm behavior.  Past Medical History:  Past Medical History:  Diagnosis Date   Anxiety    GERD  (gastroesophageal reflux disease)     Past Surgical History:  Procedure Laterality Date   LAPAROSCOPY  02/24/2012   Procedure: LAPAROSCOPY OPERATIVE;  Surgeon: Thurnell Lose, MD;  Location: [facility];  Service: Gynecology;  Laterality: N/A;  Drainage of Left Ovarian Cyst   OVARIAN CYST REMOVAL  02/24/2012   Procedure: OVARIAN CYSTECTOMY;  Surgeon: Thurnell Lose, MD;  Location: [facility];  Service: Gynecology;  Laterality: Right;   Family History:  Family History  Problem Relation Age of Onset   Allergic rhinitis Mother    Diabetes Father    Heart attack Father 52   Allergic rhinitis Brother    Hypertension Other    Family Psychiatric  History: None known.  Social History:  Social History   Substance and Sexual Activity  Alcohol Use No     Social History   Substance and Sexual Activity  Drug Use No    Social History   Socioeconomic History   Marital status: Single    Spouse name: Not on file   Number of children: 0   Years of education: Not on file   Highest education level: Not on file  Occupational History   Not on file  Tobacco Use   Smoking status: Never    Passive exposure: Never   Smokeless tobacco: Never  Vaping Use   Vaping Use: Never used  Substance and Sexual Activity   Alcohol use: No   Drug use: No   Sexual activity: Not Currently  Other Topics Concern   Not on file  Social History Narrative   Not on file   Social Determinants of Health   Financial Resource Strain:  Not on file  Food Insecurity: Not on file  Transportation Needs: Not on file  Physical Activity: Not on file  Stress: Not on file  Social Connections: Not on file   Additional Social History:       Sleep: Poor  Appetite:  Good  Current Medications: Current Facility-Administered Medications  Medication Dose Route Frequency Provider Last Rate Last Admin   acetaminophen (TYLENOL) tablet 650 mg  650 mg Oral Q6H PRN Corky Sox, MD       alum & mag hydroxide-simeth  (MAALOX/MYLANTA) 200-200-20 MG/5ML suspension 30 mL  30 mL Oral Q4H PRN Corky Sox, MD       ARIPiprazole (ABILIFY) tablet 10 mg  10 mg Oral Daily Leelynd Maldonado, Ovid Curd, MD   10 mg at 12/03/21 [id number]   diphenhydrAMINE (BENADRYL) capsule 25 mg  25 mg Oral Q8H PRN Lindon Romp A, NP   25 mg at 12/02/21 2121   feeding supplement (ENSURE ENLIVE / ENSURE PLUS) liquid 237 mL  237 mL Oral BID BM Hill, Jackie Plum, MD   237 mL at 12/02/21 1428   FLUoxetine (PROZAC) capsule 20 mg  20 mg Oral Daily Baleigh Rennaker, Ovid Curd, MD   20 mg at 12/03/21 [id number]   hydrocerin (EUCERIN) cream   Topical BID Concepcion Gillott, Ovid Curd, MD       hydrOXYzine (ATARAX) tablet 25 mg  25 mg Oral Q8H PRN Corky Sox, MD   25 mg at 12/02/21 1003   loratadine (CLARITIN) tablet 10 mg  10 mg Oral QPM Corky Sox, MD   10 mg at 12/02/21 1721   magnesium hydroxide (MILK OF MAGNESIA) suspension 30 mL  30 mL Oral Daily PRN Corky Sox, MD       norethindrone (AYGESTIN) tablet 5 mg  5 mg Oral Daily Panzy Bubeck, MD       pantoprazole (PROTONIX) EC tablet 40 mg  40 mg Oral Daily Corky Sox, MD   40 mg at 12/03/21 [id number]   propranolol (INDERAL) tablet 20 mg  20 mg Oral Q8H Cashel Bellina, MD       traZODone (DESYREL) tablet 50 mg  50 mg Oral QHS Nevelyn Mellott, Ovid Curd, MD   50 mg at 12/02/21 2121    Lab Results:  No results found for this or any previous visit (from the past 66 hour(s)).   Blood Alcohol level:  Lab Results  Component Value Date   ETH <10 0000000    Metabolic Disorder Labs: Lab Results  Component Value Date   HGBA1C 4.7 (L) 11/30/2021   MPG 88.19 11/30/2021   No results found for: PROLACTIN Lab Results  Component Value Date   CHOL 160 11/30/2021   TRIG 44 11/30/2021   HDL 26 (L) 11/30/2021   CHOLHDL 6.2 11/30/2021   VLDL 9 11/30/2021   LDLCALC 125 (H) 11/30/2021    Physical Findings: AIMS: Facial and Oral Movements Muscles of Facial Expression: None, normal Lips and Perioral Area:  None, normal Jaw: None, normal Tongue: None, normal,Extremity Movements Upper (arms, wrists, hands, fingers): None, normal Lower (legs, knees, ankles, toes): None, normal, Trunk Movements Neck, shoulders, hips: None, normal, Overall Severity Severity of abnormal movements (highest score from questions above): None, normal Incapacitation due to abnormal movements: None, normal Patient's awareness of abnormal movements (rate only patient's report): No Awareness, Dental Status Current problems with teeth and/or dentures?: No Does patient usually wear dentures?: No  CIWA:    COWS:      Musculoskeletal: Strength & Muscle Tone: within normal limits Gait & Station: normal  Patient leans: N/A  Psychiatric Specialty Exam:  Presentation  General Appearance: Casual; Disheveled  Eye Contact:Good  Speech:Clear and Coherent; Normal Rate  Speech Volume:Normal  Handedness:Right   Mood and Affect  Mood:Anxious; Depressed; Dysphoric  Affect:Congruent   Thought Process  Thought Processes:Goal Directed  Descriptions of Associations:Intact  Orientation:Full (Time, Place and Person)  Thought Content:Logical; Perseveration  History of Schizophrenia/Schizoaffective disorder:No  Duration of Psychotic Symptoms:Less than six months  Hallucinations:Hallucinations: None  Ideas of Reference:None  Suicidal Thoughts:Suicidal Thoughts: No  Homicidal Thoughts:Homicidal Thoughts: No   Sensorium  Memory:Immediate Good; Recent Good; Remote Good  Judgment:Fair  Insight:lacking   Executive Functions  Concentration:Fair  Attention Span:Fair  Recall:Good  Fund of Knowledge:Good  Language:Good   Psychomotor Activity  Psychomotor Activity:Psychomotor Activity: Normal   Assets  Assets:Communication Skills; Desire for Improvement; Housing; Resilience; Social Support   Sleep  Sleep:Sleep: Fair    Physical Exam: Physical Exam Vitals and nursing note reviewed.   Constitutional:      General: She is not in acute distress.    Appearance: Normal appearance. She is not ill-appearing, toxic-appearing or diaphoretic.  HENT:     Head: Normocephalic.  Eyes:     General: No scleral icterus. Pulmonary:     Effort: Pulmonary effort is normal.  Neurological:     Mental Status: She is alert and oriented to person, place, and time.     Gait: Gait normal.   Review of Systems  Constitutional:  Negative for chills, fever, malaise/fatigue and weight loss.  Respiratory:  Negative for cough.   Cardiovascular:  Negative for chest pain.  Gastrointestinal:  Negative for abdominal pain, diarrhea and vomiting.  Musculoskeletal:  Negative for joint pain and myalgias.  Psychiatric/Behavioral:  Positive for depression. Negative for hallucinations, substance abuse and suicidal ideas. The patient is nervous/anxious and has insomnia.    Blood pressure 108/71, pulse (!) 107, temperature 98.1 F (36.7 C), temperature source Oral, resp. rate 18, height 5\' 8"  (1.727 m), weight 92.1 kg, SpO2 92 %. Body mass index is 30.87 kg/m.   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  (Per collateral in [facility] note, brother states that patient has been severely depressed, which has resulted in her medical concerns.  On my assessment, it is unclear if the paranoia surrounding her bodily symptoms are secondary to a mood episode, or are independent for mood episode.  Differential diagnosis list could also include delusional disorder versus premorbid psychotic disorder.) -Somatic symptom disorder -GAD     PLAN: Safety and Monitoring:             --  Voluntary admission to inpatient psychiatric unit for safety, stabilization and treatment             -- Daily contact with patient to assess and evaluate symptoms  and progress in treatment             -- Patient's case to be discussed in multi-disciplinary team meeting             -- Observation Level : q15 minute checks             -- Vital signs:  q12 hours             -- Precautions: suicide, elopement, and assault   2. Psychiatric Diagnoses and Treatment:    -Start prazosin 1 mg QHS  for nightmares and nocturnal panic episodes  -Increase fluoxetine from 20 mg to 30 mg once daily for MDD and GAD -Continue Abilify at 10mg  once daily -Continue trazodone 50 mg qhs -Increase propranolol from 10 mg tid to 20 mg tid - for anxiety and tachycardia    --  The risks/benefits/side-effects/alternatives to this medication were discussed in detail with the patient and time was given for questions. The patient consents to medication trial.                -- Metabolic profile and EKG monitoring obtained while on an atypical antipsychotic (BMI: Lipid Panel: HbgA1c: QTc:) 447             -- Encouraged patient to participate in unit milieu and in scheduled group therapies              -- Short Term Goals: Ability to identify changes in lifestyle to reduce recurrence of condition will improve, Ability to verbalize feelings will improve, Ability to disclose and discuss suicidal ideas, Ability to demonstrate self-control will improve, Ability to identify and develop effective coping behaviors will improve, Ability to maintain clinical measurements within normal limits will improve, and Compliance with prescribed medications will improve             -- Long Term Goals: Improvement in symptoms so as ready for discharge                3. Medical Issues Being Addressed:              Tobacco Use Disorder             -- Nicotine patch 21mg /24 hours ordered             -- Smoking cessation encouraged   4. Discharge Planning:              -- Social work and case management to assist with discharge planning and identification of hospital follow-up needs prior to discharge              -- Estimated LOS: 5-7 days             -- Discharge Concerns: Need to establish a safety plan; Medication compliance and effectiveness             -- Discharge Goals: Return home with outpatient referrals for mental health follow-up including medication management/psychotherapy  Amelia Jo, Medical Student 12/03/2021, 9:52 AM   Total Time Spent in Direct Patient Care:  I personally spent 35 minutes on the unit in direct patient care. The direct patient care time included face-to-face time with the patient, reviewing the patient's chart, communicating with other professionals, and coordinating care. Greater than 50% of this time was spent in counseling or coordinating care with the patient regarding goals of hospitalization, psycho-education, and discharge planning needs.  I personally was present and performed or re-performed the history, physical exam and medical decision-making activities of this service and have verified that the service and findings are accurately documented in the student's note, , as addended by me or notated below:  Delusions surrounding somatic symptoms is much less, pt has more insight into somatic symptoms and causes. Pt educated extensively about standing up at night and in the morning slowly and to monitor for dizziness.   Janine Limbo, MD Psychiatrist

## 2021-12-03 NOTE — Group Note (Unsigned)
Date:  12/03/2021 Time:  10:33 AM  Group Topic/Focus:  Orientation:   The focus of this group is to educate the patient on the purpose and policies of crisis stabilization and provide a format to answer questions about their admission.  The group details unit policies and expectations of patients while admitted.     Participation Level:  {[facility] PARTICIPATION HD:[id number]  Participation Quality:  {[facility] PARTICIPATION QUALITY:22265}  Affect:  {[facility] AFFECT:22266}  Cognitive:  {[facility] COGNITIVE:22267}  Insight: {[facility] Insight2:20797}  Engagement in Group:  {[facility] ENGAGEMENT IN JY:[id number]  Modes of Intervention:  {[facility] MODES OF INTERVENTION:22269}  Additional Comments:  ***  Nikyah Reep 12/03/2021, 10:33 AM

## 2021-12-03 NOTE — Progress Notes (Signed)
[facility] Group Notes:  (Nursing/MHT/Case Management/Adjunct)  Date:  12/03/2021  Time:  2015  Type of Therapy:   wrap up group  Participation Level:  Active  Participation Quality:  Appropriate, Attentive, and Sharing  Affect:  Depressed and Flat  Cognitive:  Alert  Insight:  Improving  Engagement in Group:  Engaged  Modes of Intervention:  Clarification, Education, and Support  Summary of Progress/Problems: Positive thinking and positive change were discussed.   Marcille Buffy 12/03/2021, 9:34 PM

## 2021-12-04 NOTE — Group Note (Unsigned)
LCSW Group Therapy Note   Group Date: 12/04/2021 Start Time: 1300 End Time: 1400   Type of Therapy and Topic:  Group Therapy:   Participation Level:  {[facility] PARTICIPATION LSLHT:34287}  Description of Group:   Therapeutic Goals:  1.     Summary of Patient Progress:    ***  Therapeutic Modalities:   Aram Beecham, Theresia Majors 12/04/2021  2:10 PM

## 2021-12-04 NOTE — [facility] Group Notes (Signed)
LCSW Group Therapy Note   Group Date: 12/04/2021 Start Time: 1300 End Time: 1400  Type of Therapy and Topic:  Group Therapy - Healthy vs Unhealthy Coping Skills  Participation Level:  Active   Description of Group The focus of this group was to determine what unhealthy coping techniques typically are used by group members and what healthy coping techniques would be helpful in coping with various problems. Patients were guided in becoming aware of the differences between healthy and unhealthy coping techniques. Patients were asked to identify 2-3 healthy coping skills they would like to learn to use more effectively.  Therapeutic Goals Patients learned that coping is what human beings do all day long to deal with various situations in their lives Patients defined and discussed healthy vs unhealthy coping techniques Patients identified their preferred coping techniques and identified whether these were healthy or unhealthy Patients determined 2-3 healthy coping skills they would like to become more familiar with and use more often. Patients provided support and ideas to each other   Summary of Patient Progress:  The Pt states that a coping skill they use is listening to music.  The Pt attended the group and remained there the entire time.  The Pt accepted all worksheets and participated in open discussion with their peers.  The Pt was appropriate and demonstrated understanding of the topic being discussed.    Therapeutic Modalities Cognitive Behavioral Therapy Motivational Interviewing

## 2021-12-04 NOTE — Progress Notes (Signed)
[facility] Progress Note  12/04/2021 9:42 AM Cathleen CortiKristen Ostrander  MRN:  [id number] Subjective:    Patient is a 26 year old female who denies having any previously diagnosed psychiatric history, who was admitted to the psychiatric unit for evaluation of extreme anxiety and paranoia.  [facility]   Psychiatric team made the following recommendations yesterday: -Start prazosin 1 mg QHS for nightmares and nocturnal panic episodes  -Increase fluoxetine from 20 mg to 30 mg once daily for MDD and GAD -Continue Abilify at 10mg  once daily -Continue trazodone 50 mg qhs -Increase propranolol from 10 mg tid to 20 mg tid - for anxiety and tachycardia   Patient endorsed having improved sleep without any of the anxiety-like attacks she has had the prior two nights. She believes she got a total of 6 hours. On interview this morning, however, she reports that she feels "weird" and "lightheaded", but she had trouble describing what weird felt like to her. She did endorse a new low BP this morning that was confirmed on chart review. She was started on a new dose of prazosin last night and on increased propanolol due to continued anxiety and a history of tachycardia on admission. She requests to stop the prazosin. She also had an episode of feeling "suffocated" this morning prior to our conversation that improved throughout our conversation following her morning medication. Similar to yesterday, she rates her depression at a 6/10 with 0 being the most depressed.   She reports having less itching with only a little bit when she was falling asleep the night before. She reports that she hasn't felt very anxious, but says she has had multiple of the aforementioned episodes of feeling like she's suffocating that is relieved by her medication. She continues to have no trouble with the food intolerance that she endorsed on admission. She is taking part in group and interacting well on the unit. She denies all  SI/HI/AVH.  Principal Problem: MDD (major depressive disorder), recurrent, severe, with psychosis (HCC) Diagnosis: Principal Problem:   MDD (major depressive disorder), recurrent, severe, with psychosis (HCC) Active Problems:   GAD (generalized anxiety disorder)    Past Psychiatric History:  Patient denies any previous psychiatric diagnosis or treatment.  Denies any history of suicide attempt or other self-harm behavior.  Past Medical History:  Past Medical History:  Diagnosis Date   Anxiety    GERD (gastroesophageal reflux disease)     Past Surgical History:  Procedure Laterality Date   LAPAROSCOPY  02/24/2012   Procedure: LAPAROSCOPY OPERATIVE;  Surgeon: Geryl RankinsEvelyn Varnado, MD;  Location: [facility];  Service: Gynecology;  Laterality: N/A;  Drainage of Left Ovarian Cyst   OVARIAN CYST REMOVAL  02/24/2012   Procedure: OVARIAN CYSTECTOMY;  Surgeon: Geryl RankinsEvelyn Varnado, MD;  Location: [facility];  Service: Gynecology;  Laterality: Right;   Family History:  Family History  Problem Relation Age of Onset   Allergic rhinitis Mother    Diabetes Father    Heart attack Father 1564   Allergic rhinitis Brother    Hypertension Other    Family Psychiatric  History: None known.  Social History:  Social History   Substance and Sexual Activity  Alcohol Use No     Social History   Substance and Sexual Activity  Drug Use No    Social History   Socioeconomic History   Marital status: Single    Spouse name: Not on file   Number of children: 0   Years of education: Not on file  Highest education level: Not on file  Occupational History   Not on file  Tobacco Use   Smoking status: Never    Passive exposure: Never   Smokeless tobacco: Never  Vaping Use   Vaping Use: Never used  Substance and Sexual Activity   Alcohol use: No   Drug use: No   Sexual activity: Not Currently  Other Topics Concern   Not on file  Social History Narrative   Not on file   Social Determinants of Health    Financial Resource Strain: Not on file  Food Insecurity: Not on file  Transportation Needs: Not on file  Physical Activity: Not on file  Stress: Not on file  Social Connections: Not on file   Additional Social History:       Sleep: Poor  Appetite:  Good  Current Medications: Current Facility-Administered Medications  Medication Dose Route Frequency Provider Last Rate Last Admin   acetaminophen (TYLENOL) tablet 650 mg  650 mg Oral Q6H PRN Carlyn Reichert, MD       alum & mag hydroxide-simeth (MAALOX/MYLANTA) 200-200-20 MG/5ML suspension 30 mL  30 mL Oral Q4H PRN Carlyn Reichert, MD       ARIPiprazole (ABILIFY) tablet 10 mg  10 mg Oral Daily Massengill, Harrold Donath, MD   10 mg at 12/04/21 0810   diphenhydrAMINE (BENADRYL) capsule 25 mg  25 mg Oral Q8H PRN Nira Conn A, NP   25 mg at 12/03/21 2109   feeding supplement (ENSURE ENLIVE / ENSURE PLUS) liquid 237 mL  237 mL Oral BID BM Hill, Shelbie Hutching, MD   237 mL at 12/02/21 1428   FLUoxetine (PROZAC) capsule 30 mg  30 mg Oral Daily Massengill, Harrold Donath, MD   30 mg at 12/04/21 2330   hydrocerin (EUCERIN) cream   Topical BID Phineas Inches, MD   Given at 12/04/21 0762   hydrOXYzine (ATARAX) tablet 25 mg  25 mg Oral Q8H PRN Carlyn Reichert, MD   25 mg at 12/02/21 1003   loratadine (CLARITIN) tablet 10 mg  10 mg Oral QPM Carlyn Reichert, MD   10 mg at 12/03/21 1718   magnesium hydroxide (MILK OF MAGNESIA) suspension 30 mL  30 mL Oral Daily PRN Carlyn Reichert, MD       norethindrone (AYGESTIN) tablet 5 mg  5 mg Oral Daily Massengill, Nathan, MD   5 mg at 12/04/21 0807   pantoprazole (PROTONIX) EC tablet 40 mg  40 mg Oral Daily Carlyn Reichert, MD   40 mg at 12/04/21 2633   prazosin (MINIPRESS) capsule 1 mg  1 mg Oral QHS Massengill, Nathan, MD   1 mg at 12/03/21 2109   propranolol (INDERAL) tablet 20 mg  20 mg Oral Q8H Massengill, Nathan, MD   20 mg at 12/03/21 2211   traZODone (DESYREL) tablet 50 mg  50 mg Oral QHS Massengill, Harrold Donath,  MD   50 mg at 12/03/21 2111    Lab Results:  No results found for this or any previous visit (from the past 48 hour(s)).   Blood Alcohol level:  Lab Results  Component Value Date   ETH <10 11/30/2021    Metabolic Disorder Labs: Lab Results  Component Value Date   HGBA1C 4.7 (L) 11/30/2021   MPG 88.19 11/30/2021   No results found for: PROLACTIN Lab Results  Component Value Date   CHOL 160 11/30/2021   TRIG 44 11/30/2021   HDL 26 (L) 11/30/2021   CHOLHDL 6.2 11/30/2021   VLDL 9 11/30/2021   LDLCALC  125 (H) 11/30/2021    Physical Findings: AIMS: Facial and Oral Movements Muscles of Facial Expression: None, normal Lips and Perioral Area: None, normal Jaw: None, normal Tongue: None, normal,Extremity Movements Upper (arms, wrists, hands, fingers): None, normal Lower (legs, knees, ankles, toes): None, normal, Trunk Movements Neck, shoulders, hips: None, normal, Overall Severity Severity of abnormal movements (highest score from questions above): None, normal Incapacitation due to abnormal movements: None, normal Patient's awareness of abnormal movements (rate only patient's report): No Awareness, Dental Status Current problems with teeth and/or dentures?: No Does patient usually wear dentures?: No  CIWA:    COWS:      Musculoskeletal: Strength & Muscle Tone: within normal limits Gait & Station: normal Patient leans: N/A  Psychiatric Specialty Exam:  Presentation  General Appearance: Disheveled  Eye Contact:Good  Speech:Clear and Coherent; Normal Rate  Speech Volume:Decreased  Handedness:Right   Mood and Affect  Mood:Anxious; Depressed ("weird but better")  Affect:Congruent; Depressed; Flat (Improved from yesterday)   Thought Process  Thought Processes:Goal Directed; Coherent  Descriptions of Associations:Intact  Orientation:Full (Time, Place and Person)  Thought Content:Logical  History of Schizophrenia/Schizoaffective disorder:No  Duration  of Psychotic Symptoms:Less than six months  Hallucinations:Hallucinations: None  Ideas of Reference:None  Suicidal Thoughts:Suicidal Thoughts: No  Homicidal Thoughts:Homicidal Thoughts: No   Sensorium  Memory:Immediate Good; Recent Good; Remote Good  Judgment:Fair  Insight:lacking   Executive Functions  Concentration:Fair  Attention Span:Fair  Recall:Good  Fund of Knowledge:Good  Language:Good   Psychomotor Activity  Psychomotor Activity:Psychomotor Activity: Normal   Assets  Assets:Communication Skills; Desire for Improvement; Housing; Social Support; Resilience   Sleep  Sleep:Sleep: Fair (improved from yesterday) Number of Hours of Sleep: 6    Physical Exam: Physical Exam Vitals and nursing note reviewed.  Constitutional:      General: She is not in acute distress.    Appearance: Normal appearance. She is not ill-appearing, toxic-appearing or diaphoretic.  HENT:     Head: Normocephalic and atraumatic.  Eyes:     General: No scleral icterus. Pulmonary:     Effort: Pulmonary effort is normal.  Neurological:     Mental Status: She is alert and oriented to person, place, and time.     Gait: Gait normal.   Review of Systems  Constitutional:  Negative for chills, diaphoresis, fever, malaise/fatigue and weight loss.  Respiratory:  Negative for cough.   Cardiovascular:  Negative for chest pain.  Gastrointestinal:  Negative for abdominal pain, diarrhea and vomiting.  Musculoskeletal:  Negative for joint pain and myalgias.  Neurological:        Reports being "lightheaded"  Psychiatric/Behavioral:  Positive for depression. Negative for hallucinations, memory loss, substance abuse and suicidal ideas. The patient is nervous/anxious and has insomnia.    Blood pressure 98/64, pulse (!) 104, temperature 98.2 F (36.8 C), temperature source Oral, resp. rate 18, height  (1.727 m), weight 92.1 kg, SpO2 100 %. Body mass index is 30.87 kg/m.   Treatment  Plan Summary: Daily contact with patient to assess and evaluate symptoms and progress in treatment  ASSESSMENT:   Diagnoses / Active Problems: -Major depressive disorder severe recurrent with psychotic features.  (Per collateral in [facility] note, brother states that patient has been severely depressed, which has resulted in her medical concerns.  On my assessment, it is unclear if the paranoia surrounding her bodily symptoms are secondary to a mood episode, or are independent for mood episode.  Differential diagnosis list could also include delusional disorder versus premorbid psychotic disorder.) -Somatic  symptom disorder -GAD     PLAN: Safety and Monitoring:             --  Voluntary admission to inpatient psychiatric unit for safety, stabilization and treatment             -- Daily contact with patient to assess and evaluate symptoms and progress in treatment             -- Patient's case to be discussed in multi-disciplinary team meeting             -- Observation Level : q15 minute checks             -- Vital signs:  q12 hours             -- Precautions: suicide, elopement, and assault   2. Psychiatric Diagnoses and Treatment:    -Discontinue prazosin 1 mg QHS due to new low BP this AM and patient request. This has not been an issue prior. Prazosin was initially added to aid with her sleep. She received the increased propranolol dose multiple times the night before, so we will d/c prazosin  and continue to monitor BP prior to making further changes to propranolol.  -Continue fluoxetine 30 mg once daily for MDD and GAD -Continue Abilify at  once daily -Continue trazodone 50 mg qhs -Continue propranolol 20 mg tid - for anxiety and tachycardia.   --  The risks/benefits/side-effects/alternatives to this medication were discussed in detail with the patient and time was given for questions. The patient consents to medication trial.                -- Metabolic profile and EKG  monitoring obtained while on an atypical antipsychotic (BMI: Lipid Panel: HbgA1c: QTc:) 447             -- Encouraged patient to participate in unit milieu and in scheduled group therapies              -- Short Term Goals: Ability to identify changes in lifestyle to reduce recurrence of condition will improve, Ability to verbalize feelings will improve, Ability to disclose and discuss suicidal ideas, Ability to demonstrate self-control will improve, Ability to identify and develop effective coping behaviors will improve, Ability to maintain clinical measurements within normal limits will improve, and Compliance with prescribed medications will improve             -- Long Term Goals: Improvement in symptoms so as ready for discharge                3. Medical Issues Being Addressed:              Tobacco Use Disorder             -- patient denied smoking at time of admission.   4. Discharge Planning:              -- Social work and case management to assist with discharge planning and identification of hospital follow-up needs prior to discharge             -- Estimated LOS: 5-7 days             -- Discharge Concerns: Need to establish a safety plan; Medication compliance and effectiveness             -- Discharge Goals: Return home with outpatient referrals for mental health follow-up including medication management/psychotherapy  Louanne Skye, Medical Student 12/04/2021, 9:42 AM  Total Time Spent in Direct Patient Care:  I personally spent 35 minutes on the unit in direct patient care. The direct patient care time included face-to-face time with the patient, reviewing the patient's chart, communicating with other professionals, and coordinating care. Greater than 50% of this time was spent in counseling or coordinating care with the patient regarding goals of hospitalization, psycho-education, and discharge planning needs.  I personally was present and performed or re-performed the history,  physical exam and medical decision-making activities of this service and have verified that the service and findings are accurately documented in the student's note, , as addended by me or notated below: Upon evaluation, patient noted to be much less disturbed and preoccupied by somatic complaints, in fact she reports feeling : better about it", reported feeling better " overall" denied SE to meds except lightheadedness which I d/w her will d/c prazosin and continue to monitor BP.  Phineas Inches, MD Psychiatrist

## 2021-12-04 NOTE — Group Note (Signed)
Recreation Therapy Group Note   Group Topic:Stress Management  Group Date: 12/04/2021 Start Time: 0930 End Time: 0945 Facilitators: Caroll Rancher, [place] Location: [address] Dayroom   Goal Area(s) Addresses:  Patient will identify positive stress management techniques. Patient will identify benefits of using stress management post d/c.  Group Description:  Meditation.  LRT explained the stress management technique of meditation to patients.  LRT then played a meditation that focused on bringing positive energy into your day through positive self talk.  Patients were to listen as meditation played and quietly repeat the affirmations to themselves to get the full affect of the exercise.  At conclusion, LRT made suggestions of where patients could find more meditations and other stress management techniques.   Affect/Mood: Appropriate   Participation Level: Engaged   Participation Quality: Independent   Behavior: Appropriate   Speech/Thought Process: Focused   Insight: Good   Judgement: Good   Modes of Intervention: Soft Music   Patient Response to Interventions:  Engaged   Education Outcome:  Acknowledges education   Clinical Observations/Individualized Feedback: Pt attended and participated in group session.     Plan: Continue to engage patient in RT group sessions 2-3x/week.   Caroll Rancher, Antonietta Jewel 12/04/2021 12:41 PM

## 2021-12-04 NOTE — Group Note (Unsigned)
Date:  12/04/2021 Time:  9:55 AM  Group Topic/Focus:  Orientation:   The focus of this group is to educate the patient on the purpose and policies of crisis stabilization and provide a format to answer questions about their admission.  The group details unit policies and expectations of patients while admitted.     Participation Level:  {BHH PARTICIPATION UKGUR:42706}  Participation Quality:  {B[facility] PARTICIPATION QUALITY:22265}  Affect:  {[facility] AFFECT:22266}  Cognitive:  {[facility] COGNITIVE:22267}  Insight: {BHH Insight2:20797}  Engagement in Group:  {BHH ENGAGEMENT IN CBJSE:83151}  Modes of Intervention:  {B[facility] MODES OF INTERVENTION:22269}  Additional Comments:  ***  Emiliya Chretien 12/04/2021, 9:55 AM

## 2021-12-04 NOTE — Progress Notes (Signed)
   12/04/21 2141  Psych Admission Type (Psych Patients Only)  Admission Status Voluntary  Psychosocial Assessment  Patient Complaints None  Eye Contact Brief  Facial Expression Flat  Affect Flat  Speech Soft  Interaction Minimal;No initiation  Motor Activity Slow  Appearance/Hygiene In hospital gown  Behavior Characteristics Appropriate to situation;Cooperative  Mood Pleasant  Thought Process  Coherency Circumstantial  Content Paranoia  Delusions Somatic  Perception Derealization  Hallucination None reported or observed  Judgment Poor  Confusion None  Danger to Self  Current suicidal ideation? Denies

## 2021-12-04 NOTE — Progress Notes (Signed)
     12/03/21 2109  Psych Admission Type (Psych Patients Only)  Admission Status Voluntary  Psychosocial Assessment  Patient Complaints Anxiety  Eye Contact Fair  Facial Expression Anxious;Worried  Affect Anxious  Engineer, building services Cooperative  Mood Anxious  Thought Process  Coherency Circumstantial  Content Paranoia  Delusions Somatic  Perception Derealization  Hallucination None reported or observed  Judgment Poor  Confusion None  Danger to Self  Current suicidal ideation? Denies  Self-Injurious Behavior No self-injurious ideation or behavior indicators observed or expressed   Agreement Not to Harm Self Yes  Description of Agreement verbal  Danger to Others  Danger to Others None reported or observed

## 2021-12-04 NOTE — Progress Notes (Signed)
Pt denies SI/HI/AVH.  Pt sitting on bench in room hunched over.  RN inquired why pt did not want to sleep in her bed.  Pt said she felt more comfortable on the bench.  Pt is guarded with responses to questions.  Pt took medications without incident.  RN will continue to monitor pt's progress and provide support as needed.

## 2021-12-04 NOTE — Plan of Care (Signed)
  Problem: Activity: Goal: Interest or engagement in activities will improve Outcome: Progressing   Problem: Education: Goal: Emotional status will improve Outcome: Not Progressing Goal: Mental status will improve Outcome: Not Progressing   

## 2021-12-05 MED ORDER — TRAZODONE HCL 50 MG PO TABS
50.0000 mg | ORAL_TABLET | Freq: Every evening | ORAL | Status: DC | PRN
  Administered 2021-12-05: 50 mg via ORAL

## 2021-12-05 MED ORDER — WHITE PETROLATUM EX OINT
TOPICAL_OINTMENT | CUTANEOUS | Status: AC
  Administered 2021-12-05: 1
  Filled 2021-12-05: qty 5

## 2021-12-05 NOTE — Group Note (Unsigned)
Date:  12/05/2021 Time:  11:11 AM  Group Topic/Focus:  Orientation:   The focus of this group is to educate the patient on the purpose and policies of crisis stabilization and provide a format to answer questions about their admission.  The group details unit policies and expectations of patients while admitted.     Participation Level:  {B[facility] PARTICIPATION LEVEL:22264}  Participation Quality:  {B[facility] PARTICIPATION QUALITY:22265}  Affect:  {B[facility] AFFECT:22266}  Cognitive:  {[facility] COGNITIVE:22267}  Insight: {BHH Insight2:20797}  Engagement in Group:  {[facility] ENGAGEMENT IN GROUP:22268}  Modes of Intervention:  {B[facility] MODES OF INTERVENTION:22269}  Additional Comments:  ***  Jibran Crookshanks D Shakira Los 12/05/2021, 11:11 AM  

## 2021-12-05 NOTE — [facility] Group Notes (Signed)
Psychoeducational Group Note  Date: 12/05/2021 Time: 0900-1000    Goal Setting   Purpose of Group: This group helps to provide patients with the steps of setting a goal that is specific, measurable, attainable, realistic and time specific. A discussion on how we keep ourselves stuck with negative self talk. Homework given for Patients to write 30 positive attributes about themselves.    Participation Level:  did not attend  Carol Schwartz A 

## 2021-12-05 NOTE — Progress Notes (Addendum)
[facility] MD Progress Note  12/05/2021 4:53 PM Carol Schwartz  MRN:  [id number]  Subjective:  "Tired and was not able to sleep very well. My prazosin was discontinued and I do not have any night mares."   Brief History: Patient is a 26 year old female who denies having any previously diagnosed psychiatric history, who was admitted to the psychiatric unit for evaluation of extreme anxiety and paranoia.  Patient was medically cleared at [facility].   Psychiatric team made the following recommendations yesterday: -Discontinued Prazosin due to pt request. -Continue Prozac 30 mg once daily for MDD and GAD -Continue Abilify at  once daily -Continue trazodone 50 mg qhs -Continue on Inderal 20 mg tid - for anxiety and tachycardia   Patient endorsed having improved sleep without any of the anxiety-like attacks she has had the prior two nights. She believes she got a total of 5 hours. On interview this morning, however, she reports that she feels "weird" and "lightheaded", but she had trouble describing what weird felt like to her. She did endorse a new low BP this morning that was confirmed on chart review, BP today 105/68, P 109.  Continues on increased propanolol due to continued anxiety and a history of tachycardia on admission. She rates her depression at a 64/10  with 0 being the most depressed.   She reports that she hasn't felt very anxious, rated anxiety as 2/10 which is relieved by her medication. No trouble with the food intolerance that she endorsed on admission. Actively participating in group and interacting well on the unit. She denies all SI/HI/AVH. Patient signed a 72 hours form at 1448 for discharge.  Principal Problem: MDD (major depressive disorder), recurrent, severe, with psychosis (HCC) Diagnosis: Principal Problem:   MDD (major depressive disorder), recurrent, severe, with psychosis (HCC) Active Problems:   GAD (generalized anxiety disorder)  Past Psychiatric History:  Patient denies  any previous psychiatric diagnosis or treatment.  Denies any history of suicide attempt or other self-harm behavior.  Past Medical History:  Past Medical History:  Diagnosis Date   Anxiety    GERD (gastroesophageal reflux disease)     Past Surgical History:  Procedure Laterality Date   LAPAROSCOPY  02/24/2012   Procedure: LAPAROSCOPY OPERATIVE;  Surgeon: Geryl Rankins, MD;  Location: [facility];  Service: Gynecology;  Laterality: N/A;  Drainage of Left Ovarian Cyst   OVARIAN CYST REMOVAL  02/24/2012   Procedure: OVARIAN CYSTECTOMY;  Surgeon: Geryl Rankins, MD;  Location: [facility];  Service: Gynecology;  Laterality: Right;   Family History:  Family History  Problem Relation Age of Onset   Allergic rhinitis Mother    Diabetes Father    Heart attack Father 2   Allergic rhinitis Brother    Hypertension Other    Family Psychiatric  History: None known.  Social History:  Social History   Substance and Sexual Activity  Alcohol Use No     Social History   Substance and Sexual Activity  Drug Use No    Social History   Socioeconomic History   Marital status: Single    Spouse name: Not on file   Number of children: 0   Years of education: Not on file   Highest education level: Not on file  Occupational History   Not on file  Tobacco Use   Smoking status: Never    Passive exposure: Never   Smokeless tobacco: Never  Vaping Use   Vaping Use: Never used  Substance and Sexual Activity   Alcohol use:  No   Drug use: No   Sexual activity: Not Currently  Other Topics Concern   Not on file  Social History Narrative   Not on file   Social Determinants of Health   Financial Resource Strain: Not on file  Food Insecurity: Not on file  Transportation Needs: Not on file  Physical Activity: Not on file  Stress: Not on file  Social Connections: Not on file   Additional Social History:       Sleep: Poor  Appetite:  Good  Current Medications: Current Facility-Administered  Medications  Medication Dose Route Frequency Provider Last Rate Last Admin   acetaminophen (TYLENOL) tablet 650 mg  650 mg Oral Q6H PRN Carlyn Reichert, MD       alum & mag hydroxide-simeth (MAALOX/MYLANTA) 200-200-20 MG/5ML suspension 30 mL  30 mL Oral Q4H PRN Carlyn Reichert, MD       ARIPiprazole (ABILIFY) tablet 10 mg  10 mg Oral Daily Massengill, Harrold Donath, MD   10 mg at 12/05/21 0807   diphenhydrAMINE (BENADRYL) capsule 25 mg  25 mg Oral Q8H PRN Nira Conn A, NP   25 mg at 12/03/21 2109   feeding supplement (ENSURE ENLIVE / ENSURE PLUS) liquid 237 mL  237 mL Oral BID BM Hill, Shelbie Hutching, MD   237 mL at 12/02/21 1428   FLUoxetine (PROZAC) capsule 30 mg  30 mg Oral Daily Massengill, Harrold Donath, MD   30 mg at 12/05/21 1017   hydrocerin (EUCERIN) cream   Topical BID Phineas Inches, MD   Given at 12/05/21 1640   hydrOXYzine (ATARAX) tablet 25 mg  25 mg Oral Q8H PRN Carlyn Reichert, MD   25 mg at 12/02/21 1003   loratadine (CLARITIN) tablet 10 mg  10 mg Oral QPM Carlyn Reichert, MD   10 mg at 12/04/21 1810   magnesium hydroxide (MILK OF MAGNESIA) suspension 30 mL  30 mL Oral Daily PRN Carlyn Reichert, MD       norethindrone (AYGESTIN) tablet 5 mg  5 mg Oral Daily Massengill, Nathan, MD   5 mg at 12/05/21 0807   pantoprazole (PROTONIX) EC tablet 40 mg  40 mg Oral Daily Carlyn Reichert, MD   40 mg at 12/05/21 5102   propranolol (INDERAL) tablet 20 mg  20 mg Oral Q8H Massengill, Nathan, MD   20 mg at 12/05/21 1409   traZODone (DESYREL) tablet 50 mg  50 mg Oral QHS Massengill, Harrold Donath, MD   50 mg at 12/04/21 2129    Lab Results:  No results found for this or any previous visit (from the past 48 hour(s)).   Blood Alcohol level:  Lab Results  Component Value Date   ETH <10 11/30/2021    Metabolic Disorder Labs: Lab Results  Component Value Date   HGBA1C 4.7 (L) 11/30/2021   MPG 88.19 11/30/2021   No results found for: PROLACTIN Lab Results  Component Value Date   CHOL 160 11/30/2021    TRIG 44 11/30/2021   HDL 26 (L) 11/30/2021   CHOLHDL 6.2 11/30/2021   VLDL 9 11/30/2021   LDLCALC 125 (H) 11/30/2021    Physical Findings: AIMS: Facial and Oral Movements Muscles of Facial Expression: None, normal Lips and Perioral Area: None, normal Jaw: None, normal Tongue: None, normal,Extremity Movements Upper (arms, wrists, hands, fingers): None, normal Lower (legs, knees, ankles, toes): None, normal, Trunk Movements Neck, shoulders, hips: None, normal, Overall Severity Severity of abnormal movements (highest score from questions above): None, normal Incapacitation due to abnormal movements: None, normal  Patient's awareness of abnormal movements (rate only patient's report): No Awareness, Dental Status Current problems with teeth and/or dentures?: No Does patient usually wear dentures?: No  CIWA:    COWS:      Musculoskeletal: Strength & Muscle Tone: within normal limits Gait & Station: normal Patient leans: N/A  Psychiatric Specialty Exam:  Presentation  General Appearance: Appropriate for Environment; Casual; Fairly Groomed  Eye Contact:Good  Speech:Clear and Coherent; Normal Rate  Speech Volume:Normal  Handedness:Right   Mood and Affect  Mood:Anxious; Depressed  Affect:Congruent; Depressed   Thought Process  Thought Processes:Coherent; Goal Directed; Linear  Descriptions of Associations:Intact  Orientation:Full (Time, Place and Person)  Thought Content:Logical; WDL  History of Schizophrenia/Schizoaffective disorder:No  Duration of Psychotic Symptoms:N/A  Hallucinations:Hallucinations: None  Ideas of Reference:None  Suicidal Thoughts:Suicidal Thoughts: No  Homicidal Thoughts:Homicidal Thoughts: No  Sensorium  Memory:Immediate Fair; Recent Fair; Remote Fair  Judgment:Fair  Insight:lacking  Executive Functions  Concentration:Fair  Attention Span:Fair  Recall:Fair  Fund of Knowledge:Fair  Language:Good  Psychomotor  Activity  Psychomotor Activity:Psychomotor Activity: Normal  Assets  Assets:Communication Skills; Physical Health; Social Support  Sleep  Sleep:Sleep: Good Number of Hours of Sleep: 5  Physical Exam: Physical Exam Vitals and nursing note reviewed.  Constitutional:      General: She is not in acute distress.    Appearance: Normal appearance. She is not ill-appearing, toxic-appearing or diaphoretic.  HENT:     Head: Normocephalic and atraumatic.     Right Ear: External ear normal.     Left Ear: External ear normal.     Mouth/Throat:     Mouth: Mucous membranes are moist.     Pharynx: Oropharynx is clear.  Eyes:     General: No scleral icterus.    Extraocular Movements: Extraocular movements intact.     Conjunctiva/sclera: Conjunctivae normal.     Pupils: Pupils are equal, round, and reactive to light.  Cardiovascular:     Rate and Rhythm: Tachycardia present.     Comments: P 109, Nursing staff to recheck Pulmonary:     Effort: Pulmonary effort is normal.  Abdominal:     Palpations: Abdomen is soft.  Genitourinary:    Comments: deferred Musculoskeletal:        General: Normal range of motion.     Cervical back: Normal range of motion and neck supple.  Skin:    General: Skin is warm.  Neurological:     General: No focal deficit present.     Mental Status: She is alert and oriented to person, place, and time.     Gait: Gait normal.  Psychiatric:        Behavior: Behavior normal.   Review of Systems  Constitutional: Negative.  Negative for chills, diaphoresis, fever, malaise/fatigue and weight loss.  HENT: Negative.  Negative for hearing loss.   Eyes: Negative.  Negative for blurred vision and double vision.  Respiratory:  Negative for cough, sputum production, shortness of breath and wheezing.   Cardiovascular: Negative.  Negative for chest pain and palpitations.       P 109, Nursing staff to recheck   Gastrointestinal: Negative.  Negative for abdominal pain,  diarrhea and vomiting.  Genitourinary:  Negative for dysuria, flank pain, frequency, hematuria and urgency.  Musculoskeletal:  Negative for joint pain and myalgias.  Skin: Negative.  Negative for itching and rash.  Neurological: Negative.  Negative for dizziness, tingling, tremors, sensory change, speech change, focal weakness, seizures, loss of consciousness, weakness and headaches.  Reports being "lightheaded"  Endo/Heme/Allergies: Negative.  Negative for environmental allergies and polydipsia. Does not bruise/bleed easily.         Shellfish Allergy Shellfish Allergy   Not Specified  12/01/2021    Psychiatric/Behavioral:  Positive for depression. Negative for hallucinations, memory loss, substance abuse and suicidal ideas. The patient is nervous/anxious and has insomnia.    Blood pressure 105/68, pulse (!) 109, temperature 98.3 F (36.8 C), temperature source Oral, resp. rate 18, height 5\' 8"  (1.727 m), weight 92.1 kg, SpO2 96 %. Body mass index is 30.87 kg/m.   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  (Per collateral in [facility] note, brother states that patient has been severely depressed, which has resulted in her medical concerns.  On my assessment, it is unclear if the paranoia surrounding her bodily symptoms are secondary to a mood episode, or are independent for mood episode.  Differential diagnosis list could also include delusional disorder versus premorbid psychotic disorder.) -Somatic symptom disorder -GAD     PLAN: Safety and Monitoring:             --  Voluntary admission to inpatient psychiatric unit for safety, stabilization and treatment             -- Daily contact with patient to assess and evaluate symptoms and progress in treatment             -- Patient's case to be discussed in  multi-disciplinary team meeting             -- Observation Level : q15 minute checks             -- Vital signs:  q12 hours             -- Precautions: suicide, elopement, and assault   2. Psychiatric Diagnoses and Treatment:  -Continue to hold Prazosin -Continue fluoxetine 30 mg once daily for MDD and GAD -Continue Abilify at 10mg  once daily -Change to PRN trazodone 50 mg qhs but if BP remain low will need to discontinue medication -Continue propranolol 20 mg tid - for anxiety and tachycardia but monitoring BP                        3. Medical Issues Being Addressed:              Tobacco Use Disorder             -- patient denied smoking at time of admission.   4. Discharge Planning:              -- Social work and case management to assist with discharge planning and identification of hospital follow-up needs prior to discharge             -- Estimated LOS: 5-7 days             -- Discharge Concerns: Need to establish a safety plan; Medication compliance and effectiveness             -- Discharge Goals: Return home with outpatient referrals for mental health follow-up including medication management/psychotherapy  , FNP 12/05/2021, 4:53 PM  Psychiatrist  Patient ID: Cecilie Lowers, female   DOB: 08/01/1995, 26 y.o.   MRN: 1995

## 2021-12-05 NOTE — Progress Notes (Signed)
   12/05/21 0800  Psych Admission Type (Psych Patients Only)  Admission Status Voluntary  Psychosocial Assessment  Patient Complaints None  Eye Contact Brief  Facial Expression Flat  Affect Flat  Speech Soft  Interaction Minimal;No initiation  Motor Activity Slow  Appearance/Hygiene Disheveled  Behavior Characteristics Appropriate to situation;Cooperative  Mood Pleasant  Thought Process  Coherency Circumstantial  Content Paranoia  Delusions Somatic  Perception Derealization  Hallucination None reported or observed  Judgment Poor  Confusion None  Danger to Self  Current suicidal ideation? Denies  Self-Injurious Behavior No self-injurious ideation or behavior indicators observed or expressed   Agreement Not to Harm Self Yes  Description of Agreement Verbal  Danger to Others  Danger to Others None reported or observed

## 2021-12-05 NOTE — Progress Notes (Signed)
Adult Psychoeducational Group Note  Date:  12/05/2021 Time:  8:51 PM  Group Topic/Focus:  Wrap-Up Group:   The focus of this group is to help patients review their daily goal of treatment and discuss progress on daily workbooks.  Participation Level:  Active  Participation Quality:  Appropriate and Attentive  Affect:  Appropriate  Cognitive:  Alert and Appropriate  Insight: Appropriate  Engagement in Group:  Engaged  Modes of Intervention:  Discussion  Additional Comments:   Pt was reserved during group discussion. Pt states that she had an "okay" day and rates her day at a 7/10. Pt states that she walked around at the gym and has played games. Pt states that she has communicated with her friends and family, along with her care team. Pt denies everything.  Carol Schwartz 12/05/2021, 8:51 PM

## 2021-12-05 NOTE — Group Note (Signed)
LCSW Group Therapy Note  Group Date: 12/05/2021 Start Time: 1000 End Time: 1100   Type of Therapy and Topic:  Group Therapy: Anger Cues and Responses  Participation Level:  Active   Description of Group:   In this group, patients learned how to recognize the physical, cognitive, emotional, and behavioral responses they have to anger-provoking situations.  They identified a recent time they became angry and how they reacted.  They analyzed how their reaction was possibly beneficial and how it was possibly unhelpful.  The group discussed a variety of healthier coping skills that could help with such a situation in the future.  Focus was placed on how helpful it is to recognize the underlying emotions to our anger, because working on those can lead to a more permanent solution as well as our ability to focus on the important rather than the urgent.  Therapeutic Goals: Patients will remember their last incident of anger and how they felt emotionally and physically, what their thoughts were at the time, and how they behaved. Patients will identify how their behavior at that time worked for them, as well as how it worked against them. Patients will explore possible new behaviors to use in future anger situations. Patients will learn that anger itself is normal and cannot be eliminated, and that healthier reactions can assist with resolving conflict rather than worsening situations.  Summary of Patient Progress:  Carol Schwartz was active during the group. She shared a recent occurrence wherein feeling physically ill led to anger. She demonstrated good insight into the subject matter, was respectful of peers, and participated throughout the entire session.  Therapeutic Modalities:   Cognitive Behavioral Therapy    Lynnell Chad, LCSWA 12/05/2021  1:03 PM

## 2021-12-05 NOTE — [facility] Group Notes (Signed)
.  Psychoeducational Group Note    Date:  5/27//23 Time: 1300-1400    Purpose of Group: . The group focus' on teaching patients on how to identify their needs and their Life Skills:  A group where two lists are made. What people need and what are things that we do that are unhealthy. The lists are developed by the patients and it is explained that we often do the actions that are not healthy to get our list of needs met.  Goal:: to develop the coping skills needed to get their needs met  Participation Level:  Active  Participation Quality:  Appropriate  Affect:  Appropriate  Cognitive:  Oriented  Insight:  Improving  Engagement in Group:  Engaged  Additional Comments: Rates her energy at a 10/10. Participated fully in the group.  Carol Schwartz

## 2021-12-06 MED ORDER — WHITE PETROLATUM EX OINT
TOPICAL_OINTMENT | CUTANEOUS | Status: AC
  Administered 2021-12-06: 1
  Filled 2021-12-06: qty 5

## 2021-12-06 NOTE — Progress Notes (Signed)
Adult Psychoeducational Group Note  Date:  12/06/2021 Time:  11:24 PM  Group Topic/Focus:  Wrap-Up Group:   The focus of this group is to help patients review their daily goal of treatment and discuss progress on daily workbooks.  Participation Level:  Active  Participation Quality:  Appropriate  Affect:  Appropriate  Cognitive:  Appropriate  Insight: Appropriate  Engagement in Group:  Developing/Improving  Modes of Intervention:  Discussion  Additional Comments:  Pt stated her goal for today was to focus on her treatment plan and to work on her anxiety issues. Pt stated she accomplished her goals today. Pt stated she did not get a chance to speak with a doctor or with a social worker about her care today. Pt rated her overall day a 7 out of 10. Pt stated she was able to contact her mother today which improved her overall day. Pt stated she felt better about herself tonight. Pt stated she was able to attend dinner tonight. Pt stated she took all medications provided today. Pt stated her appetite was pretty good today. Pt rated her sleep last night was fair. Pt stated the goal tonight was to get some rest. Pt stated she had no physical pain tonight. Pt deny visual hallucinations and auditory issues tonight. Pt denies thoughts of harming herself or others. Pt stated she would alert staff if anything changed.  Carol Schwartz 12/06/2021, 11:24 PM

## 2021-12-06 NOTE — Progress Notes (Signed)
   12/05/21 2100  Psych Admission Type (Psych Patients Only)  Admission Status Voluntary  Psychosocial Assessment  Patient Complaints None  Eye Contact Brief  Facial Expression Flat  Affect Flat  Speech Soft  Interaction Minimal;No initiation  Motor Activity Slow  Appearance/Hygiene In hospital gown  Behavior Characteristics Appropriate to situation  Mood Pleasant  Thought Process  Coherency Circumstantial  Content Paranoia  Delusions Somatic  Perception Derealization  Hallucination None reported or observed  Judgment Poor  Confusion None  Danger to Self  Current suicidal ideation? Denies

## 2021-12-06 NOTE — [facility] Group Notes (Signed)
Adult Psychoeducational Group Not Date:  12/06/2021 Time:  1540-0867 Group Topic/Focus: PROGRESSIVE RELAXATION. A group where deep breathing is taught and tensing and relaxation muscle groups is used. Imagery is used as well.  Pts are asked to imagine 3 pillars that hold them up when they are not able to hold themselves up and to share that with the group.  Participation Level:  Active  Participation Quality:  Appropriate  Affect:  Appropriate  Cognitive:  Oriented  Insight: Improving  Engagement in Group:  Engaged  Modes of Intervention:  Activity, Discussion, Education, and Support  Additional Comments:  rates her energy at a 5/10. State her mom and her two siblings hold her up.  Dione Housekeeper

## 2021-12-06 NOTE — Group Note (Signed)
[facility] LCSW Group Therapy Note  12/06/2021   10:00-11:00AM  Type of Therapy and Topic:  Group Therapy:  Unhealthy versus Healthy Supports, Which Am I?  Participation Level:  Active   Description of Group:  Patients in this group were introduced to the concept that additional supports including self-support are an essential part of recovery.  Initially a discussion was held about the differences between healthy versus unhealthy supports.  Patients were asked to share what unhealthy supports in their lives need to be addressed, as well as what additional healthy supports could be added for greater help in reaching their goals.   A song entitled "My Own Hero" was played and a group discussion ensued in which patients stated they could relate to the song and it inspired them to realize they have be willing to help themselves in order to succeed, because other people cannot achieve sobriety or stability for them.  We discussed adding a variety of healthy supports to address the various needs in patient lives, including becoming more self-supportive.  A song was played called "I Am Enough" toward the end of group and used to conduct an inspirational wrap-up to group of remembering that they are indeed enough in this life.  Therapeutic Goals: 1)  Highlight the differences between healthy and unhealthy supports 2)  Suggest the importance of being a part of one's own support system 2)  Discuss reasons people in one's life may eventually be unable to be continually supportive  3)  Identify the patient's current support system   4) Elicit commitments to add healthy supports and to become more conscious of being self-supportive   Summary of Patient Progress:  The patient listed as healthy supports the following:  family.  The patient expressed that unhealthy supports to be addressed include grandmother, sometimes.  Patient feels they are an unhealthy support for themselves.  Healthy supports which could be added  for increased stability and happiness include therapy.  Therapeutic Modalities:   Motivational Interviewing Activity  Lynnell Chad , MSW, LCSW

## 2021-12-06 NOTE — Progress Notes (Signed)
[facility] MD Progress Note  12/06/2021 3:45 PM Carol Schwartz  MRN:  [id number]  Subjective:  "I have more energy today because my sleep quality was better last night. I slept for more than 6 hours."   Brief History: Patient is a 26 year old female who denies having any previously diagnosed psychiatric history, who was admitted to the psychiatric unit for evaluation of extreme anxiety and paranoia.  Patient was medically cleared at [facility].   Psychiatric team made the following recommendations yesterday: -Discontinued Prazosin due to pt request. -Continue Prozac 30 mg once daily for MDD and GAD -Continue Abilify at 10mg  once daily -Continue trazodone 50 mg qhs -Continue on Inderal 20 mg tid - for anxiety and tachycardia   On assessment today, patient is examined in the assessment room. Chart reviewed and findings shared with the tx team and dicussed with Dr. . Alert and oriented x 4. Speech fluent and with full volume and pattern. Patient endorsed having good quality sleep last night.Reported sleeping greater than 6 hours. Denied experiencing lightheadedness. BP 117/77, P 104. Continues on propanolol due to continued anxiety and a history of tachycardia. She rates her depression at a 3/10  with 10 being the most depressed.   She reports less anxious, rated anxiety as 3/10 which is relieved by her medication. No trouble with the food intolerance that she endorsed on admission. Actively participating in group and interacting well on the unit. She denies all SI/HI/AVH. Patient signed a 72 hours form at 1448  yesterday for discharge.  Principal Problem: MDD (major depressive disorder), recurrent, severe, with psychosis (HCC) Diagnosis: Principal Problem:   MDD (major depressive disorder), recurrent, severe, with psychosis (HCC) Active Problems:   GAD (generalized anxiety disorder)  Past Psychiatric History:  Patient denies any previous psychiatric diagnosis or treatment.  Denies any history of  suicide attempt or other self-harm behavior.  Past Medical History:  Past Medical History:  Diagnosis Date   Anxiety    GERD (gastroesophageal reflux disease)     Past Surgical History:  Procedure Laterality Date   LAPAROSCOPY  02/24/2012   Procedure: LAPAROSCOPY OPERATIVE;  Surgeon: 02/26/2012, MD;  Location: [facility];  Service: Gynecology;  Laterality: N/A;  Drainage of Left Ovarian Cyst   OVARIAN CYST REMOVAL  02/24/2012   Procedure: OVARIAN CYSTECTOMY;  Surgeon: 02/26/2012, MD;  Location: [facility];  Service: Gynecology;  Laterality: Right;   Family History:  Family History  Problem Relation Age of Onset   Allergic rhinitis Mother    Diabetes Father    Heart attack Father 12   Allergic rhinitis Brother    Hypertension Other    Family Psychiatric  History: None known.  Social History:  Social History   Substance and Sexual Activity  Alcohol Use No     Social History   Substance and Sexual Activity  Drug Use No    Social History   Socioeconomic History   Marital status: Single    Spouse name: Not on file   Number of children: 0   Years of education: Not on file   Highest education level: Not on file  Occupational History   Not on file  Tobacco Use   Smoking status: Never    Passive exposure: Never   Smokeless tobacco: Never  Vaping Use   Vaping Use: Never used  Substance and Sexual Activity   Alcohol use: No   Drug use: No   Sexual activity: Not Currently  Other Topics Concern   Not on file  Social History Narrative   Not on file   Social Determinants of Health   Financial Resource Strain: Not on file  Food Insecurity: Not on file  Transportation Needs: Not on file  Physical Activity: Not on file  Stress: Not on file  Social Connections: Not on file   Additional Social History:       Sleep: Poor  Appetite:  Good  Current Medications: Current Facility-Administered Medications  Medication Dose Route Frequency Provider Last Rate Last  Admin   acetaminophen (TYLENOL) tablet 650 mg  650 mg Oral Q6H PRN Carlyn Reichert, MD       alum & mag hydroxide-simeth (MAALOX/MYLANTA) 200-200-20 MG/5ML suspension 30 mL  30 mL Oral Q4H PRN Carlyn Reichert, MD       ARIPiprazole (ABILIFY) tablet 10 mg  10 mg Oral Daily Massengill, Nathan, MD   10 mg at 12/06/21 0758   diphenhydrAMINE (BENADRYL) capsule 25 mg  25 mg Oral Q8H PRN Nira Conn A, NP   25 mg at 12/05/21 2100   feeding supplement (ENSURE ENLIVE / ENSURE PLUS) liquid 237 mL  237 mL Oral BID BM Hill, Shelbie Hutching, MD   237 mL at 12/02/21 1428   FLUoxetine (PROZAC) capsule 30 mg  30 mg Oral Daily Massengill, Harrold Donath, MD   30 mg at 12/06/21 1610   hydrocerin (EUCERIN) cream   Topical BID Phineas Inches, MD   Given at 12/06/21 0759   hydrOXYzine (ATARAX) tablet 25 mg  25 mg Oral Q8H PRN Carlyn Reichert, MD   25 mg at 12/02/21 1003   loratadine (CLARITIN) tablet 10 mg  10 mg Oral QPM Carlyn Reichert, MD   10 mg at 12/05/21 1814   magnesium hydroxide (MILK OF MAGNESIA) suspension 30 mL  30 mL Oral Daily PRN Carlyn Reichert, MD       norethindrone (AYGESTIN) tablet 5 mg  5 mg Oral Daily Massengill, Nathan, MD   5 mg at 12/06/21 0758   pantoprazole (PROTONIX) EC tablet 40 mg  40 mg Oral Daily Carlyn Reichert, MD   40 mg at 12/06/21 0758   propranolol (INDERAL) tablet 20 mg  20 mg Oral Q8H Massengill, Nathan, MD   20 mg at 12/06/21 1121   traZODone (DESYREL) tablet 50 mg  50 mg Oral QHS PRN Comer Locket, MD   50 mg at 12/05/21 2059    Lab Results:  No results found for this or any previous visit (from the past 48 hour(s)).   Blood Alcohol level:  Lab Results  Component Value Date   ETH <10 11/30/2021    Metabolic Disorder Labs: Lab Results  Component Value Date   HGBA1C 4.7 (L) 11/30/2021   MPG 88.19 11/30/2021   No results found for: PROLACTIN Lab Results  Component Value Date   CHOL 160 11/30/2021   TRIG 44 11/30/2021   HDL 26 (L) 11/30/2021   CHOLHDL 6.2  11/30/2021   VLDL 9 11/30/2021   LDLCALC 125 (H) 11/30/2021    Physical Findings: AIMS: Facial and Oral Movements Muscles of Facial Expression: None, normal Lips and Perioral Area: None, normal Jaw: None, normal Tongue: None, normal,Extremity Movements Upper (arms, wrists, hands, fingers): None, normal Lower (legs, knees, ankles, toes): None, normal, Trunk Movements Neck, shoulders, hips: None, normal, Overall Severity Severity of abnormal movements (highest score from questions above): None, normal Incapacitation due to abnormal movements: None, normal Patient's awareness of abnormal movements (rate only patient's report): No Awareness, Dental Status Current problems with teeth and/or dentures?: No  Does patient usually wear dentures?: No  CIWA:    COWS:      Musculoskeletal: Strength & Muscle Tone: within normal limits Gait & Station: normal Patient leans: N/A  Psychiatric Specialty Exam:  Presentation  General Appearance: Appropriate for Environment; Casual; Fairly Groomed  Eye Contact:Good  Speech:Clear and Coherent; Normal Rate  Speech Volume:Normal  Handedness:Right   Mood and Affect  Mood:Anxious; Depressed  Affect:Appropriate; Congruent   Thought Process  Thought Processes:Coherent; Linear  Descriptions of Associations:Intact  Orientation:Full (Time, Place and Person)  Thought Content:Logical; WDL  History of Schizophrenia/Schizoaffective disorder:No  Duration of Psychotic Symptoms:N/A  Hallucinations:Hallucinations: None  Ideas of Reference:None  Suicidal Thoughts:Suicidal Thoughts: No  Homicidal Thoughts:Homicidal Thoughts: No  Sensorium  Memory:Immediate Fair; Recent Fair; Remote Fair  Judgment:Fair  Insight:lacking  Executive Functions  Concentration:Fair  Attention Span:Good  Recall:Fair  Fund of Knowledge:Fair  Language:Good  Psychomotor Activity  Psychomotor Activity:Psychomotor Activity: Normal  Assets   Assets:Communication Skills; Physical Health; Social Support  Sleep  Sleep:Sleep: Good Number of Hours of Sleep: 6  Physical Exam: Physical Exam Vitals and nursing note reviewed.  Constitutional:      General: She is not in acute distress.    Appearance: Normal appearance. She is not ill-appearing, toxic-appearing or diaphoretic.  HENT:     Head: Normocephalic and atraumatic.     Right Ear: External ear normal.     Left Ear: External ear normal.     Mouth/Throat:     Mouth: Mucous membranes are moist.     Pharynx: Oropharynx is clear.  Eyes:     General: No scleral icterus.    Extraocular Movements: Extraocular movements intact.     Conjunctiva/sclera: Conjunctivae normal.     Pupils: Pupils are equal, round, and reactive to light.  Cardiovascular:     Rate and Rhythm: Tachycardia present.     Comments: P 104, Nursing staff to recheck Pulmonary:     Effort: Pulmonary effort is normal.  Abdominal:     Palpations: Abdomen is soft.  Genitourinary:    Comments: deferred Musculoskeletal:        General: Normal range of motion.     Cervical back: Normal range of motion and neck supple.  Skin:    General: Skin is warm.  Neurological:     General: No focal deficit present.     Mental Status: She is alert and oriented to person, place, and time.     Gait: Gait normal.  Psychiatric:        Behavior: Behavior normal.   Review of Systems  Constitutional: Negative.  Negative for chills, diaphoresis, fever, malaise/fatigue and weight loss.  HENT: Negative.  Negative for hearing loss.   Eyes: Negative.  Negative for blurred vision and double vision.  Respiratory:  Negative for cough, sputum production, shortness of breath and wheezing.   Cardiovascular: Negative.  Negative for chest pain and palpitations.       P 104, Nursing staff to recheck   Gastrointestinal: Negative.  Negative for abdominal pain, diarrhea and vomiting.  Genitourinary:  Negative for dysuria, flank pain,  frequency, hematuria and urgency.  Musculoskeletal:  Negative for joint pain and myalgias.  Skin: Negative.  Negative for itching and rash.  Neurological: Negative.  Negative for dizziness, tingling, tremors, sensory change, speech change, focal weakness, seizures, loss of consciousness, weakness and headaches.       Reports being "lightheaded"  Endo/Heme/Allergies: Negative.  Negative for environmental allergies and polydipsia. Does not bruise/bleed easily.  Shellfish Allergy Shellfish Allergy   Not Specified  12/01/2021    Psychiatric/Behavioral:  Positive for depression. Negative for hallucinations, memory loss, substance abuse and suicidal ideas. The patient is nervous/anxious and has insomnia.    Blood pressure 117/77, pulse (!) 104, temperature 98.9 F (37.2 C), temperature source Oral, resp. rate 20, height 5\' 8"  (1.727 m), weight 92.1 kg, SpO2 99 %. Body mass index is 30.87 kg/m.   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  (Per collateral in [facility] note, brother states that patient has been severely depressed, which has resulted in her medical concerns.  On my assessment, it is unclear if the paranoia surrounding her bodily symptoms are secondary to a mood episode, or are independent for mood episode.  Differential diagnosis list could also include delusional disorder versus premorbid psychotic disorder.) -Somatic symptom disorder -GAD     PLAN: Safety and Monitoring:             --  Voluntary admission to inpatient psychiatric unit for safety, stabilization and treatment             -- Daily contact with patient to assess and evaluate symptoms and progress in treatment             -- Patient's case to be discussed in multi-disciplinary team meeting             -- Observation Level : q15 minute  checks             -- Vital signs:  q12 hours             -- Precautions: suicide, elopement, and assault   2. Psychiatric Diagnoses and Treatment:  -Continue to hold Prazosin -Continue fluoxetine 30 mg once daily for MDD and GAD -Continue Abilify at 10mg  once daily -Change to PRN trazodone 50 mg qhs but if BP remain low will need to discontinue medication -Continue propranolol 20 mg tid - for anxiety and tachycardia but monitoring BP                        3. Medical Issues Being Addressed:              Tobacco Use Disorder             -- patient denied smoking at time of admission.   4. Discharge Planning:              -- Social work and case management to assist with discharge planning and identification of hospital follow-up needs prior to discharge             -- Estimated LOS: 5-7 days             -- Discharge Concerns: Need to establish a safety plan; Medication compliance and effectiveness             -- Discharge Goals: Return home with outpatient referrals for mental health follow-up including medication management/psychotherapy  Cecilie Lowersina C Percy Winterrowd, FNP 12/06/2021, 3:45 PM  Psychiatrist  Patient ID: Carol CortiKristen Schwartz, female   DOB: 07/31/1995, 26 y.o.   MRN: [id number] Patient ID: Carol CortiKristen Schwartz, female   DOB: 07/31/1995, 26 y.o.   MRN: [id number]

## 2021-12-06 NOTE — [facility] Group Notes (Signed)
.  Psychoeducational Group Note    Date:  5/28//23 Time: 1300-1400    Purpose of Group: . The group focus' on teaching patients on how to identify their needs and their Life Skills:  A group where two lists are made. What people need and what are things that we do that are unhealthy. The lists are developed by the patients and it is explained that we often do the actions that are not healthy to get our list of needs met.  Goal:: to develop the coping skills needed to get their needs met  Participation Level:  Active  Participation Quality:  Appropriate  Affect:  Appropriate  Cognitive:  Oriented  Insight:  Improving  Engagement in Group:  Engaged  Additional Comments: Rates her energy at a 5/10. Participated in the group.  Paulino Rily

## 2021-12-07 ENCOUNTER — Encounter (HOSPITAL_COMMUNITY): Payer: Self-pay

## 2021-12-07 MED ORDER — PROPRANOLOL HCL 20 MG PO TABS
20.0000 mg | ORAL_TABLET | Freq: Three times a day (TID) | ORAL | Status: AC
  Administered 2021-12-07 (×2): 20 mg via ORAL
  Filled 2021-12-07 (×2): qty 1

## 2021-12-07 MED ORDER — FLUOXETINE HCL 20 MG PO CAPS
40.0000 mg | ORAL_CAPSULE | Freq: Every day | ORAL | Status: DC
  Administered 2021-12-08: 40 mg via ORAL
  Filled 2021-12-07 (×2): qty 2

## 2021-12-07 MED ORDER — PROPRANOLOL HCL ER 60 MG PO CP24
60.0000 mg | ORAL_CAPSULE | Freq: Every day | ORAL | Status: DC
  Administered 2021-12-08: 60 mg via ORAL
  Filled 2021-12-07 (×2): qty 1

## 2021-12-07 NOTE — Progress Notes (Signed)
The patient attended a portion of the group and then walked out.

## 2021-12-07 NOTE — Progress Notes (Signed)
D:  Patient's self inventory sheet, patient has fair sleep, no sleep medication.  Fair appetite, normal energy level, good concentration.  Rated appetite, energy, and concentration #1.  Denied withdrawals.  Denied SI.  Denied physical pain.  Plans to find resource articles of the hospital after discharge.  Plans to talk to staff.  No discharge planning yet.  A:  Medications administered per MD orders.  Emotional support and  encouragement given to patient. R:  Denied SI and HI, contracts for safety.  Denied A/V hallucinations.  Safety maintained with 15 minute checks.  Patient stated she slept 6 hours last night.

## 2021-12-07 NOTE — [facility] Group Notes (Signed)
  Spiritual care group on grief and loss facilitated by chaplain Carol Schwartz, [facility]   Group Goal:   Support / Education around grief and loss   Members engage in facilitated group support and psycho-social education.   Group Description:   Following introductions and group rules, group members engaged in facilitated group dialog and support around topic of loss, with particular support around experiences of loss in their lives. Group Identified types of loss (relationships / self / things) and identified patterns, circumstances, and changes that precipitate losses. Reflected on thoughts / feelings around loss, normalized grief responses, and recognized variety in grief experience. Group noted Worden's four tasks of grief in discussion.   Group drew on Adlerian / Rogerian, narrative, MI,   Patient Progress: Carol Schwartz attended group and participated and engaged in group conversation.  She shared about some of her own grief responses and her comments showed insight. She was supportive of peers.  [address] [place] Pager, [phone]

## 2021-12-07 NOTE — [facility] Group Notes (Signed)
[facility] Group Notes- Patients were educated on the difference between positive and negative thinking and how positive reframing can impact mood. The patients were then given a poem to read by the Dalai Lama on the power of thoughts. The patients were then asked to share one negative thought or belief they would like to let go of. Patient shared and participated.  

## 2021-12-07 NOTE — Plan of Care (Signed)
Nurse discussed anxiety, depression and coping skills with patient.  

## 2021-12-07 NOTE — Progress Notes (Signed)
[facility] MD Progress Note  12/07/2021 1:56 PM Carol Schwartz  MRN:  [id number]  Subjective:  "Tired and was not able to sleep very well. My prazosin was discontinued and I do not have any night mares."   Brief History: Patient is a 26 year old female who denies having any previously diagnosed psychiatric history, who was admitted to the psychiatric unit for evaluation of extreme anxiety and paranoia.  Patient was medically cleared at [facility].   Psychiatric team made the following recommendations yesterday: -Continue Prozac 30 mg once daily for MDD and GAD -Continue Abilify 10mg  once daily -Continue trazodone 50 mg qhs -Continue on Inderal 20 mg tid - for anxiety and tachycardia   On exam today patient reports that her mood is improved and feels less depressed.  Reports anxiety is less.  Reports his sleep is okay.  Reports less anxiety surrounding itches, rashes, or other somatic complaints.  She reports being less bothered by worry about somatic symptoms.  Reports that sleep is improved.  Appetite is improved.  Reports concentration is better and this is demonstrated during the interview. Denies suicidal thoughts.  Denies homicidal thoughts.  Patient denies feeling paranoid.  Denies AH and VH. Denies side effects to current medications. We discussed we will switch propranolol from 20 mg IR 3 times daily to the extended release version 60 mg once daily, starting tomorrow. Patient is also agreeable with increasing fluoxetine, for treatment of mood, anxiety, and decrease pill burden.  Discharge planning for tomorrow.  Principal Problem: MDD (major depressive disorder), recurrent, severe, with psychosis (HCC) Diagnosis: Principal Problem:   MDD (major depressive disorder), recurrent, severe, with psychosis (HCC) Active Problems:   GAD (generalized anxiety disorder)  Past Psychiatric History:  Patient denies any previous psychiatric diagnosis or treatment.  Denies any history of suicide attempt or  other self-harm behavior.  Past Medical History:  Past Medical History:  Diagnosis Date   Anxiety    GERD (gastroesophageal reflux disease)     Past Surgical History:  Procedure Laterality Date   LAPAROSCOPY  02/24/2012   Procedure: LAPAROSCOPY OPERATIVE;  Surgeon: 02/26/2012, MD;  Location: [facility];  Service: Gynecology;  Laterality: N/A;  Drainage of Left Ovarian Cyst   OVARIAN CYST REMOVAL  02/24/2012   Procedure: OVARIAN CYSTECTOMY;  Surgeon: 02/26/2012, MD;  Location: [facility];  Service: Gynecology;  Laterality: Right;   Family History:  Family History  Problem Relation Age of Onset   Allergic rhinitis Mother    Diabetes Father    Heart attack Father 12   Allergic rhinitis Brother    Hypertension Other    Family Psychiatric  History: None known.  Social History:  Social History   Substance and Sexual Activity  Alcohol Use No     Social History   Substance and Sexual Activity  Drug Use No    Social History   Socioeconomic History   Marital status: Single    Spouse name: Not on file   Number of children: 0   Years of education: Not on file   Highest education level: Not on file  Occupational History   Not on file  Tobacco Use   Smoking status: Never    Passive exposure: Never   Smokeless tobacco: Never  Vaping Use   Vaping Use: Never used  Substance and Sexual Activity   Alcohol use: No   Drug use: No   Sexual activity: Not Currently  Other Topics Concern   Not on file  Social History Narrative  Not on file   Social Determinants of Health   Financial Resource Strain: Not on file  Food Insecurity: Not on file  Transportation Needs: Not on file  Physical Activity: Not on file  Stress: Not on file  Social Connections: Not on file   Additional Social History:       Sleep: Improving  Appetite:  Good  Current Medications: Current Facility-Administered Medications  Medication Dose Route Frequency Provider Last Rate Last Admin    acetaminophen (TYLENOL) tablet 650 mg  650 mg Oral Q6H PRN Carlyn Reichert, MD       alum & mag hydroxide-simeth (MAALOX/MYLANTA) 200-200-20 MG/5ML suspension 30 mL  30 mL Oral Q4H PRN Carlyn Reichert, MD       ARIPiprazole (ABILIFY) tablet 10 mg  10 mg Oral Daily Takira Sherrin, Harrold Donath, MD   10 mg at 12/07/21 0751   diphenhydrAMINE (BENADRYL) capsule 25 mg  25 mg Oral Q8H PRN Nira Conn A, NP   25 mg at 12/05/21 2100   feeding supplement (ENSURE ENLIVE / ENSURE PLUS) liquid 237 mL  237 mL Oral BID BM Hill, Shelbie Hutching, MD   237 mL at 12/02/21 1428   [START ON 12/08/2021] FLUoxetine (PROZAC) capsule 40 mg  40 mg Oral Daily Rachel Samples, Harrold Donath, MD       hydrocerin (EUCERIN) cream   Topical BID Phineas Inches, MD   Given at 12/06/21 1641   hydrOXYzine (ATARAX) tablet 25 mg  25 mg Oral Q8H PRN Carlyn Reichert, MD   25 mg at 12/02/21 1003   loratadine (CLARITIN) tablet 10 mg  10 mg Oral QPM Carlyn Reichert, MD   10 mg at 12/06/21 1822   magnesium hydroxide (MILK OF MAGNESIA) suspension 30 mL  30 mL Oral Daily PRN Carlyn Reichert, MD       norethindrone (AYGESTIN) tablet 5 mg  5 mg Oral Daily Evelin Cake, MD   5 mg at 12/07/21 0752   pantoprazole (PROTONIX) EC tablet 40 mg  40 mg Oral Daily Carlyn Reichert, MD   40 mg at 12/07/21 0752   propranolol (INDERAL) tablet 20 mg  20 mg Oral Q8H Oakleigh Hesketh, Harrold Donath, MD   20 mg at 12/07/21 4098   traZODone (DESYREL) tablet 50 mg  50 mg Oral QHS PRN Comer Locket, MD   50 mg at 12/05/21 2059    Lab Results:  No results found for this or any previous visit (from the past 48 hour(s)).   Blood Alcohol level:  Lab Results  Component Value Date   ETH <10 11/30/2021    Metabolic Disorder Labs: Lab Results  Component Value Date   HGBA1C 4.7 (L) 11/30/2021   MPG 88.19 11/30/2021   No results found for: PROLACTIN Lab Results  Component Value Date   CHOL 160 11/30/2021   TRIG 44 11/30/2021   HDL 26 (L) 11/30/2021   CHOLHDL 6.2 11/30/2021    VLDL 9 11/30/2021   LDLCALC 125 (H) 11/30/2021    Physical Findings: AIMS: Facial and Oral Movements Muscles of Facial Expression: None, normal Lips and Perioral Area: None, normal Jaw: None, normal Tongue: None, normal,Extremity Movements Upper (arms, wrists, hands, fingers): None, normal Lower (legs, knees, ankles, toes): None, normal, Trunk Movements Neck, shoulders, hips: None, normal, Overall Severity Severity of abnormal movements (highest score from questions above): None, normal Incapacitation due to abnormal movements: None, normal Patient's awareness of abnormal movements (rate only patient's report): No Awareness, Dental Status Current problems with teeth and/or dentures?: No Does patient usually wear dentures?:  No  CIWA:    COWS:      Musculoskeletal: Strength & Muscle Tone: within normal limits Gait & Station: normal Patient leans: N/A  Psychiatric Specialty Exam:  Presentation  General Appearance: Appropriate for Environment; Casual; Fairly Groomed  Eye Contact:Good  Speech:Clear and Coherent; Normal Rate  Speech Volume:Normal  Handedness:Right   Mood and Affect  Mood:Less Anxious; Less Depressed  Affect:Appropriate; Congruent   Thought Process  Thought Processes:Coherent; Linear  Descriptions of Associations:Intact  Orientation:Full (Time, Place and Person)  Thought Content:Logical; WDL  History of Schizophrenia/Schizoaffective disorder:No  Duration of Psychotic Symptoms:N/A  Hallucinations:Hallucinations: None  Ideas of Reference:None  Suicidal Thoughts:Suicidal Thoughts: No  Homicidal Thoughts:Homicidal Thoughts: No  Sensorium  Memory:Immediate Fair; Recent Fair; Remote Fair  Judgment:Fair  Insight: improving   Executive Functions  Concentration:Fair  Attention Span:Good  Recall:Fair  Fund of Knowledge:Fair  Language:Good  Psychomotor Activity  Psychomotor Activity:Psychomotor Activity: Normal  Assets   Assets:Communication Skills; Physical Health; Social Support  Sleep  Sleep:Sleep: Good Number of Hours of Sleep: 6  Physical Exam: Physical Exam Vitals and nursing note reviewed.  Constitutional:      General: She is not in acute distress.    Appearance: Normal appearance. She is not ill-appearing, toxic-appearing or diaphoretic.  HENT:     Head: Normocephalic and atraumatic.     Right Ear: External ear normal.     Left Ear: External ear normal.     Mouth/Throat:     Mouth: Mucous membranes are moist.     Pharynx: Oropharynx is clear.  Eyes:     General: No scleral icterus.    Extraocular Movements: Extraocular movements intact.     Conjunctiva/sclera: Conjunctivae normal.     Pupils: Pupils are equal, round, and reactive to light.  Cardiovascular:     Rate and Rhythm: Tachycardia present.     Comments: P 109, Nursing staff to recheck Pulmonary:     Effort: Pulmonary effort is normal.  Abdominal:     Palpations: Abdomen is soft.  Genitourinary:    Comments: deferred Musculoskeletal:        General: Normal range of motion.     Cervical back: Normal range of motion and neck supple.  Skin:    General: Skin is warm.  Neurological:     General: No focal deficit present.     Mental Status: She is alert and oriented to person, place, and time.     Motor: No weakness.     Gait: Gait normal.  Psychiatric:        Mood and Affect: Mood normal.        Behavior: Behavior normal.        Thought Content: Thought content normal.        Judgment: Judgment normal.   Review of Systems  Constitutional: Negative.  Negative for chills, diaphoresis, fever, malaise/fatigue and weight loss.  HENT: Negative.  Negative for hearing loss.   Eyes: Negative.  Negative for blurred vision and double vision.  Respiratory:  Negative for cough, sputum production, shortness of breath and wheezing.   Cardiovascular: Negative.  Negative for chest pain and palpitations.       P 109, Nursing staff to  recheck   Gastrointestinal: Negative.  Negative for abdominal pain, diarrhea and vomiting.  Genitourinary:  Negative for dysuria, flank pain, frequency, hematuria and urgency.  Musculoskeletal:  Negative for joint pain and myalgias.  Skin: Negative.  Negative for itching and rash.  Neurological: Negative.  Negative for dizziness, tingling, tremors,  sensory change, speech change, focal weakness, seizures, loss of consciousness, weakness and headaches.       Reports being "lightheaded"  Endo/Heme/Allergies: Negative.  Negative for environmental allergies and polydipsia. Does not bruise/bleed easily.         Shellfish Allergy Shellfish Allergy   Not Specified  12/01/2021    Psychiatric/Behavioral:  Negative for depression, hallucinations, memory loss, substance abuse and suicidal ideas. The patient is nervous/anxious. The patient does not have insomnia.    Blood pressure 94/80, pulse 86, temperature 99 F (37.2 C), temperature source Oral, resp. rate 18, height  (1.727 m), weight 92.1 kg, SpO2 99 %. Body mass index is 30.87 kg/m.   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  (Per collateral in [facility] note, brother states that patient has been severely depressed, which has resulted in her medical concerns.  On my assessment, it is unclear if the paranoia surrounding her bodily symptoms are secondary to a mood episode, or are independent for mood episode.  Differential diagnosis list could also include delusional disorder versus premorbid psychotic disorder.) -Somatic symptom disorder -GAD     PLAN: Safety and Monitoring:             --  Voluntary admission to inpatient psychiatric unit for safety, stabilization and treatment             -- Daily contact with patient to assess and evaluate symptoms and  progress in treatment             -- Patient's case to be discussed in multi-disciplinary team meeting             -- Observation Level : q15 minute checks             -- Vital signs:  q12 hours             -- Precautions: suicide, elopement, and assault   2. Psychiatric Diagnoses and Treatment:  -Continue to hold Prazosin -Increase fluoxetine from 30 mg to 40 mg once daily for MDD and GAD -Continue Abilify  once daily -Change to PRN trazodone 50 mg qhs ( if BP remain low will need to discontinue medication)  -Change propranolol 20 mg tid -> to LA 60 mg once daily (first dose tomorrow 5-30) for anxiety and tachycardia - monitor BP                        3. Medical Issues Being Addressed:              Tobacco Use Disorder             -- patient denied smoking at time of admission.   4. Discharge Planning:              -- Social work and case management to assist with discharge planning and identification of hospital follow-up needs prior to discharge             -- Estimated LOS: 5-7 days             -- Discharge Concerns: Need to establish a safety plan; Medication compliance and effectiveness             -- Discharge Goals: Return home with outpatient referrals for mental health follow-up including medication management/psychotherapy  Cristy Hilts, MD 12/07/2021,  1:56 PM  Psychiatrist  Patient ID: Carol Schwartz, female   DOB: [birth year], 26 y.o.   MRN: [id number]  Total Time Spent in Direct Patient Care:  I personally spent 35 minutes on the unit in direct patient care. The direct patient care time included face-to-face time with the patient, reviewing the patient's chart, communicating with other professionals, and coordinating care. Greater than 50% of this time was spent in counseling or coordinating care with the patient regarding goals of hospitalization, psycho-education, and discharge planning needs.   Phineas Inches, MD Psychiatrist

## 2021-12-07 NOTE — Group Note (Signed)
[facility] LCSW Group Therapy Note   Group Date: 12/07/2021 Start Time: 1300 End Time: 1330   Type of Therapy/Topic:  Group Therapy:  Emotion Regulation  Participation Level:  Active   Mood: Euthymic   Description of Group:    The purpose of this group is to assist patients in learning to regulate negative emotions and experience positive emotions. Patients will be guided to discuss ways in which they have been vulnerable to their negative emotions. These vulnerabilities will be juxtaposed with experiences of positive emotions or situations, and patients challenged to use positive emotions to combat negative ones. Special emphasis will be placed on coping with negative emotions in conflict situations, and patients will process healthy conflict resolution skills.  Therapeutic Goals: Patient will identify two positive emotions or experiences to reflect on in order to balance out negative emotions:  Patient will label two or more emotions that they find the most difficult to experience:  Patient will be able to demonstrate positive conflict resolution skills through discussion or role plays:   Summary of Patient Progress:   Patient was present for the entirety of the group session. Patient was an active listener and participated in the topic of discussion, provided helpful advice to others, and added nuance to topic of conversation. Patient participated in example exercises in identifying potential emotions.      Therapeutic Modalities:   Cognitive Behavioral Therapy Feelings Identification Dialectical Behavioral Therapy   Elgene Coral W Blaire Palomino, LCSWA 

## 2021-12-07 NOTE — BH IP Treatment Plan (Signed)
Interdisciplinary Treatment and Diagnostic Plan Update  12/07/2021 Time of Session: 0830 Carol CortiKristen Schwartz MRN: [id number]  Principal Diagnosis: MDD (major depressive disorder), recurrent, severe, with psychosis (HCC)  Secondary Diagnoses: Principal Problem:   MDD (major depressive disorder), recurrent, severe, with psychosis (HCC) Active Problems:   GAD (generalized anxiety disorder)   Current Medications:  Current Facility-Administered Medications  Medication Dose Route Frequency Provider Last Rate Last Admin   acetaminophen (TYLENOL) tablet 650 mg  650 mg Oral Q6H PRN Carlyn ReichertGabrielle, Nick, MD       alum & mag hydroxide-simeth (MAALOX/MYLANTA) 200-200-20 MG/5ML suspension 30 mL  30 mL Oral Q4H PRN Carlyn ReichertGabrielle, Nick, MD       ARIPiprazole (ABILIFY) tablet 10 mg  10 mg Oral Daily Massengill, Harrold DonathNathan, MD   10 mg at 12/07/21 0751   diphenhydrAMINE (BENADRYL) capsule 25 mg  25 mg Oral Q8H PRN Nira ConnBerry, Jason A, NP   25 mg at 12/05/21 2100   feeding supplement (ENSURE ENLIVE / ENSURE PLUS) liquid 237 mL  237 mL Oral BID BM Hill, Shelbie HutchingStephanie Leigh, MD   237 mL at 12/02/21 1428   [START ON 12/08/2021] FLUoxetine (PROZAC) capsule 40 mg  40 mg Oral Daily Massengill, Harrold DonathNathan, MD       hydrocerin (EUCERIN) cream   Topical BID Phineas InchesMassengill, Nathan, MD   Given at 12/06/21 1641   hydrOXYzine (ATARAX) tablet 25 mg  25 mg Oral Q8H PRN Carlyn ReichertGabrielle, Nick, MD   25 mg at 12/02/21 1003   loratadine (CLARITIN) tablet 10 mg  10 mg Oral QPM Carlyn ReichertGabrielle, Nick, MD   10 mg at 12/06/21 1822   magnesium hydroxide (MILK OF MAGNESIA) suspension 30 mL  30 mL Oral Daily PRN Carlyn ReichertGabrielle, Nick, MD       norethindrone (AYGESTIN) tablet 5 mg  5 mg Oral Daily Massengill, Nathan, MD   5 mg at 12/07/21 0752   pantoprazole (PROTONIX) EC tablet 40 mg  40 mg Oral Daily Carlyn ReichertGabrielle, Nick, MD   40 mg at 12/07/21 [id number]   propranolol (INDERAL) tablet 20 mg  20 mg Oral Q8H Massengill, Harrold DonathNathan, MD   20 mg at 12/07/21 1417   [START ON 12/08/2021] propranolol ER  (INDERAL LA) 24 hr capsule 60 mg  60 mg Oral Daily Massengill, Harrold DonathNathan, MD       traZODone (DESYREL) tablet 50 mg  50 mg Oral QHS PRN Comer LocketSingleton, [name] E, MD   50 mg at 12/05/21 2059   PTA Medications: Medications Prior to Admission  Medication Sig Dispense Refill Last Dose   diphenhydrAMINE (BENADRYL) 25 mg capsule Take 25 mg by mouth every 6 (six) hours as needed for itching or allergies.      hydrOXYzine (ATARAX) 25 MG tablet Take 1 tablet (25 mg total) by mouth every 8 (eight) hours as needed for up to 30 doses for anxiety. 30 tablet 0    levocetirizine (XYZAL) 5 MG tablet Take 1 tablet (5 mg total) by mouth every evening. 30 tablet 5    norethindrone (AYGESTIN) 5 MG tablet Take 5 mg by mouth daily.      omeprazole (PRILOSEC) 20 MG capsule Take 1 capsule (20 mg total) by mouth daily. 30 capsule 5    VENTOLIN HFA 108 (90 Base) MCG/ACT inhaler Inhale 2 puffs into the lungs every 4 (four) hours as needed.       Patient Stressors: Financial difficulties   Health problems   Medication change or noncompliance    Patient Strengths: Capable of independent living  Communication skills  Supportive  family/friends   Treatment Modalities: Medication Management, Group therapy, Case management,  1 to 1 session with clinician, Psychoeducation, Recreational therapy.   Physician Treatment Plan for Primary Diagnosis: MDD (major depressive disorder), recurrent, severe, with psychosis (HCC) Long Term Goal(s): Improvement in symptoms so as ready for discharge   Short Term Goals: Ability to identify changes in lifestyle to reduce recurrence of condition will improve Ability to verbalize feelings will improve Ability to disclose and discuss suicidal ideas Ability to demonstrate self-control will improve Ability to identify and develop effective coping behaviors will improve Ability to maintain clinical measurements within normal limits will improve Compliance with prescribed medications will  improve  Medication Management: Evaluate patient's response, side effects, and tolerance of medication regimen.  Therapeutic Interventions: 1 to 1 sessions, Unit Group sessions and Medication administration.  Evaluation of Outcomes: Progressing  Physician Treatment Plan for Secondary Diagnosis: Principal Problem:   MDD (major depressive disorder), recurrent, severe, with psychosis (HCC) Active Problems:   GAD (generalized anxiety disorder)  Long Term Goal(s): Improvement in symptoms so as ready for discharge   Short Term Goals: Ability to identify changes in lifestyle to reduce recurrence of condition will improve Ability to verbalize feelings will improve Ability to disclose and discuss suicidal ideas Ability to demonstrate self-control will improve Ability to identify and develop effective coping behaviors will improve Ability to maintain clinical measurements within normal limits will improve Compliance with prescribed medications will improve     Medication Management: Evaluate patient's response, side effects, and tolerance of medication regimen.  Therapeutic Interventions: 1 to 1 sessions, Unit Group sessions and Medication administration.  Evaluation of Outcomes: Progressing   RN Treatment Plan for Primary Diagnosis: MDD (major depressive disorder), recurrent, severe, with psychosis (HCC) Long Term Goal(s): Knowledge of disease and therapeutic regimen to maintain health will improve  Short Term Goals: Ability to remain free from injury will improve, Ability to verbalize frustration and anger appropriately will improve, Ability to demonstrate self-control, Ability to participate in decision making will improve, Ability to verbalize feelings will improve, Ability to disclose and discuss suicidal ideas, Ability to identify and develop effective coping behaviors will improve, and Compliance with prescribed medications will improve  Medication Management: RN will administer  medications as ordered by provider, will assess and evaluate patient's response and provide education to patient for prescribed medication. RN will report any adverse and/or side effects to prescribing provider.  Therapeutic Interventions: 1 on 1 counseling sessions, Psychoeducation, Medication administration, Evaluate responses to treatment, Monitor vital signs and CBGs as ordered, Perform/monitor CIWA, COWS, AIMS and Fall Risk screenings as ordered, Perform wound care treatments as ordered.  Evaluation of Outcomes: Progressing   LCSW Treatment Plan for Primary Diagnosis: MDD (major depressive disorder), recurrent, severe, with psychosis (HCC) Long Term Goal(s): Safe transition to appropriate next level of care at discharge, Engage patient in therapeutic group addressing interpersonal concerns.  Short Term Goals: Engage patient in aftercare planning with referrals and resources, Increase social support, Increase ability to appropriately verbalize feelings, Increase emotional regulation, Facilitate acceptance of mental health diagnosis and concerns, Facilitate patient progression through stages of change regarding substance use diagnoses and concerns, Identify triggers associated with mental health/substance abuse issues, and Increase skills for wellness and recovery  Therapeutic Interventions: Assess for all discharge needs, 1 to 1 time with Social worker, Explore available resources and support systems, Assess for adequacy in community support network, Educate family and significant other(s) on suicide prevention, Complete Psychosocial Assessment, Interpersonal group therapy.  Evaluation of Outcomes:  Progressing   Progress in Treatment: Attending groups: Yes. Participating in groups: Yes. Taking medication as prescribed: Yes. Toleration medication: Yes. Family/Significant other contact made: Yes, individual(s) contacted:  SPE completed with Fredirick Maudlin, brother.  Patient understands  diagnosis: Yes. Discussing patient identified problems/goals with staff: Yes. Medical problems stabilized or resolved: Yes. Denies suicidal/homicidal ideation: No. Issues/concerns per patient self-inventory: Yes. Other: none  New problem(s) identified: No, Describe:  none  New Short Term/Long Term Goal(s):  Patient Goals:    Discharge Plan or Barriers:   Reason for Continuation of Hospitalization: Anxiety Other; describe paranoia   Estimated Length of Stay: 1-7 days   Last 3 [place] Suicide Severity Risk Score: Flowsheet Row [facility] Most recent reading at 11/30/2021  8:00 PM ED from 11/30/2021 in [facility] Most recent reading at 11/30/2021 12:47 PM ED from 11/28/2021 in [facility] Most recent reading at 11/28/2021  2:19 PM  C-SSRS RISK CATEGORY No Risk No Risk No Risk       Last PHQ 2/9 Scores:    11/30/2021    9:51 AM  Depression screen PHQ 2/9  Decreased Interest 1  Down, Depressed, Hopeless 2  PHQ - 2 Score 3  Altered sleeping 1  Tired, decreased energy 1  Change in appetite 0  Feeling bad or failure about yourself  1  Trouble concentrating 2  Moving slowly or fidgety/restless 0  Suicidal thoughts 0  PHQ-9 Score 8  Difficult doing work/chores Somewhat difficult    Scribe for Treatment Team: Almedia Balls 12/07/2021 5:21 PM

## 2021-12-08 MED ORDER — HYDROCERIN EX CREA: 1.0000 "application " | TOPICAL_CREAM | Freq: Two times a day (BID) | CUTANEOUS | 0 refills | Status: AC

## 2021-12-08 MED ORDER — PROPRANOLOL HCL ER 60 MG PO CP24: 60.0000 mg | ORAL_CAPSULE | Freq: Every day | ORAL | 0 refills | Status: AC

## 2021-12-08 MED ORDER — TRAZODONE HCL 50 MG PO TABS: 50.0000 mg | ORAL_TABLET | Freq: Every evening | ORAL | 0 refills | Status: DC | PRN

## 2021-12-08 MED ORDER — FLUOXETINE HCL 40 MG PO CAPS: 40.0000 mg | ORAL_CAPSULE | Freq: Every day | ORAL | 0 refills | Status: AC

## 2021-12-08 MED ORDER — HYDROXYZINE HCL 25 MG PO TABS: 25.0000 mg | ORAL_TABLET | Freq: Three times a day (TID) | ORAL | 0 refills | Status: DC | PRN

## 2021-12-08 MED ORDER — ARIPIPRAZOLE 10 MG PO TABS: 10.0000 mg | ORAL_TABLET | Freq: Every day | ORAL | 0 refills | Status: AC

## 2021-12-08 NOTE — Progress Notes (Signed)
  [facility] Adult Case Management Discharge Plan :  Will you be returning to the same living situation after discharge:  Yes,  Home At discharge, do you have transportation home?: Yes,  Taxi  Do you have the ability to pay for your medications: Yes,  Medicaid   Release of information consent forms completed and in the chart;  Patient's signature needed at discharge.  Patient to Follow up at:  [facility] Follow up on 12/09/2021.   Specialty: Behavioral Health Why: You have a walk in appointment for therapy and medication management on 12/09/21 at 7:30 am.  Services are provided on a first come, first served basis. Contact information: [place].   Specialty: Professional Counselor Why: You may also go to this provider for therapy and medication management services, on walk in days:  Monday and Wednesday, arrive at 7:30 am. Contact information: [place] [address] [phone]         Garnet Sierras, NP. Go on 12/10/2021.   Specialty: Adult Health Nurse Practitioner Why: You have a hospital follow up appointment with your primary care provider on 12/10/21 at 9:00 am with  Garnet Sierras, NP.  This appointment will be held in person Contact information: [place] [address] [phone]                 Next level of care provider has access to [place] and Suicide Prevention discussed: Yes,  with patient and Brother      Has patient been referred to the Quitline?: N/A patient is not a smoker  Patient has been referred for addiction treatment: [place], [place] 12/08/2021, 9:13 AM

## 2021-12-08 NOTE — [facility]HH Suicide Risk Assessment (Signed)
[facility]   Principal Problem: MDD (major depressive disorder), recurrent, severe, with psychosis (HCC) Discharge Diagnoses: Principal Problem:   MDD (major depressive disorder), recurrent, severe, with psychosis (HCC) Active Problems:   GAD (generalized anxiety disorder)   Total Time spent with patient: 20 minutes  Patient is a 26 year old female who denies having any previously diagnosed psychiatric history, who was admitted to the psychiatric unit for evaluation of extreme anxiety and paranoia.  [facility]   During the patient's hospitalization, patient had extensive initial psychiatric evaluation, and follow-up psychiatric evaluations every day.  Psychiatric diagnoses provided upon initial assessment: -Major depressive disorder severe recurrent with psychotic features.  Per collateral in [facility] note, brother states that patient has been severely depressed, which has resulted in her medical concerns.  On my assessment, it is unclear if the paranoia surrounding her bodily symptoms are secondary to a mood episode, or are independent for mood episode.  Differential diagnosis list could also include delusional disorder versus premorbid psychotic disorder. -Somatic symptom disorder -GAD  Patient's psychiatric medications were adjusted on admission:  -Increase fluoxetine from 10 mg once daily to 20 mg once daily for MDD and GAD -Start Abilify 5 mg once daily for MDD and paranoid symptoms.  We also consider starting Risperdal for the symptoms, but ultimately decided to start Abilify due to significant comorbid mood symptoms.  During the hospitalization, other adjustments were made to the patient's psychiatric medication regimen:  -Prozac increased to 40 mg once daily -Abilify increased to 10 mg once daily -Propranolol started and titrated to LA 60 mg once daily   Gradually, patient started adjusting to milieu.   Patient's care was  discussed during the interdisciplinary team meeting every day during the hospitalization.  The patient denies having side effects to prescribed psychiatric medication.  The patient reports their target psychiatric symptoms of anxiety, paranoia, and excessive anxiety surrounding somatic symptoms, all responded well to the psychiatric medications, and the patient reports overall benefit other psychiatric hospitalization. Supportive psychotherapy was provided to the patient. The patient also participated in regular group therapy while admitted.   Labs were reviewed with the patient, and abnormal results were discussed with the patient.  The patient denied having suicidal thoughts more than 48 hours prior to discharge.  Patient denies having homicidal thoughts.  Patient denies having auditory hallucinations.  Patient denies any visual hallucinations.  Patient denies having paranoid thoughts.  The patient is able to verbalize their individual safety plan to this provider.  It is recommended to the patient to continue psychiatric medications as prescribed, after discharge from the hospital.    It is recommended to the patient to follow up with your outpatient psychiatric provider and PCP.  Discussed with the patient, the impact of alcohol, drugs, tobacco have been there overall psychiatric and medical wellbeing, and total abstinence from substance use was recommended the patient.    Musculoskeletal: Strength & Muscle Tone: within normal limits Gait & Station: normal Patient leans: N/A  AIMS score zero on day of discharge.   Psychiatric Specialty Exam  Presentation  General Appearance: Casual  Eye Contact:Good  Speech:Normal Rate  Speech Volume:Normal  Handedness:Right   Mood and Affect  Mood:Euthymic  Duration of Depression Symptoms: Greater than two weeks  Affect:Appropriate; Congruent; Full Range   Thought Process  Thought Processes:Linear  Descriptions of  Associations:Intact  Orientation:Full (Time, Place and Person)  Thought Content:Logical  History of Schizophrenia/Schizoaffective disorder:No  Duration of Psychotic Symptoms:Less than six  months  Hallucinations:Hallucinations: None  Ideas of Reference:None  Suicidal Thoughts:Suicidal Thoughts: No  Homicidal Thoughts:Homicidal Thoughts: No   Sensorium  Memory:Immediate Good; Recent Good; Remote Good  Judgment:Good  Insight:Good   Executive Functions  Concentration:Good  Attention Span:Good  Recall:Good  Fund of Knowledge:Good  Language:Good   Psychomotor Activity  Psychomotor Activity:Psychomotor Activity: Normal   Assets  Assets:Communication Skills; Physical Health; Social Support   Sleep  Sleep:Sleep: Good   Physical Exam: Physical Exam See discharge summary  ROS See discharge summary  Blood pressure 103/77, pulse (!) 101, temperature 98.5 F (36.9 C), temperature source Oral, resp. rate 18, height 5\' 8"  (1.727 m), weight 92.1 kg, SpO2 100 %. Body mass index is 30.87 kg/m.  Mental Status Per Nursing Assessment::   On Admission:  NA (Simultaneous filing. User may not have seen previous data.)  Demographic factors:  Adolescent or young adult, Low socioeconomic status Loss Factors:  Financial problems / change in socioeconomic status  Historical Factors:  NA  Risk Reduction Factors:  Living with another person, especially a relative   Continued Clinical Symptoms:  Anxiety - less Depression - mood stable. Denies SI.   Cognitive Features That Contribute To Risk:  None    Suicide Risk:  Mild: There are no identifiable suicide plans, no associated intent, mild dysphoria and related symptoms, good self-control (both objective and subjective assessment), few other risk factors, and identifiable protective factors, including available and accessible social support.   Follow-up Information     [facility] Follow up on  12/09/2021.   Specialty: Behavioral Health Why: You have a walk in appointment for therapy and medication management on 12/09/21 at 7:30 am.  Services are provided on a first come, first served basis. Contact information: [address] [place] [phone]        [place] Follow [facility].   Specialty: Professional Counselor Why: You may also go to this provider for therapy and medication management services, on walk in days:  Monday and Wednesday, arrive at 7:30 am. Contact information: [address] [place] [phone]         [phone], NP. Go on 12/10/2021.   Specialty: Adult Health Nurse Practitioner Why: You have a hospital follow up appointment with your primary care provider on 12/10/21 at 9:00 am with  02/09/22, NP.  This appointment will be held in person Contact information: [address] [place] [phone]                 Plan Of Care/Follow-up recommendations:   Activity: as tolerated  Diet: heart healthy  Other: -Follow-up with your outpatient psychiatric provider -instructions on appointment date, time, and address (location) are provided to you in discharge paperwork.  -Take your psychiatric medications as prescribed at discharge - instructions are provided to you in the discharge paperwork  -Follow-up with outpatient primary care doctor and other specialists -for management of chronic medical disease.  -Testing: Follow-up with outpatient provider for abnormal lab results:  Abnormal lipid panel   -Recommend abstinence from alcohol, tobacco, and other illicit drug use at discharge.   -If your psychiatric symptoms recur, worsen, or if you have side effects to your psychiatric medications, call your outpatient psychiatric provider, 911, [phone] or go to the nearest emergency department.  -If suicidal thoughts occur, call your outpatient psychiatric  provider, 911, [phone] or go to the nearest emergency department.   [phone], MD  12/08/2021, 8:32 AM

## 2021-12-08 NOTE — Progress Notes (Signed)
   12/08/21 0700  Sleep  Number of Hours 6.25

## 2021-12-08 NOTE — Progress Notes (Signed)
   12/08/21 0200  Psych Admission Type (Psych Patients Only)  Admission Status Voluntary  Psychosocial Assessment  Patient Complaints Anxiety;Depression;Hopelessness  Eye Contact Brief  Facial Expression Flat  Affect Sad  Speech Soft  Interaction Minimal  Motor Activity Slow  Appearance/Hygiene Disheveled  Behavior Characteristics Appropriate to situation  Mood Pleasant  Thought Process  Coherency Circumstantial  Content WDL  Delusions None reported or observed  Perception WDL  Hallucination None reported or observed  Judgment Poor  Confusion None  Danger to Self  Current suicidal ideation? Denies  Agreement Not to Harm Self Yes  Description of Agreement verbal contract  Danger to Others  Danger to Others None reported or observed

## 2021-12-08 NOTE — Discharge Summary (Signed)
Physician Discharge Summary Note  Patient:  Carol Schwartz is an 26 y.o., female MRN:  [id number] DOB:  February 19, 1996 Patient phone:  [phone] (home)  Patient address:   [place] [address],  Total Time spent with patient: 20 minutes  Date of Admission:  11/30/2021 Date of Discharge: 12-08-2021  Reason for Admission:   Patient is a 26 year old female who denies having any previously diagnosed psychiatric history, who was admitted to the psychiatric unit for evaluation of extreme anxiety and paranoia.  Patient was medically cleared at [facility].    Principal Problem: MDD (major depressive disorder), recurrent, severe, with psychosis ([place]) Discharge Diagnoses: Principal Problem:   MDD (major depressive disorder), recurrent, severe, with psychosis ([place]) Active Problems:   GAD (generalized anxiety disorder)   Past Psychiatric History:   She denies a previous psychiatric diagnosis.  Denies ever seeing a psychiatrist before (except at [facility]).  Denies ever being prescribed psychiatric medications before this admission.  Patient reports she was started on Prozac yesterday.  Denies any previous psychiatric hospitalization.  Denies any previous suicide attempt.  Past Medical History:  Past Medical History:  Diagnosis Date   Anxiety    GERD (gastroesophageal reflux disease)     Past Surgical History:  Procedure Laterality Date   LAPAROSCOPY  02/24/2012   Procedure: LAPAROSCOPY OPERATIVE;  Surgeon: Thurnell Lose, MD;  Location: [facility];  Service: Gynecology;  Laterality: N/A;  Drainage of Left Ovarian Cyst   OVARIAN CYST REMOVAL  02/24/2012   Procedure: OVARIAN CYSTECTOMY;  Surgeon: Thurnell Lose, MD;  Location: [facility];  Service: Gynecology;  Laterality: Right;   Family History:  Family History  Problem Relation Age of Onset   Allergic rhinitis Mother    Diabetes Father    Heart attack Father 26   Allergic rhinitis Brother    Hypertension Other    Family  Psychiatric  History:  Psychiatric illness: Denies.  Family history of suicide attempt: Denies.  Social History:  Social History   Substance and Sexual Activity  Alcohol Use No     Social History   Substance and Sexual Activity  Drug Use No    Social History   Socioeconomic History   Marital status: Single    Spouse name: Not on file   Number of children: 0   Years of education: Not on file   Highest education level: Not on file  Occupational History   Not on file  Tobacco Use   Smoking status: Never    Passive exposure: Never   Smokeless tobacco: Never  Vaping Use   Vaping Use: Never used  Substance and Sexual Activity   Alcohol use: No   Drug use: No   Sexual activity: Not Currently  Other Topics Concern   Not on file  Social History Narrative   Not on file   Social Determinants of Health   Financial Resource Strain: Not on file  Food Insecurity: Not on file  Transportation Needs: Not on file  Physical Activity: Not on file  Stress: Not on file  Social Connections: Not on file    Hospital Course:   During the patient's hospitalization, patient had extensive initial psychiatric evaluation, and follow-up psychiatric evaluations every day.   Psychiatric diagnoses provided upon initial assessment: -Major depressive disorder severe recurrent with psychotic features.  Per collateral in [place] note, brother states that patient has been severely depressed, which has resulted in her medical concerns.  On my assessment, it is unclear if the  paranoia surrounding her bodily symptoms are secondary to a mood episode, or are independent for mood episode.  Differential diagnosis list could also include delusional disorder versus premorbid psychotic disorder. -Somatic symptom disorder -GAD   Patient's psychiatric medications were adjusted on admission:  -Increase fluoxetine from 10 mg once daily to 20 mg once daily for MDD and GAD -Start Abilify 5 mg once daily for MDD and  paranoid symptoms.  We also consider starting Risperdal for the symptoms, but ultimately decided to start Abilify due to significant comorbid mood symptoms.   During the hospitalization, other adjustments were made to the patient's psychiatric medication regimen:  -Prozac increased to 40 mg once daily -Abilify increased to 10 mg once daily -Propranolol started and titrated to LA 60 mg once daily    Gradually, patient started adjusting to milieu.   Patient's care was discussed during the interdisciplinary team meeting every day during the hospitalization.   The patient denies having side effects to prescribed psychiatric medication.   The patient reports their target psychiatric symptoms of anxiety, paranoia, and excessive anxiety surrounding somatic symptoms, all responded well to the psychiatric medications, and the patient reports overall benefit other psychiatric hospitalization. Supportive psychotherapy was provided to the patient. The patient also participated in regular group therapy while admitted.    Labs were reviewed with the patient, and abnormal results were discussed with the patient.   The patient denied having suicidal thoughts more than 48 hours prior to discharge.  Patient denies having homicidal thoughts.  Patient denies having auditory hallucinations.  Patient denies any visual hallucinations.  Patient denies having paranoid thoughts.   The patient is able to verbalize their individual safety plan to this provider.   It is recommended to the patient to continue psychiatric medications as prescribed, after discharge from the hospital.     It is recommended to the patient to follow up with your outpatient psychiatric provider and PCP.   Discussed with the patient, the impact of alcohol, drugs, tobacco have been there overall psychiatric and medical wellbeing, and total abstinence from substance use was recommended the patient.      Physical Findings: AIMS: Facial and  Oral Movements Muscles of Facial Expression: None, normal Lips and Perioral Area: None, normal Jaw: None, normal Tongue: None, normal,Extremity Movements Upper (arms, wrists, hands, fingers): None, normal Lower (legs, knees, ankles, toes): None, normal, Trunk Movements Neck, shoulders, hips: None, normal, Overall Severity Severity of abnormal movements (highest score from questions above): None, normal Incapacitation due to abnormal movements: None, normal Patient's awareness of abnormal movements (rate only patient's report): No Awareness, Dental Status Current problems with teeth and/or dentures?: No Does patient usually wear dentures?: No  CIWA:    COWS:     Musculoskeletal: Strength & Muscle Tone: within normal limits Gait & Station: normal Patient leans: N/A  AIMS score zero on day of discharge.   Psychiatric Specialty Exam:  Presentation  General Appearance: Casual  Eye Contact:Good  Speech:Normal Rate  Speech Volume:Normal  Handedness:Right   Mood and Affect  Mood:Euthymic  Affect:Appropriate; Congruent; Full Range   Thought Process  Thought Processes:Linear  Descriptions of Associations:Intact  Orientation:Full (Time, Place and Person)  Thought Content:Logical  History of Schizophrenia/Schizoaffective disorder:No  Duration of Psychotic Symptoms:Less than six months  Hallucinations:Hallucinations: None  Ideas of Reference:None  Suicidal Thoughts:Suicidal Thoughts: No  Homicidal Thoughts:Homicidal Thoughts: No   Sensorium  Memory:Immediate Good; Recent Good; Remote Good  Judgment:Good  Insight:Good   Executive Functions  Concentration:Good  Attention Span:Good  [place]  Language:Good   Psychomotor Activity  Psychomotor Activity:Psychomotor Activity: Normal   Assets  Assets:Communication Skills; Physical Health; Social Support   Sleep  Sleep:Sleep: Good    Physical Exam: Physical  Exam Vitals reviewed.  Pulmonary:     Effort: Pulmonary effort is normal.  Neurological:     Motor: No weakness.     Gait: Gait normal.  Psychiatric:        Mood and Affect: Mood normal.        Behavior: Behavior normal.        Thought Content: Thought content normal.        Judgment: Judgment normal.   Review of Systems  Constitutional:  Negative for chills and fever.  Cardiovascular:  Negative for chest pain and palpitations.  Neurological:  Negative for dizziness, tingling, tremors and headaches.  Psychiatric/Behavioral:  Negative for depression, hallucinations, memory loss, substance abuse and suicidal ideas. The patient is not nervous/anxious and does not have insomnia.   All other systems reviewed and are negative. Blood pressure 103/77, pulse (!) 101, temperature 98.5 F (36.9 C), temperature source Oral, resp. rate 18, height 5\' 8"  (1.727 m), weight 92.1 kg, SpO2 100 %. Body mass index is 30.87 kg/m.   Social History   Tobacco Use  Smoking Status Never   Passive exposure: Never  Smokeless Tobacco Never   Tobacco Cessation:  N/A, patient does not currently use tobacco products   Blood Alcohol level:  Lab Results  Component Value Date   ETH <10 0000000    Metabolic Disorder Labs:  Lab Results  Component Value Date   HGBA1C 4.7 (L) 11/30/2021   MPG 88.19 11/30/2021   No results found for: PROLACTIN Lab Results  Component Value Date   CHOL 160 11/30/2021   TRIG 44 11/30/2021   HDL 26 (L) 11/30/2021   CHOLHDL 6.2 11/30/2021   VLDL 9 11/30/2021   LDLCALC 125 (H) 11/30/2021    See Psychiatric Specialty Exam and Suicide Risk Assessment completed by Attending Physician prior to discharge.  Discharge destination:  Home  Is patient on multiple antipsychotic therapies at discharge:  No   Has Patient had three or more failed trials of antipsychotic monotherapy by history:  No  Recommended Plan for Multiple Antipsychotic Therapies: NA  Discharge  Instructions     Diet - low sodium heart healthy   Complete by: As directed    Increase activity slowly   Complete by: As directed       Allergies as of 12/08/2021       Reactions   Shellfish Allergy         Medication List     TAKE these medications      Indication  ARIPiprazole 10 MG tablet Commonly known as: ABILIFY Take 1 tablet (10 mg total) by mouth daily. Start taking on: Dec 09, 2021  Indication: Major Depressive Disorder   diphenhydrAMINE 25 mg capsule Commonly known as: BENADRYL Take 25 mg by mouth every 6 (six) hours as needed for itching or allergies.  Indication: Skin Reaction due to an Allergy   FLUoxetine 40 MG capsule Commonly known as: PROZAC Take 1 capsule (40 mg total) by mouth daily. Start taking on: Dec 09, 2021 What changed:  medication strength how much to take  Indication: Depression   hydrocerin Crea Apply 1 application. topically 2 (two) times daily.  Indication: allergic skin reaction   hydrOXYzine 25 MG tablet Commonly known as: ATARAX Take 1 tablet (25 mg  total) by mouth every 8 (eight) hours as needed for up to 30 doses for anxiety.  Indication: Feeling Anxious   levocetirizine 5 MG tablet Commonly known as: XYZAL Take 1 tablet (5 mg total) by mouth every evening.  Indication: Perennial Allergic Rhinitis   norethindrone 5 MG tablet Commonly known as: AYGESTIN Take 5 mg by mouth daily.  Indication: dysmenorrhea   omeprazole 20 MG capsule Commonly known as: PRILOSEC Take 1 capsule (20 mg total) by mouth daily.  Indication: Heartburn   propranolol ER 60 MG 24 hr capsule Commonly known as: INDERAL LA Take 1 capsule (60 mg total) by mouth daily. Start taking on: Dec 09, 2021  Indication: Feeling Anxious, tachycardia   traZODone 50 MG tablet Commonly known as: DESYREL Take 1 tablet (50 mg total) by mouth at bedtime as needed for sleep.  Indication: Trouble Sleeping, Major Depressive Disorder   Ventolin HFA 108 (90  Base) MCG/ACT inhaler Generic drug: albuterol Inhale 2 puffs into the lungs every 4 (four) hours as needed.  Indication: Asthma        Follow-up [facility] Follow up on 12/09/2021.   Specialty: Behavioral Health Why: You have a walk in appointment for therapy and medication management on 12/09/21 at 7:30 am.  Services are provided on a first come, first served basis. Contact information: [place].   Specialty: Professional Counselor Why: You may also go to this provider for therapy and medication management services, on walk in days:  Monday and Wednesday, arrive at 7:30 am. Contact information: [place] [address] [phone]         Garnet Sierras, NP. Go on 12/10/2021.   Specialty: Adult Health Nurse Practitioner Why: You have a hospital follow up appointment with your primary care provider on 12/10/21 at 9:00 am with  Garnet Sierras, NP.  This appointment will be held in person Contact information: [place] [address] [phone]                 Follow-up recommendations:    Activity: as tolerated   Diet: heart healthy   Other: -Follow-up with your outpatient psychiatric provider -instructions on appointment date, time, and address (location) are provided to you in discharge paperwork.   -Take your psychiatric medications as prescribed at discharge - instructions are provided to you in the discharge paperwork   -Follow-up with outpatient primary care doctor and other specialists -for management of chronic medical disease.   -Testing: Follow-up with outpatient provider for abnormal lab results:  Abnormal lipid panel    -Recommend abstinence from alcohol, tobacco, and other illicit drug use at discharge.    -If your psychiatric symptoms recur, worsen, or if  you have side effects to your psychiatric medications, call your outpatient psychiatric provider, [phone] or go to the nearest emergency department.   -If suicidal thoughts occur, call your outpatient psychiatric provider, [phone] or go to the nearest emergency department.     Signed: Christoper Allegra, MD 12/08/2021, 8:30 AM   Total Time Spent in Direct Patient Care:  I personally spent 35 minutes on the unit in direct patient care. The direct patient care time included face-to-face time with the patient, reviewing the patient's chart, communicating with other professionals, and coordinating care. Greater than 50% of this time was spent in  counseling or coordinating care with the patient regarding goals of hospitalization, psycho-education, and discharge planning needs.   Janine Limbo, MD Psychiatrist

## 2021-12-10 ENCOUNTER — Ambulatory Visit (INDEPENDENT_AMBULATORY_CARE_PROVIDER_SITE_OTHER): Payer: Medicaid Other | Admitting: Allergy & Immunology

## 2021-12-10 ENCOUNTER — Encounter: Payer: Self-pay | Admitting: Allergy & Immunology

## 2021-12-10 VITALS — BP 128/86 | HR 88 | Temp 98.0°F | Resp 20 | Ht 70.0 in | Wt 213.2 lb

## 2021-12-10 DIAGNOSIS — F419 Anxiety disorder, unspecified: Secondary | ICD-10-CM

## 2021-12-10 DIAGNOSIS — L508 Other urticaria: Secondary | ICD-10-CM | POA: Diagnosis not present

## 2021-12-10 DIAGNOSIS — T7800XD Anaphylactic reaction due to unspecified food, subsequent encounter: Secondary | ICD-10-CM

## 2021-12-10 DIAGNOSIS — K219 Gastro-esophageal reflux disease without esophagitis: Secondary | ICD-10-CM

## 2021-12-10 NOTE — Patient Instructions (Addendum)
Chronic idiopathic urticaria - I think you just have chronic hives without a cause.  - This is the most common cause of hives.  - I would strongly consider starting Xolair for management of the hives. - I would continue with the Xyzal 5mg  twice daily. - Add on Pepcid (famotidine) 40mg  twice daily to help suppress the hives. - I think we can stop some of these medications as the Xolair starts working.  2. Environmental allergies (dust mite, cockroach, trees, weeds, ragweed, and one indoor mold) - The antihistamines should help with this.   3. Return in about 6 months (around 06/11/2022).    Please inform of any Emergency Department visits, hospitalizations, or changes in symptoms. Call 14/07/2021 before going to the ED for breathing or allergy symptoms since we might be able to fit you in for a sick visit. Feel free to contact us anytime with any questions, problems, or concerns.  It was a pleasure to see you agai today!  Websites that have reliable patient information: 1. American Academy of Asthma, Allergy, and Immunology: www.aaaai.org 2. Food Allergy Research and Education (FARE): foodallergy.org 3. Mothers of Asthmatics: http://www.asthmacommunitynetwork.org 4. American College of Allergy, Asthma, and Immunology: www.acaai.org   COVID-19 Vaccine Information can be found at: [place] For questions related to vaccine distribution or appointments, please email [email or web address] or call [phone].   We realize that you might be concerned about having an allergic reaction to the COVID19 vaccines. To help with that concern, WE ARE OFFERING THE COVID19 VACCINES IN OUR OFFICE! Ask the front desk for dates!     "Like" [email or web address] on Facebook and Instagram for our latest updates!      A healthy democracy works best when [phone] participate! Make sure you are registered to vote! If you have moved or changed any of your  contact information, you will need to get this updated before voting!  In some cases, you MAY be able to register to vote online: [place]

## 2021-12-10 NOTE — Progress Notes (Signed)
FOLLOW UP  Date of Service/Encounter:  12/10/21   Assessment:   Concern for an allergic reaction   Perennial and seasonal allergic rhinitis (dust mite, cockroach, trees, weeds, ragweed, and one indoor mold)   Shortness of breath - sleep study pending   History of anxiety   GERD like symptoms - s/p endoscopy   I think I finally convinced her that a lot of her reactions are secondary to CIU. I explained that we often do not find a cause of these reactions. We have done extensive blood work - likely more on her than I have ever ordered on anyone else - without an obvious cause. I do not think that additional food allergy testing is going to be useful at this point in time. She is amenable to starting Xolair and she signed consent today.   Plan/Recommendations:   Chronic idiopathic urticaria - I think you just have chronic hives without a cause.  - This is the most common cause of hives.  - I would strongly consider starting Xolair for management of the hives. - I would continue with the Xyzal 5mg  twice daily. - Add on Pepcid (famotidine) 40mg  twice daily to help suppress the hives. - I think we can stop some of these medications as the Xolair starts working.  2. Environmental allergies (dust mite, cockroach, trees, weeds, ragweed, and one indoor mold) - The antihistamines should help with this.   3. Return in about 6 months (around 06/11/2022).    Subjective:   Carol Schwartz is a 26 y.o. female presenting today for follow up of  Chief Complaint  Patient presents with  . Allergic Reaction    Has had random reactions to foods. Starts to itch and may break out in hives. Has been tested on foods and it came back negative but is having reactions.     Carol Schwartz has a history of the following: Patient Active Problem List   Diagnosis Date Noted  . MDD (major depressive disorder), recurrent, severe, with psychosis ([facility]) 12/01/2021  . GAD (generalized anxiety disorder)  12/01/2021  . Ovarian cyst 11/13/2020  . Dysmenorrhea 11/13/2020    History obtained from: chart review and patient.  Carol Schwartz is a 26 y.o. female presenting for a follow up visit. She has a long standing his   Since the last ivsit, she has done well. She lost 70 pounds. She was already a vegan diet.   She rpeort sthat for the past monht, she has been ahving reactions to foods and evinonrmentals. She starts itching and breaking.   She talked to her psychiatrist nad her medications were changed. She was placed on some anxiety meidcations .  Crab shrimp and lboster were positive.   {Blank single:19197::"Asthma/Respiratory Symptom History: ***"," "}  {Blank single:19197::"Allergic Rhinitis Symptom History: ***"," "}  {Blank single:19197::"Food Allergy Symptom History: ***"," "}  {Blank single:19197::"Skin Symptom History: ***"," "}  {Blank single:19197::"GERD Symptom History: ***"," "}  Otherwise, there have been no changes to her past medical history, surgical history, family history, or social history.    ROS     Objective:   Blood pressure 128/86, pulse 88, temperature 98 F (36.7 C), resp. rate 20, height 5\' 10"  (1.778 m), weight 213 lb 4 oz (96.7 kg), SpO2 99 %. Body mass index is 30.6 kg/m.    Physical Exam   Diagnostic studies: {Blank single:19197::"none","deferred due to recent antihistamine use","labs sent instead"," "}  Spirometry: {Blank single:19197::"results normal (FEV1: ***%, FVC: ***%, FEV1/FVC: ***%)","results abnormal (FEV1: ***%,  FVC: ***%, FEV1/FVC: ***%)"}.    {Blank single:19197::"Spirometry consistent with mild obstructive disease","Spirometry consistent with moderate obstructive disease","Spirometry consistent with severe obstructive disease","Spirometry consistent with possible restrictive disease","Spirometry consistent with mixed obstructive and restrictive disease","Spirometry uninterpretable due to technique","Spirometry consistent with  normal pattern"}. {Blank single:19197::"Albuterol/Atrovent nebulizer","Xopenex/Atrovent nebulizer","Albuterol nebulizer","Albuterol four puffs via MDI","Xopenex four puffs via MDI"} treatment given in clinic with {Blank single:19197::"significant improvement in FEV1 per ATS criteria","significant improvement in FVC per ATS criteria","significant improvement in FEV1 and FVC per ATS criteria","improvement in FEV1, but not significant per ATS criteria","improvement in FVC, but not significant per ATS criteria","improvement in FEV1 and FVC, but not significant per ATS criteria","no improvement"}.  Allergy Studies: {Blank single:19197::"none","labs sent instead"," "}    {Blank single:19197::"Allergy testing results were read and interpreted by myself, documented by clinical staff."," "}      Carol Bonds, MD  [facility]

## 2021-12-11 ENCOUNTER — Encounter: Payer: Self-pay | Admitting: Allergy & Immunology

## 2021-12-11 MED ORDER — FAMOTIDINE 40 MG PO TABS: 40.0000 mg | ORAL_TABLET | Freq: Two times a day (BID) | ORAL | 5 refills | Status: AC

## 2021-12-11 MED ORDER — LEVOCETIRIZINE DIHYDROCHLORIDE 5 MG PO TABS: 5.0000 mg | ORAL_TABLET | Freq: Every evening | ORAL | 5 refills | Status: AC

## 2021-12-11 NOTE — Addendum Note (Signed)
Addended by: Alfonse Spruce on: 12/11/2021 09:01 AM   Modules accepted: Orders

## 2021-12-14 ENCOUNTER — Telehealth: Payer: Self-pay | Admitting: *Deleted

## 2021-12-14 NOTE — Telephone Encounter (Signed)
Patient Ins denied Xolair for urticaria due to no trial and failure of Montelukast. Criteria is trial and failure of 2 antihistamines and leukotriene modifier. Please advise

## 2021-12-14 NOTE — Telephone Encounter (Signed)
-----   Message from Alfonse Spruce, MD sent at 12/11/2021  9:00 AM EDT ----- New start Xolair for CIU. She signed consent.

## 2021-12-15 NOTE — Telephone Encounter (Signed)
We can add that. She has a history of depression, so I was hesitant to add montelukast.   Can you call the patient to let her know the plan? Would she call back in one month?   Also please confirm the pharmacy and sign on my behalf. THANK YOU!   Malachi Bonds, MD [facility]

## 2021-12-15 NOTE — Addendum Note (Signed)
Addended by: Alfonse Spruce on: 12/15/2021 06:11 PM   Modules accepted: Orders

## 2021-12-16 MED ORDER — MONTELUKAST SODIUM 10 MG PO TABS: 10.0000 mg | ORAL_TABLET | Freq: Every day | ORAL | 5 refills | Status: AC

## 2021-12-16 NOTE — Telephone Encounter (Signed)
Patient advised of Rx and instructions and will followup with call in 4 weeks with any benefit from adding montelukast

## 2021-12-16 NOTE — Addendum Note (Signed)
Addended by: Devoria Glassing on: 12/16/2021 09:06 AM   Modules accepted: Orders

## 2021-12-17 NOTE — Telephone Encounter (Signed)
Thanks, Tammy!   Lerline Valdivia, MD [facility]  

## 2021-12-21 ENCOUNTER — Telehealth: Payer: Self-pay | Admitting: Allergy & Immunology

## 2021-12-21 NOTE — Telephone Encounter (Signed)
Carol Schwartz called in and states her insurance qualifies her to receive a hepa filter free of charge but she needs authorization from Dr. Dellis Anes.  Carol Schwartz would like an authorization from Dr. Dellis Anes so she may receive a hepa filter.  Please advise.

## 2021-12-22 NOTE — Telephone Encounter (Signed)
I do not know how to do this. [place] [employer] - do you have an idea on how to go about doing this?   Malachi Bonds, MD [facility]

## 2021-12-22 NOTE — Telephone Encounter (Signed)
Spoken to patient and she will print the form off her insurance web site. Inform patient to please fill out her section and Dr Ernst Bowler will fill out the provider section.

## 2022-01-22 ENCOUNTER — Ambulatory Visit: Payer: Medicaid Other | Admitting: Cardiology

## 2022-02-22 ENCOUNTER — Ambulatory Visit: Payer: Medicaid Other | Admitting: Cardiology

## 2022-05-02 IMAGING — DX DG CHEST 2V
2 series · 2 of 2 positions shown · non-contrast
Comparison: None.

CLINICAL DATA: Shortness of breath, palpitations

EXAM:
CHEST - 2 VIEW

[w chest pa]
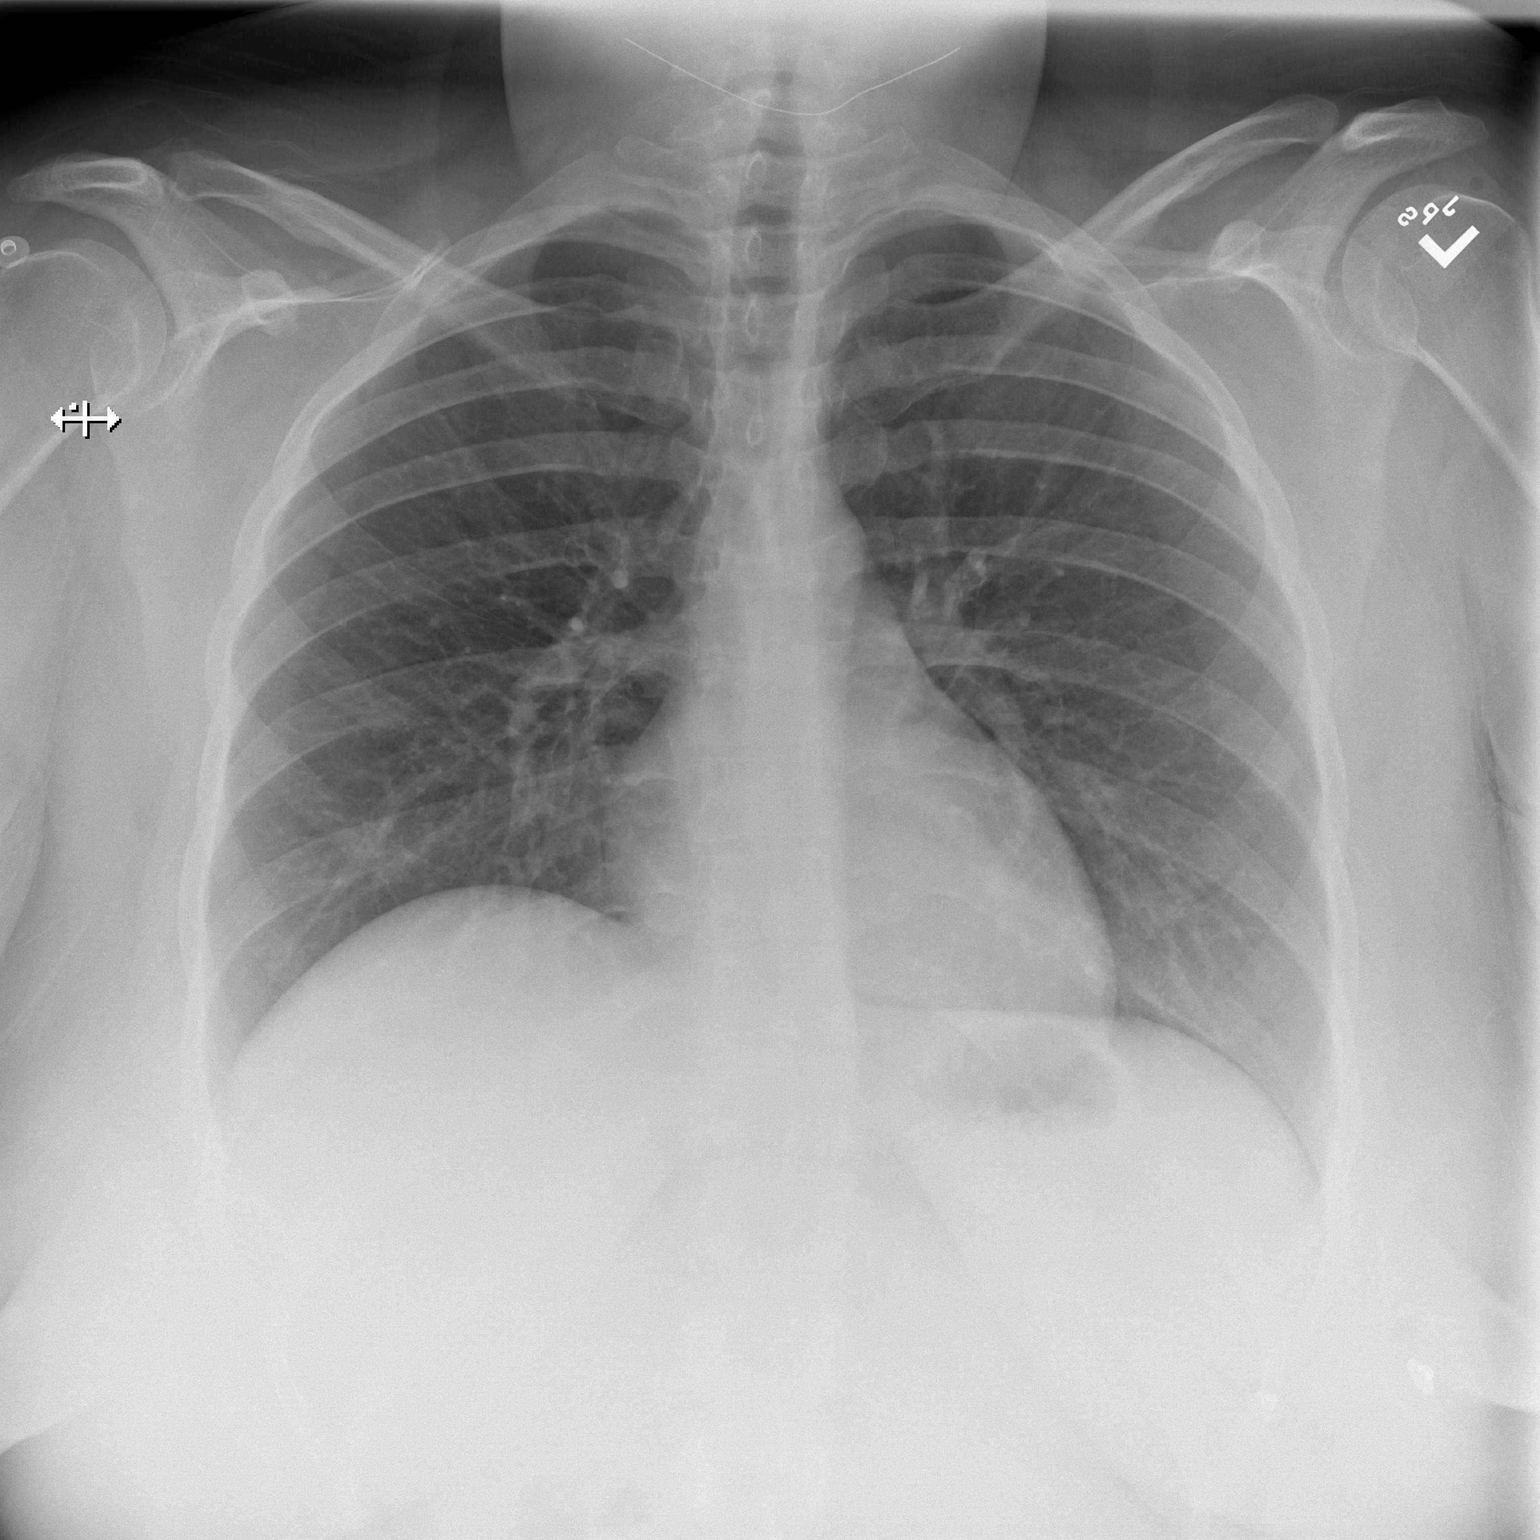

[w chest lat]
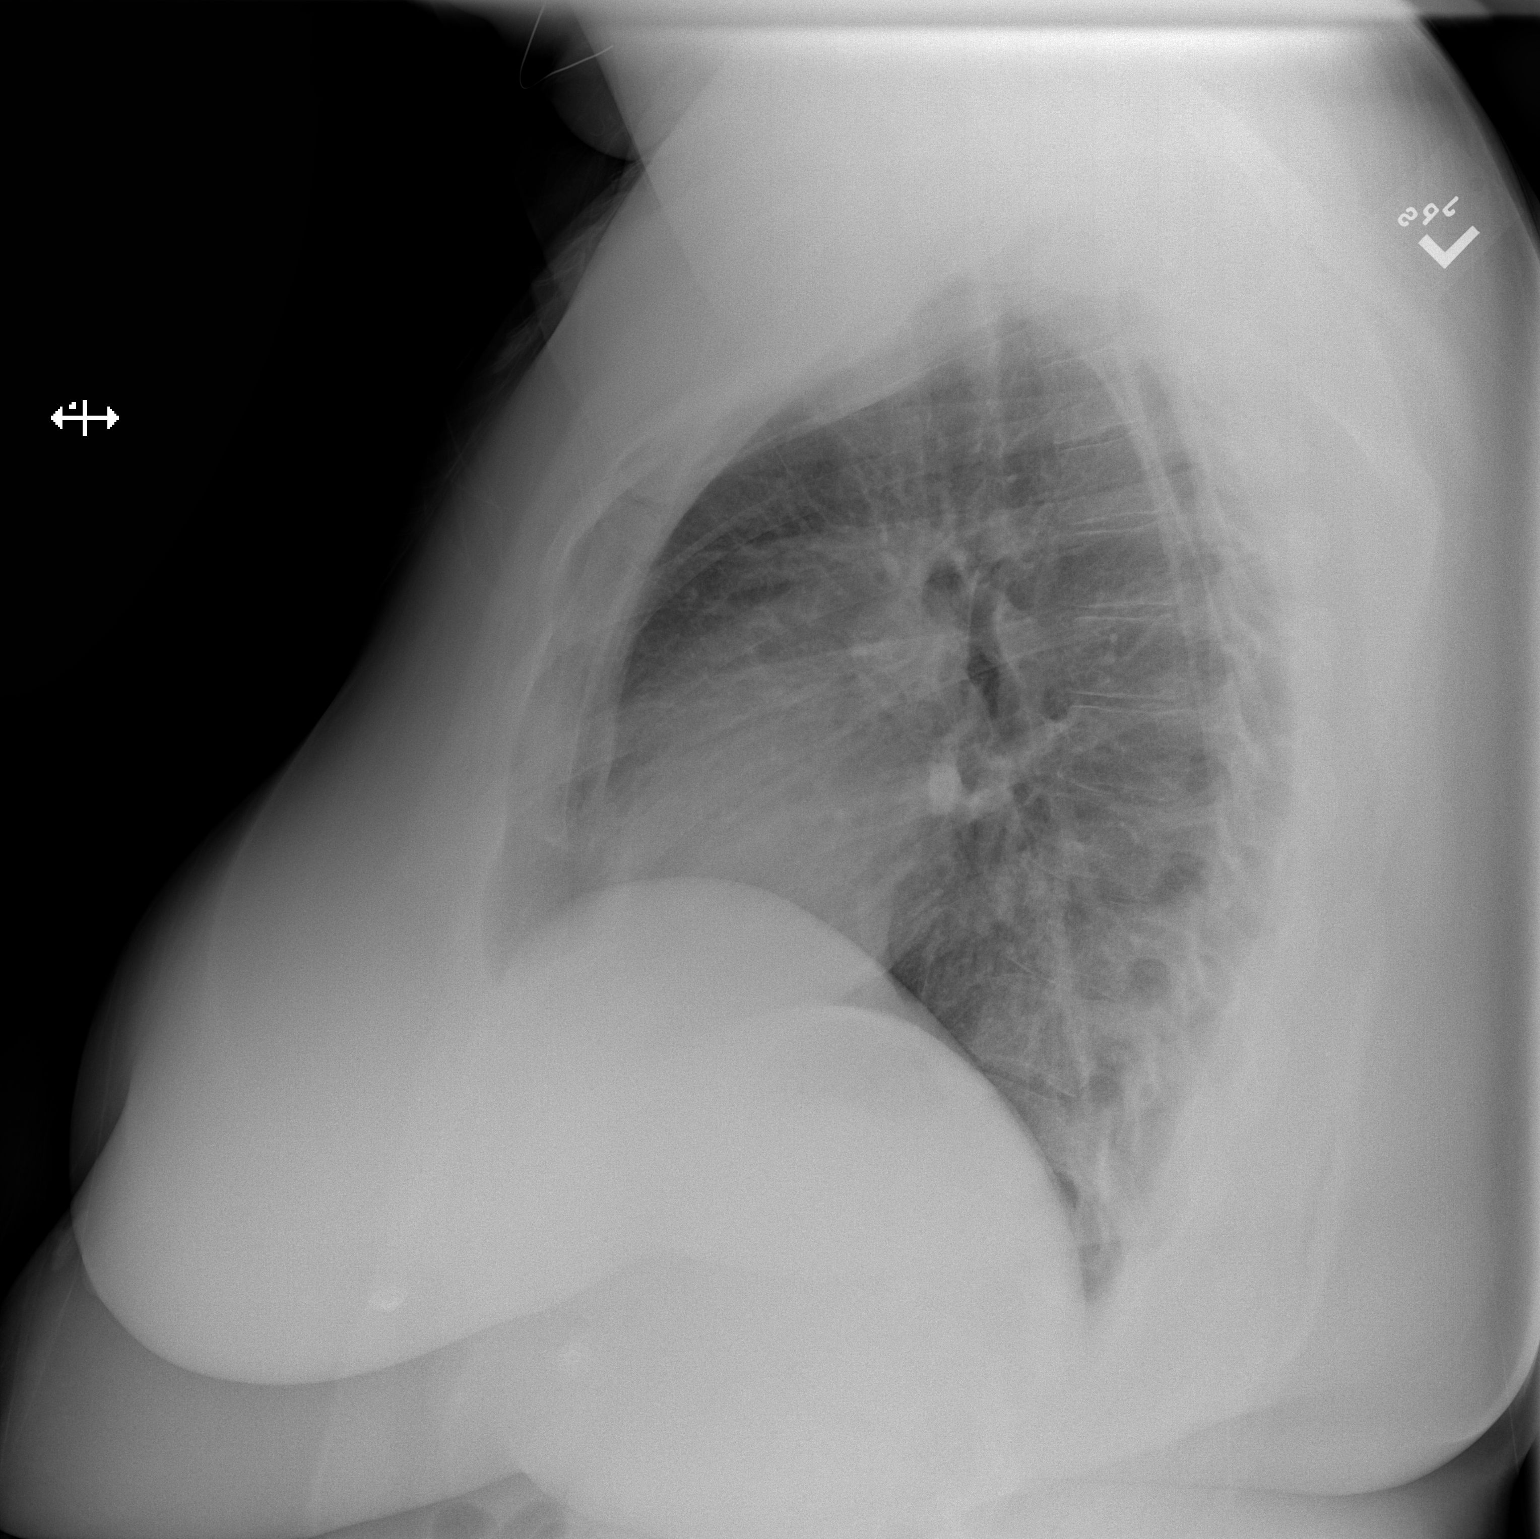

[2 of 2 positions shown; findings below may reference images not displayed]

FINDINGS: The heart size and mediastinal contours are within normal limits.
Both lungs are clear. The visualized skeletal structures are
unremarkable.
IMPRESSION: No active cardiopulmonary disease.

Reading location: [place], [place].

## 2022-06-08 IMAGING — CR DG CHEST 2V
2 series · 2 of 2 positions shown · non-contrast
Comparison: None.

CLINICAL DATA: 06/13/2021

EXAM:
CHEST - 2 VIEW

[chest pa]
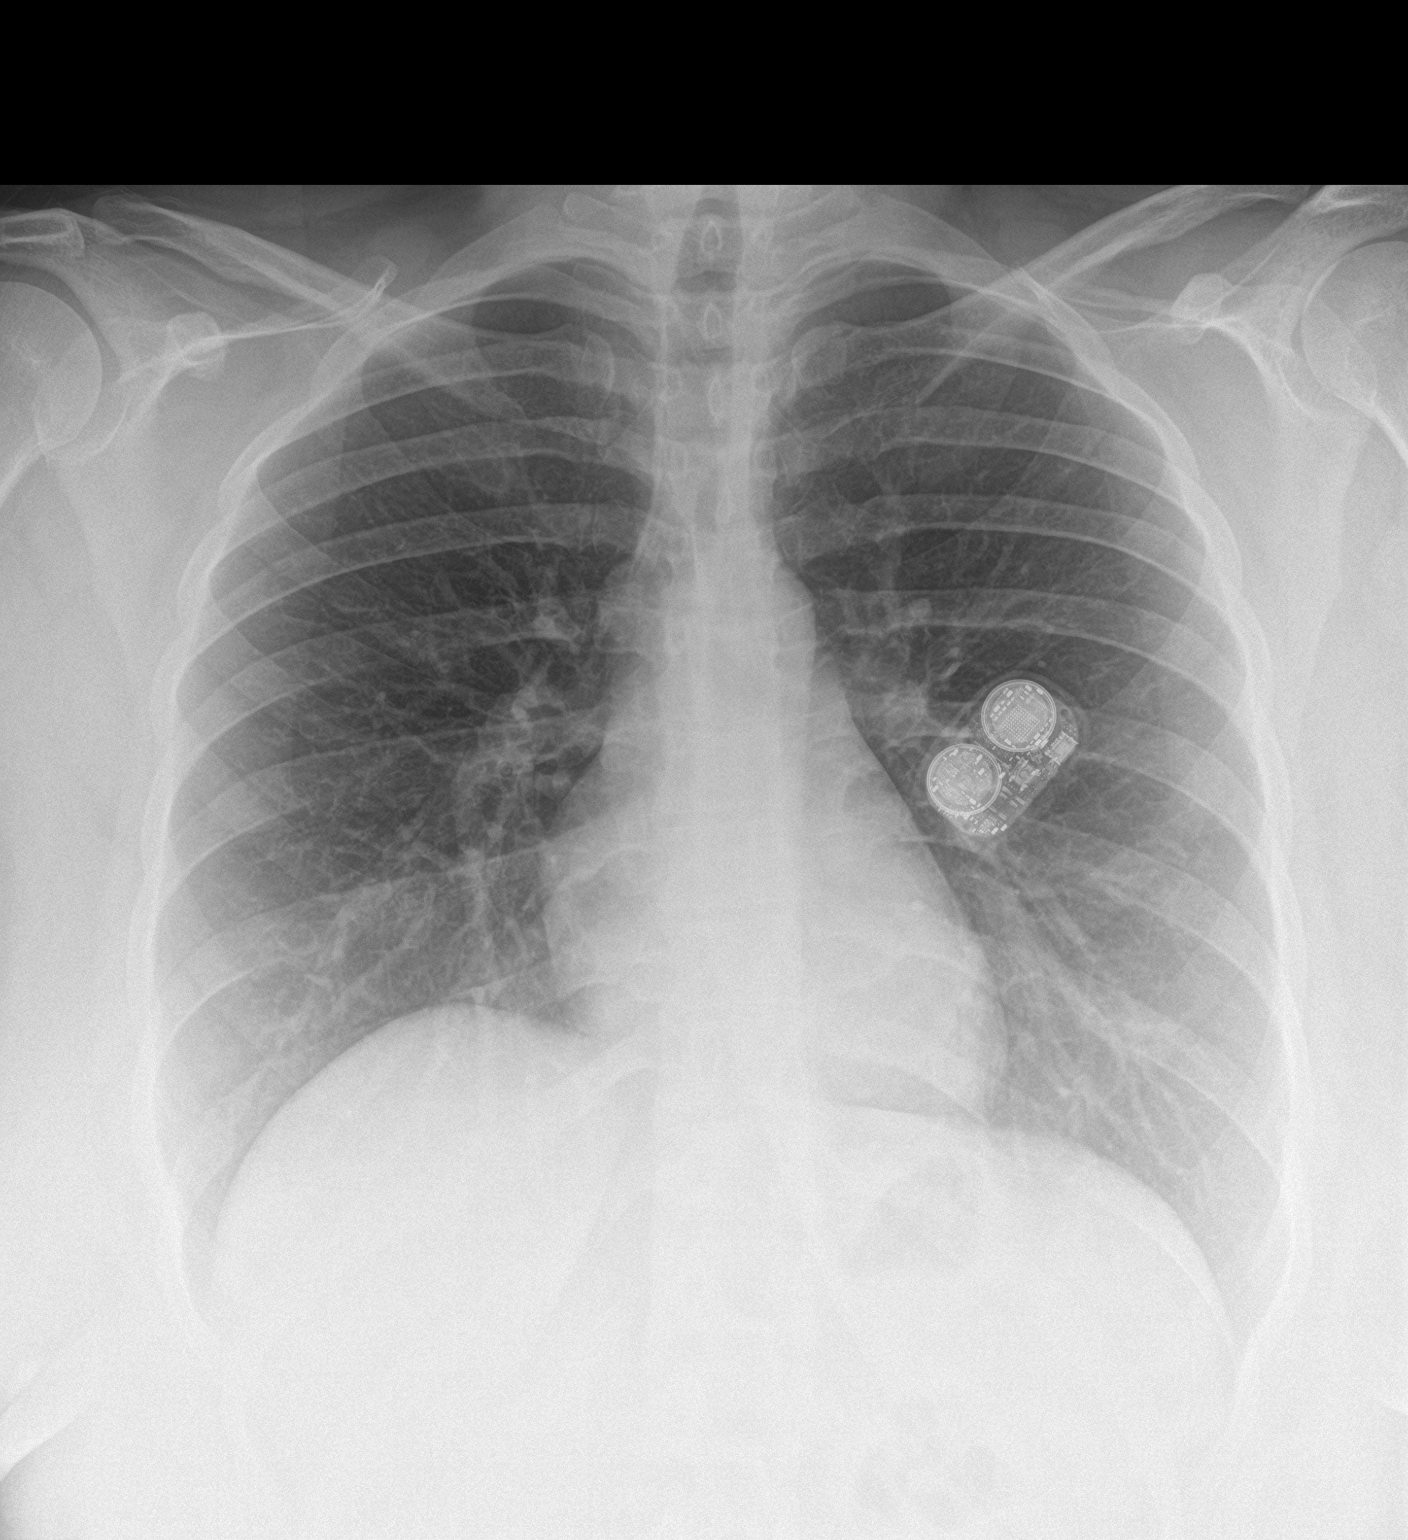

[chest lat]
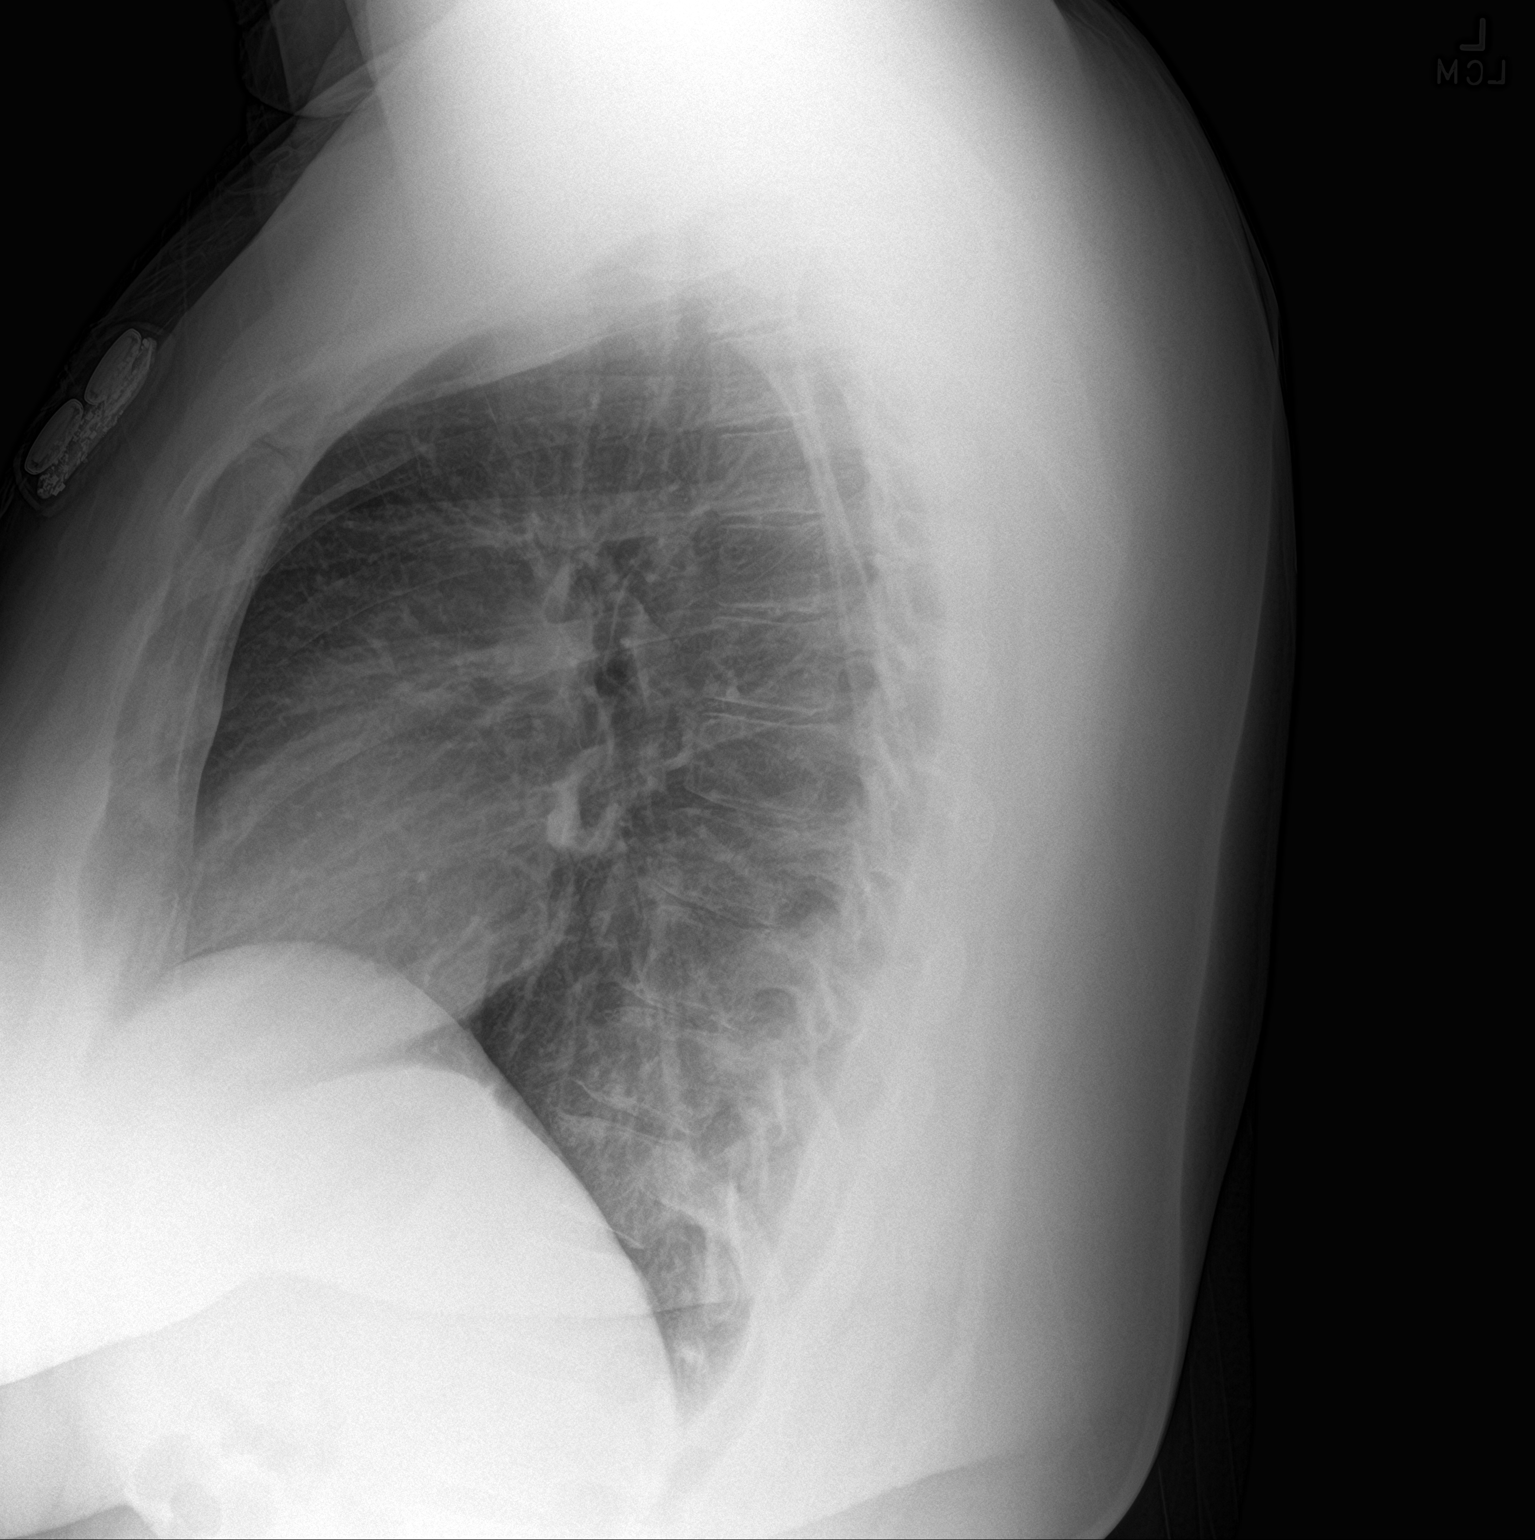

[2 of 2 positions shown; findings below may reference images not displayed]

FINDINGS: Lungs are well expanded, symmetric, and clear. No pneumothorax or
pleural effusion. Cardiac size within normal limits. Ambulatory
[place] monitor overlies the left hemithorax. Pulmonary vascularity
is normal. Osseous structures are age-appropriate. No acute bone
abnormality.
IMPRESSION: No active cardiopulmonary disease.

## 2022-06-15 ENCOUNTER — Ambulatory Visit: Payer: Medicaid Other | Admitting: Allergy & Immunology

## 2024-04-26 ENCOUNTER — Encounter: Payer: Self-pay | Admitting: Physician Assistant

## 2024-05-13 ENCOUNTER — Emergency Department (HOSPITAL_BASED_OUTPATIENT_CLINIC_OR_DEPARTMENT_OTHER)
Admission: EM | Admit: 2024-05-13 | Discharge: 2024-05-13 | Disposition: A | Discharge status: Home / Self Care | Payer: MEDICAID | Attending: Emergency Medicine | Admitting: Emergency Medicine

## 2024-05-13 ENCOUNTER — Emergency Department (HOSPITAL_BASED_OUTPATIENT_CLINIC_OR_DEPARTMENT_OTHER): Payer: MEDICAID

## 2024-05-13 ENCOUNTER — Other Ambulatory Visit: Payer: Self-pay

## 2024-05-13 DIAGNOSIS — R1031 Right lower quadrant pain: Secondary | ICD-10-CM | POA: Diagnosis present

## 2024-05-13 DIAGNOSIS — R739 Hyperglycemia, unspecified: Secondary | ICD-10-CM | POA: Insufficient documentation

## 2024-05-13 DIAGNOSIS — D72829 Elevated white blood cell count, unspecified: Secondary | ICD-10-CM | POA: Insufficient documentation

## 2024-05-13 DIAGNOSIS — K8080 Other cholelithiasis without obstruction: Secondary | ICD-10-CM

## 2024-05-13 LAB — PREGNANCY, URINE: Preg Test, Ur: NEGATIVE

## 2024-05-13 LAB — COMPREHENSIVE METABOLIC PANEL WITH GFR
ALT: 19 U/L (ref 0–44)
AST: 21 U/L (ref 15–41)
Albumin: 3.8 g/dL (ref 3.5–5.0)
Alkaline Phosphatase: 130 U/L — ABNORMAL HIGH (ref 38–126)
Anion gap: 10 (ref 5–15)
BUN: 10 mg/dL (ref 6–20)
CO2: 21 mmol/L — ABNORMAL LOW (ref 22–32)
Calcium: 8.2 mg/dL — ABNORMAL LOW (ref 8.9–10.3)
Chloride: 107 mmol/L (ref 98–111)
Creatinine, Ser: 0.7 mg/dL (ref 0.44–1.00)
GFR, Estimated: >60 mL/min (ref >60)
Glucose, Bld: 181 mg/dL — ABNORMAL HIGH (ref 70–99)
Potassium: 4.1 mmol/L (ref 3.5–5.1)
Sodium: 137 mmol/L (ref 135–145)
Total Bilirubin: 0.3 mg/dL (ref 0.0–1.2)
Total Protein: 7 g/dL (ref 6.5–8.1)

## 2024-05-13 LAB — LIPASE, BLOOD: Lipase: 21 U/L (ref 11–51)

## 2024-05-13 LAB — URINALYSIS, ROUTINE W REFLEX MICROSCOPIC
Bilirubin Urine: NEGATIVE
Glucose, UA: NEGATIVE mg/dL
Hgb urine dipstick: NEGATIVE
Ketones, ur: NEGATIVE mg/dL
Leukocytes,Ua: NEGATIVE
Nitrite: NEGATIVE
Protein, ur: NEGATIVE mg/dL
Specific Gravity, Urine: 1.025 (ref 1.005–1.030)
pH: 6.5 (ref 5.0–8.0)

## 2024-05-13 LAB — CBC
HCT: 41.4 % (ref 36.0–46.0)
Hemoglobin: 13.5 g/dL (ref 12.0–15.0)
MCH: 30.3 pg (ref 26.0–34.0)
MCHC: 32.6 g/dL (ref 30.0–36.0)
MCV: 92.8 fL (ref 80.0–100.0)
Platelets: 265 K/uL (ref 150–400)
RBC: 4.46 MIL/uL (ref 3.87–5.11)
RDW: 14.2 % (ref 11.5–15.5)
WBC: 12.5 K/uL — ABNORMAL HIGH (ref 4.0–10.5)
nRBC: 0 % (ref 0.0–0.2)

## 2024-05-13 MED ORDER — OXYCODONE-ACETAMINOPHEN 5-325 MG PO TABS
1.0000 | ORAL_TABLET | ORAL | Status: DC | PRN
  Administered 2024-05-13: 1 via ORAL
  Filled 2024-05-13: qty 1

## 2024-05-13 MED ORDER — SODIUM CHLORIDE 0.9 % IV BOLUS
1000.0000 mL | Freq: Once | INTRAVENOUS | Status: AC
  Administered 2024-05-13: 1000 mL via INTRAVENOUS

## 2024-05-13 MED ORDER — IOHEXOL 300 MG/ML  SOLN
100.0000 mL | Freq: Once | INTRAMUSCULAR | Status: AC | PRN
  Administered 2024-05-13: 100 mL via INTRAVENOUS

## 2024-05-13 NOTE — ED Triage Notes (Signed)
 Pt POV reporting RLQ abd pain that radiates to R flank, began around 2000, denies n/v, tearful in triage.

## 2024-05-13 NOTE — ED Provider Notes (Signed)
 Patient signed out to me by Dr. Trine pending results of CT scan which showed no evidence of appendicitis possible gallbladder issues.  Will refer to general surgery.  Patient currently pain-free   Carol Faden, MD 05/13/24 [phone]

## 2024-05-13 NOTE — ED Provider Notes (Signed)
 [place] EMERGENCY DEPARTMENT AT [facility] Provider Note  CSN: [id number] Arrival date & time: 05/13/24 9847  Chief Complaint(s) Flank Pain  HPI Carol Schwartz is a 28 y.o. female with a past medical history listed below who presents to the emergency department with generalized abdominal pain and right flank pain after eating dinner last night.  She denies any associated nausea or vomiting.  No diarrhea.  No urinary symptoms.  Pain has been actually worsening.  The history is provided by the patient.    Past Medical History Past Medical History:  Diagnosis Date   Anxiety    GERD (gastroesophageal reflux disease)    Patient Active Problem List   Diagnosis Date Noted   MDD (major depressive disorder), recurrent, severe, with psychosis (HCC) 12/01/2021   GAD (generalized anxiety disorder) 12/01/2021   Ovarian cyst 11/13/2020   Dysmenorrhea 11/13/2020   Home Medication(s) Prior to Admission medications   Medication Sig Start Date End Date Taking? Authorizing Provider  ARIPiprazole  (ABILIFY ) 10 MG tablet Take 1 tablet (10 mg total) by mouth daily. 12/09/21 01/08/22  Massengill, Rankin, MD  diphenhydrAMINE  (BENADRYL ) 25 mg capsule Take 25 mg by mouth every 6 (six) hours as needed for itching or allergies.    [provider]  famotidine  (PEPCID ) 40 MG tablet Take 1 tablet (40 mg total) by mouth 2 (two) times daily. 12/11/21   Iva Marty Saltness, MD  FLUoxetine  (PROZAC ) 40 MG capsule Take 1 capsule (40 mg total) by mouth daily. 12/09/21 01/08/22  Massengill, Nathan, MD  hydrocerin (EUCERIN) CREA Apply 1 application. topically 2 (two) times daily. 12/08/21   Massengill, Nathan, MD  levocetirizine (XYZAL ) 5 MG tablet Take 1 tablet (5 mg total) by mouth every evening. 12/11/21   Iva Marty Saltness, MD  montelukast  (SINGULAIR ) 10 MG tablet Take 1 tablet (10 mg total) by mouth at bedtime. 12/16/21   Iva Marty Saltness, MD  norethindrone  (AYGESTIN ) 5 MG tablet Take 5 mg by  mouth daily. 10/29/20   [provider]  omeprazole  (PRILOSEC) 20 MG capsule Take 1 capsule (20 mg total) by mouth daily. 08/04/21   Iva Marty Saltness, MD  propranolol  ER (INDERAL  LA) 60 MG 24 hr capsule Take 1 capsule (60 mg total) by mouth daily. 12/09/21 01/08/22  Massengill, Rankin, MD                                                                                                                                    Allergies Shellfish allergy   Review of Systems Review of Systems As noted in HPI  Physical Exam Vital Signs  I have reviewed the triage vital signs BP 131/88 (BP Location: Right Arm)   Pulse 88   Temp 98 F (36.7 C) (Oral)   Resp 17   SpO2 100%   Physical Exam Vitals reviewed.  Constitutional:      General: She is not in acute distress.    Appearance:  She is well-developed. She is obese. She is not diaphoretic.  HENT:     Head: Normocephalic and atraumatic.     Right Ear: External ear normal.     Left Ear: External ear normal.     Nose: Nose normal.  Eyes:     General: No scleral icterus.    Conjunctiva/sclera: Conjunctivae normal.  Neck:     Trachea: Phonation normal.  Cardiovascular:     Rate and Rhythm: Normal rate and regular rhythm.  Pulmonary:     Effort: Pulmonary effort is normal. No respiratory distress.     Breath sounds: No stridor.  Abdominal:     General: There is no distension.     Tenderness: There is abdominal tenderness in the right lower quadrant and suprapubic area. There is right CVA tenderness.  Musculoskeletal:        General: Normal range of motion.     Cervical back: Normal range of motion.  Neurological:     Mental Status: She is alert and oriented to person, place, and time.  Psychiatric:        Behavior: Behavior normal.     ED Results and Treatments Labs (all labs ordered are listed, but only abnormal results are displayed) Labs Reviewed  COMPREHENSIVE METABOLIC PANEL WITH GFR - Abnormal; Notable for the  following components:      Result Value   CO2 21 (*)    Glucose, Bld 181 (*)    Calcium 8.2 (*)    Alkaline Phosphatase 130 (*)    All other components within normal limits  CBC - Abnormal; Notable for the following components:   WBC 12.5 (*)    All other components within normal limits  LIPASE, BLOOD  URINALYSIS, ROUTINE W REFLEX MICROSCOPIC  PREGNANCY, URINE                                                                                                                         EKG  EKG Interpretation Date/Time:    Ventricular Rate:    PR Interval:    QRS Duration:    QT Interval:    QTC Calculation:   R Axis:      Text Interpretation:         Radiology No results found.  Medications Ordered in ED Medications  oxyCODONE -acetaminophen  (PERCOCET/ROXICET) 5-325 MG per tablet 1 tablet (1 tablet Oral Given 05/13/24 0226)   Procedures Procedures  (including critical care time) Medical Decision Making / ED Course   Medical Decision Making Amount and/or Complexity of Data Reviewed Labs: ordered. Decision-making details documented in ED Course. Radiology: ordered.  Risk Prescription drug management.    Suprapubic, right lower quadrant and flank pain differential diagnosis considered.  Workup below. CBC with leukocytosis.  No anemia.  CMP without significant electrolyte derangements.  Hyperglycemia without DKA.  No renal insufficiency.  Mildly elevated alk phos without transaminitis or evidence of bili obstruction.  Lipase not concerning for pancreatitis.  Currently awaiting UA and UPT  to assess for UTI and pregnancy related process.  Will obtain a CT scan to rule out appendicitis.  Patient care turned over to oncoming provider. Patient case and results discussed in detail; please see their note for further ED managment.       Final Clinical Impression(s) / ED Diagnoses Final diagnoses:  None    This chart was dictated using voice recognition software.   Despite best efforts to proofread,  errors can occur which can change the documentation meaning.    Trine Raynell Moder, MD 05/13/24 [phone]
